# Patient Record
Sex: Male | Born: 1945 | Race: White | Hispanic: No | Marital: Married | Smoking: Former smoker
Health system: Southern US, Community
[De-identification: ages and names within clinical notes are randomized; demographics above are authoritative.]

## PROBLEM LIST (undated history)

## (undated) DIAGNOSIS — H409 Unspecified glaucoma: Secondary | ICD-10-CM

## (undated) DIAGNOSIS — Z8679 Personal history of other diseases of the circulatory system: Secondary | ICD-10-CM

## (undated) DIAGNOSIS — F419 Anxiety disorder, unspecified: Secondary | ICD-10-CM

## (undated) DIAGNOSIS — I10 Essential (primary) hypertension: Secondary | ICD-10-CM

## (undated) DIAGNOSIS — M1990 Unspecified osteoarthritis, unspecified site: Secondary | ICD-10-CM

## (undated) HISTORY — DX: Unspecified glaucoma: H40.9

## (undated) HISTORY — DX: Personal history of other diseases of the circulatory system: Z86.79

## (undated) HISTORY — DX: Unspecified osteoarthritis, unspecified site: M19.90

---

## 1998-07-23 ENCOUNTER — Emergency Department (HOSPITAL_COMMUNITY): Admission: EM | Admit: 1998-07-23 | Discharge: 1998-07-23 | Payer: Self-pay | Admitting: Ophthalmology

## 1998-07-23 ENCOUNTER — Encounter: Payer: Self-pay | Admitting: Emergency Medicine

## 1998-08-06 ENCOUNTER — Ambulatory Visit (HOSPITAL_COMMUNITY): Admission: RE | Admit: 1998-08-06 | Discharge: 1998-08-06 | Payer: Self-pay | Admitting: Family Medicine

## 2001-06-12 ENCOUNTER — Emergency Department (HOSPITAL_COMMUNITY): Admission: EM | Admit: 2001-06-12 | Discharge: 2001-06-12 | Payer: Self-pay

## 2001-12-18 ENCOUNTER — Inpatient Hospital Stay (HOSPITAL_COMMUNITY): Admission: EM | Admit: 2001-12-18 | Discharge: 2001-12-19 | Payer: Self-pay | Admitting: Emergency Medicine

## 2001-12-18 ENCOUNTER — Encounter: Payer: Self-pay | Admitting: Emergency Medicine

## 2002-01-02 ENCOUNTER — Encounter: Admission: RE | Admit: 2002-01-02 | Discharge: 2002-01-02 | Payer: Self-pay | Admitting: Family Medicine

## 2002-04-04 HISTORY — PX: KNEE ARTHROSCOPY: SUR90

## 2002-04-04 HISTORY — PX: RETINAL DETACHMENT SURGERY: SHX105

## 2002-08-25 ENCOUNTER — Emergency Department (HOSPITAL_COMMUNITY): Admission: EM | Admit: 2002-08-25 | Discharge: 2002-08-25 | Payer: Self-pay | Admitting: Emergency Medicine

## 2002-08-26 ENCOUNTER — Encounter: Payer: Self-pay | Admitting: Ophthalmology

## 2002-08-26 ENCOUNTER — Ambulatory Visit (HOSPITAL_COMMUNITY): Admission: RE | Admit: 2002-08-26 | Discharge: 2002-08-27 | Payer: Self-pay | Admitting: Otolaryngology

## 2002-09-03 ENCOUNTER — Ambulatory Visit (HOSPITAL_COMMUNITY): Admission: RE | Admit: 2002-09-03 | Discharge: 2002-09-04 | Payer: Self-pay | Admitting: Ophthalmology

## 2004-04-08 ENCOUNTER — Emergency Department (HOSPITAL_COMMUNITY): Admission: EM | Admit: 2004-04-08 | Discharge: 2004-04-08 | Payer: Self-pay | Admitting: *Deleted

## 2004-06-18 ENCOUNTER — Ambulatory Visit (HOSPITAL_COMMUNITY): Admission: RE | Admit: 2004-06-18 | Discharge: 2004-06-18 | Payer: Self-pay | Admitting: Gastroenterology

## 2007-10-11 ENCOUNTER — Ambulatory Visit: Payer: Self-pay | Admitting: Cardiology

## 2007-10-12 ENCOUNTER — Encounter (INDEPENDENT_AMBULATORY_CARE_PROVIDER_SITE_OTHER): Payer: Self-pay | Admitting: Internal Medicine

## 2007-10-12 ENCOUNTER — Observation Stay (HOSPITAL_COMMUNITY): Admission: EM | Admit: 2007-10-12 | Discharge: 2007-10-12 | Payer: Self-pay | Admitting: Emergency Medicine

## 2007-10-12 ENCOUNTER — Ambulatory Visit: Payer: Self-pay | Admitting: Surgery

## 2009-06-05 ENCOUNTER — Emergency Department (HOSPITAL_COMMUNITY): Admission: EM | Admit: 2009-06-05 | Discharge: 2009-06-06 | Payer: Self-pay | Admitting: Emergency Medicine

## 2010-08-17 NOTE — H&P (Signed)
NAME:  Wesley Fowler, Wesley Fowler NO.:  [id number]   MEDICAL RECORD NO.:  [id number]          PATIENT TYPE:  OBV   LOCATION:  6533                         FACILITY:  [facility]   PHYSICIAN:  Herbie Saxon, MDDATE OF BIRTH:  10/11/1945   DATE OF ADMISSION:  10/11/2007  DATE OF DISCHARGE:                              HISTORY & PHYSICAL   PRIMARY CARE PHYSICIAN:  Ernestina Penna, MD.   He has no designated health care power of attorney.  He is a full code.   PRESENTING COMPLAINT:  Dizziness and weakness 1 day.   HISTORY OF PRESENTING COMPLAINT:  This is a 65 year old male with no  significant past medical history except that he chews tobacco who was  quite well until 11 a.m. today when he started feeling weak and dizzy.  The symptoms started at rest and the patient had a meal and laid on his  couch to get some relief.  However, the symptoms recurred at 4 p.m.  associated with shortness of breath and palpitations.  He denies any  subjective chest pain.  He felt very lightheaded and almost passed out,  but he never had any syncopal episode.  The patient denies any abdominal  pain, chest trauma.  No wheezing or cough.  No hemoptysis.  The patient  denies any dysuria, hematuria, or frequency of urination.  There is no  abdominal pain, diarrhea, nausea, or vomiting.  He denies any  diaphoresis.  The patient has not had any episodes of chest pain or  shortness of breath previous to this.  He is extremely anxious.  He got  some relief after the EMS started IV fluid on him.  There was no  headache, no seizure activity.  No new skin rash or joint swelling.   PAST MEDICAL HISTORY:  None.   PAST SURGICAL HISTORY:  Left eye retina detachment and right knee  arthroscopic surgery.   FAMILY HISTORY:  Grandfather had diabetes.  His father died of a  cerebral aneurysm.   SOCIAL HISTORY:  He is married with 3 children.  He does not drink  alcohol.  He occasionally chews tobacco.  No  use of illicit drugs.   REVIEW OF SYSTEMS:  Fourteen systems are reviewed, pertinent positive as  in history of presenting complaint.   ALLERGIES:  SULFA.   MEDICATIONS:  Aspirin 81 mg daily.   PHYSICAL EXAMINATION:  GENERAL:  This is an elderly man not in acute  distress.  VITAL SIGNS:  Temperature is 98, pulse is 80, respiratory rate is 20,  and blood pressure is 115/72.  HEENT:  Pupils are equal, reactive to light and accommodation.  Head is  atraumatic and normocephalic.  Mucous membranes are moist.  Oropharynx  and nasopharynx are clear.  NECK:  Supple.  There is no elevated JVD.  No thyromegaly or carotid  bruit.  CHEST:  There is no tenderness.  No chest wall deformity.  Bilateral air  entry is good.  No rales or rhonchi.  HEART:  Apex beat is in fifth intercostal space, midclavicular line.  There is no parasternal heave.  No murmurs, rubs, or gallops.  Heart  sounds 1 and 2.  Regular rate and rhythm.  ABDOMEN:  Soft and nontender.  No organomegaly.  Inguinal orifices are  patent.  NEUROLOGIC:  He is alert and oriented to time, place, and person.  Cranial nerves II-XII are intact.  Power is 5 globally.  Deep tendon  reflexes are 2 globally.  Sensory system normal.  Peripheral pulses are  present.  No pedal edema.   LABORATORY DATA:  Lab tests show WBC of 5.5, hematocrit 37.9, and  platelet count 176.  Troponin less than 0.05.  INR is 1.1, PTT 31.  The  D-dimer is less than 0.22.  Urinalysis negative.  Chemistry shows a  sodium of 137, potassium 4.3, chloride 101, BUN 7, creatinine 1.1, and  glucose 89.  The chest x-ray is negative.   ASSESSMENT:  1. Presyncope.  2. Query acute coronary syndrome.  3. History of tobacco abuse.  4. Family history of cerebral aneurysm.   The patient is kept on observation telemetry over the next 23 hours.  We  will get serial cardiac enzymes and EKG every 8 hours x3.  If this is  negative, the patient might benefit from stress test in  the morning,  possibly a Cardiology evaluation.  He has to be n.p.o. after midnight.  IV fluid with half normal saline at 20 mL an hour to keep the vein open.  Diet regular as tolerated.  Put him on Lovenox 40 mg subcu daily for DVT  prophylaxis, Protonix 30 mg IV daily, Xanax 0.5 mg b.i.d. for anxiety,  morphine 2 mg IV every 6 hours p.r.n. if chest pain ensues,  nitroglycerin 0.4 mg sublingually p.r.n. every 5 minutes x3, and aspirin  325 mg daily.  We will check his Hemoccult x2.  Obtain his fasting  lipid, homocystine, and thyroid function test.  Atrovent and Proventil  every 6 hours p.r.n. and Phenergan 25 mg IV every 8 hours p.r.n.  Obtain  a CT brain with noncontrast, 2 to 4 liter nasal cannula every  shifts to  keep sats greater than 90% and blood pressure greater than 160/110.  We  will start him on Lopressor 2.5 mg IV every 6 hours p.r.n.  Also, we  will get a carotid Doppler.      Herbie Saxon, MD  Electronically Signed     MIO/MEDQ  D:  10/11/2007  T:  10/12/2007  Job:  [id number]   cc:   Ernestina Penna, M.D.

## 2010-08-20 NOTE — H&P (Signed)
NAME:  Wesley Fowler, Wesley Fowler                          ACCOUNT NO.:  [id number]   MEDICAL RECORD NO.:  [id number]                   PATIENT TYPE:  INP   LOCATION:  1856                                 FACILITY:  [facility]   PHYSICIAN:  Leighton Roach McDiarmid, M.D.             DATE OF BIRTH:  [birth year]   DATE OF ADMISSION:  12/18/2001  DATE OF DISCHARGE:                                HISTORY & PHYSICAL   CHIEF COMPLAINT:  Chest pressure earlier today.   HISTORY OF PRESENT ILLNESS:  Mr. Wesley Fowler is a 65 year old male with a  history of hypertension who experienced mild chest pain prior to  presentation today. Pain onset at 15:30 hours acutely. It was a constant  pressure 2/10. No radiation. The patient was at rest when pain began. He  took some Nexium thinking it was heartburn. The patient's discomfort  resolved about 20 minutes after taking the Nexium. About 90 minutes later,  the patient experienced vague discomfort at the right antecubital fossa. The  patient was not concerned about the chest pain, but in view of the nausea  and light-headedness that he experienced earlier in the day, the patient  called his primary care physician. He referred him to [facility]. The patient denies shortness of breath with no palpitation,  positive diaphoresis that was temporarily unrelated to his discomfort. He  took his temperature and it was 98.6 degrees p.o. The patient had a 50 pack-  year smoking history. Positive remote family history of cardiovascular  disease. He has a history of a negative carotid ultrasound in the recent  past. There is a question of possible TIA in April of 2000. The discomfort,  he states, is typical from the typical reflux pain that he experiences. He  denies orthopnea, PND, dyspnea on exertion or lower extremity edema.   PAST MEDICAL HISTORY:  Hypertension x 5 years, hiatal hernia, questionable  TIA in April of 2000.   PAST SURGICAL HISTORY:  None.   MEDICATIONS:  Doxazosin mesylate 4 mg q.h.s., aspirin 325 mg x 2, Nexium  p.r.n.   DRUG ALLERGIES:  SULFA.   FAMILY HISTORY:  His grandfather experienced an MI in his early 80s, which  was nonfatal. Otherwise, noncontributory.   SOCIAL HISTORY:  The patient lives with his wife of 32 years. He has 2  daughters. He was previously employed by the [employer] company. He currently  farms tobacco. He drinks 6 to 10 beers a night. He states that he  experienced no withdrawal symptoms when he stops drinking alcohol for a few  days. He has a 50 pack-year smoking history. He quit in 1982. He does  continue to chew his tobacco.   REVIEW OF SYMPTOMS:  GENERAL: No weight change, sleeps well. HEENT:  Questionable right cataract, significantly decreased vision on the right  side. Good dentition. GI: No dysphagia. No nausea, vomiting, diarrhea,  constipation. No bright red  blood per rectum. No melena. No abdominal pain.  GU: Nocturia 2 to 3 times a night after drinking 2 to 3 glasses of water  prior to going to bed.  No BPH. CARDIOVASCULAR: Occasional chest pain, lasts  less than a minute, occurs at activity or rest. No associated symptoms. No  palpitations. RESPIRATORY: No shortness of breath, no cough, no hemoptysis.  MUSCULOSKELETAL: No fracture, no joint pain, redness or edema. NEUROLOGIC:  History of left leg numbness in April of 2000 resolved. A diagnosis was  given as a TIA.   PHYSICAL EXAMINATION:  GENERAL: The patient reclining in bed surrounded by  family in no apparent distress.  VITAL SIGNS: Temperature 98.9 degrees, respiratory rate 16,  heart rate 101,  blood pressure 158/101.  HEENT: Normocephalic, atraumatic. Pupils equal, round and reactive to light.  Ophthalmic exam: No opacity noted. Good red reflex bilaterally. No AV  nicking. No sclerosed blood vessels. Mucosal membranes pink and moist.  NECK: No palpable lymph nodes, no masses, no thyromegaly, no JVD.  CARDIOVASCULAR: Normal  precordium. PMI not displaced. No heave. No thrill.  S1, S2, regular rate and rhythm. No ectopy appreciated, 2/2 peripheral  pulses, symmetric.  LUNGS: Good air exchange, equal excursion, clear to auscultation  bilaterally. No crackles, wheezes or rhonchi.  ABDOMEN: Positive bowel sounds, soft, nontender, nondistended. No mass, no  hepatosplenomegaly appreciated.  EXTREMITIES: No pedal edema. Moving all 4 extremities.  NEUROLOGIC: Alert and oriented x 3. No paresthesias. DTR's are 2+ x 4.  Strength 5/5 x 4.  SKIN: There is a 3 mm hyperpigmented lesion in mid back, brown and black  with smooth border.   LABS:  Sodium 134, potassium 4.4, chloride 103, bicarb 31, BUN 7, creatinine  1.2, glucose 101, ABG pH 7.39, PCO2 48, bicarb 29, white blood cell count  4.8, hemoglobin 16.5, hematocrit 47.8, platelets 168, CK 69, CK-MB 1.2,  troponin 0.02, ECG: Normal sinus rhythm.   ASSESSMENT AND PLAN:  This is a 65 year old, male with a history of  hypertension who presents after an episode of atypical chest pain earlier  today, apparently asymptomatic in stable condition.   1. Chest pain. The patient has the following risk factors for cardiovascular     disease. He has now reported increased LDL, hypertension, questionable     history of TIA, 50 pack-year of tobacco history and alcohol. He has     reported high HDL. ECG and cardiac markers are negative. A chest x-ray is     pending. We will evaluate serial cardiac markers. We will start Lopressor     25 mg and continue doxazocin 4 mg. Continue aspirin. We will further risk     stratify the patient by obtaining fasting lipid profile and TSH in the     a.m. Chest pain is not reproducible, therefore, unlikely musculoskeletal     in nature. There is no shortness of breath or hypoxia, therefore,     pulmonary embolism is less likely.  2. Hypertension. Not adequately controlled. May be elevated secondary to    stress. The patient is also tachycardic. We  will add metoprolol and     monitor.  3. We will admit patient for further evaluation.     Barney Drain, MD                         Leighton Roach McDiarmid, M.D.    SS/MEDQ  D:  12/18/2001  T:  12/19/2001  Job:  [id number]

## 2010-08-20 NOTE — Op Note (Signed)
Wesley Fowler, Wesley Fowler                ACCOUNT NO.:  [id number]   MEDICAL RECORD NO.:  [id number]          PATIENT TYPE:  AMB   LOCATION:  ENDO                         FACILITY:  [facility]   PHYSICIAN:  John C. Madilyn Fireman, M.D.    DATE OF BIRTH:  [birth year]   DATE OF PROCEDURE:  06/18/2004  DATE OF DISCHARGE:                                 OPERATIVE REPORT   PROCEDURE:  Colonoscopy.   INDICATIONS FOR PROCEDURE:  Average risk colon cancer screening.   PROCEDURE:  The patient was placed in the left lateral decubitus position  then placed on the pulse monitor with continuous low-flow oxygen delivered  by nasal cannula. He was sedated 50 mcg IV fentanyl and 6 mg IV Versed. The  Olympus video colonoscope was inserted into the rectum and advanced to the  cecum, confirmed by transillumination at McBurney's point visualization of  ileocecal valve and appendiceal orifice, the prep was excellent. The cecum,  ascending, transverse, descending and sigmoid colon all appeared normal. No  masses, polyps, diverticula or other mucosal abnormalities. The rectum  likewise appeared normal. Retroflexed view of the anus revealed no obvious  internal hemorrhoids. The scope was then withdrawn and the patient returned  to the recovery room in stable condition. He tolerated the procedure well  and there were no immediate complications.   IMPRESSION:  Normal colonoscopy.   PLAN:  Next colon screening by colonoscopy within 10 years and consider  sigmoidoscopy in five years.      JCH/MEDQ  D:  06/18/2004  T:  06/18/2004  Job:  [id number]   cc:   Ernestina Penna, M.D.  [address] [place]  [place] [address]  Fax: [phone]

## 2010-08-20 NOTE — Op Note (Signed)
   NAME:  Wesley Fowler, Wesley Fowler                          ACCOUNT NO.:  [id number]   MEDICAL RECORD NO.:  [id number]                   PATIENT TYPE:  OIB   LOCATION:  5714                                 FACILITY:  [facility]   PHYSICIAN:  Beulah Gandy. Ashley Royalty, M.D.              DATE OF BIRTH:  [birth year]   DATE OF PROCEDURE:  08/26/2002  DATE OF DISCHARGE:                                 OPERATIVE REPORT   PREOPERATIVE DIAGNOSIS:  Rhegmatogenous retinal detachment, right eye.   POSTOPERATIVE DIAGNOSIS:  Rhegmatogenous retinal detachment, right eye.   OPERATION PERFORMED:  1. Scleral buckle, right eye.  2. Gas-fluid exchange, right eye.  3. Retinal photocoagulation, right eye.   SURGEON:  Beulah Gandy. Ashley Royalty, M.D.   ASSISTANT:  Bryan Lemma. Lundquist, P.A.   ANESTHESIA:  General.   DESCRIPTION OF PROCEDURE:  Usual prep and drape, 360 degree limbal peritomy,  isolation of four rectus muscles on 2-0 silk.  Localization of break at 12  o'clock.  Scleral dissection from 9 o'clock to 4 o'clock to admit a #279  intrascleral implant.  Diathermy placed in the bed.  A 240 band placed in  the eye with belt loops at 6 and 8 o'clock and a 270 sleeve at 2 o'clock.  Perforation site chosen at 11:30 o'clock and 12 o'clock to released clear,  colorless subretinal fluid.  508G radial segment was placed beneath the  buckle at this 12 o'clock to support the break.  C3F8 100% 0.51mL was  injected into the 10 o'clock pars plana.  Indirect ophthalmoscopy showed the  retina to be lying nicely on the buckle with minimal subretinal fluid  remaining.  The indirect ophthalmoscope laser was moved into place and 673  burns placed around the retinal periphery with a power of 800 mw, 1000  microns each and 0.1 second each.  The buckle was adjusted and trimmed.  The  scleral flaps were closed with 4-0 Mersilene sutures.  These sutures were  trimmed.  The band was adjusted and trimmed.  The conjunctiva was reposited  with 7-0  chromic suture.  Polymyxin and gentamicin were irrigated into  Tenon's space.  Atropine solution was applied.  Decadron 10 mg was injected  into the lower subconjunctival space.  Marcaine was injected around the  globe for postoperative pain.  Polysporin, a patch and shield were placed.  The patient was awakened and taken to recovery in satisfactory condition.                                                Beulah Gandy. Ashley Royalty, M.D.    JDM/MEDQ  D:  08/26/2002  T:  08/27/2002  Job:  [id number]

## 2010-08-20 NOTE — Discharge Summary (Signed)
NAME:  Wesley Fowler, Wesley Fowler                          ACCOUNT NO.:  [id number]   MEDICAL RECORD NO.:  [id number]                   PATIENT TYPE:  INP   LOCATION:  3730                                 FACILITY:  [facility]   PHYSICIAN:  Leighton Roach McDiarmid, M.D.             DATE OF BIRTH:  08/30/1945   DATE OF ADMISSION:  12/18/2001  DATE OF DISCHARGE:  12/19/2001                                 DISCHARGE SUMMARY   DISCHARGE DIAGNOSIS:  1. Atypical chest pain.  2. Hypertension.   DISCHARGE MEDICATIONS:  1. Doxazosin4 mg p.o. q.d.  2. Metoprolol 25 mg p.o. q.d.  3. Aspirin 325 mg p.o. q.d.   PROCEDURES:  Two-view chest x-ray which revealed no acute abnormalities and  some COPD changes.   CONSULTATIONS:  None.   HISTORY AND PHYSICAL:  The patient is a 65 year old white male with a  history of hypertension who experienced some mild chest pain prior to  presentation to [facility].  This pain began at  approximately 3:30 and was acute in onset.  He describes it as constant  pressure at about 2/10 without any radiation.  He reports he took some  Nexium, and the pain resolved approximately 20 minutes later.  However,  about 90 minutes after experiencing the pain, there was some discomfort on  his right antecubital fossa. He did report some nausea and lightheadedness  which had been present earlier in the day, but at this time he was concerned  enough to call his primary care physician who referred him to [facility].  He does report a 50-pack year history of smoking which was  remote.   HOSPITAL COURSE:  1. CHEST PAIN:  The patient did present with an atypical chest pain type     picture.  Upon admission, he was found to be hypertensive at 158/101 and     tachycardic with a heart rate of 101.  However, the rest of his exam was     essentially normal.  He was admitted to hospital to rule out myocardial     infarction.  While in the hospital, he obtained three sets  of cardiac     enzymes which were negative for myocardial ischemia.  He had an EKG which     revealed normal sinus rhythm and no acute abnormalities. He was also     admitted to telemetry and experienced no further episodes of chest pain     or worrisome rhythms while in hospital.  A fasting lipid panel was     obtained which revealed a total cholesterol of 214 with an LDL of 86 and     an HDL of 107.  His triglyceride level was 103.  Consequently as the     patient has been ruled out for myocardial infarction, he will be     discharged to follow up with his primary care physician to  schedule an     exercise stress test within the next month.   1. HYPERTENSION:  The patient presented with an elevated blood pressure of     158/101 and tachycardic at 101.  He had been started on Cardura as an     outpatient for hypertension with additional hopes in improvement in his     nocturia. While in hospital, he was begun on Lopressor 25 mg p.o. q.d.     and experienced improvement in his blood pressure during his stay.  He     will be discharged on the medications as listed above.  Further     modifications in his medical regimen can be made as an outpatient by his     primary care physician.  An additional agent that could be used in the     future would be hydrochlorothiazide.   FOLLOWUP LABORATORY DATA:  There is a TSH level pending at the time of  discharge.   FOLLOW UP:  [facility].  The patient was advised to  call the office on the day after discharge to schedule a followup  appointment and an exercise stress test within one month.  An attempt was  made at scheduling the appointments while in hospital, but [facility]'s computer system was done.      Rodolph Bong, MD                          Leighton Roach McDiarmid, M.D.    AK/MEDQ  D:  12/19/2001  T:  12/21/2001  Job:  [id number]   cc:   Ernestina Penna, M.D.  [address] [place]  [place] [address]  Fax:  [phone]

## 2010-08-20 NOTE — Op Note (Signed)
NAME:  Wesley Fowler, Wesley Fowler                          ACCOUNT NO.:  [id number]   MEDICAL RECORD NO.:  [id number]                   PATIENT TYPE:  OIB   LOCATION:  2899                                 FACILITY:  [facility]   PHYSICIAN:  Beulah Gandy. Ashley Royalty, M.D.              DATE OF BIRTH:  1945-08-21   DATE OF PROCEDURE:  09/03/2002  DATE OF DISCHARGE:                                 OPERATIVE REPORT   PREOPERATIVE DIAGNOSIS:  Recurrent rhegmatogenous with retinal detachment,  right eye.   PROCEDURE:  Scleral buckle #2 with pars plana vitrectomy, Perfluoron  injection, perfluoropropane 15% injection and membrane peel.   SURGEON:  Beulah Gandy. Ashley Royalty, M.D.   ASSISTANT:  Merian Capron, MA.   ANESTHESIA:  General.   DESCRIPTION OF PROCEDURE:  After the usual prep and drape, a 360 degree  Lindvall peritomy, isolation of four rectus muscles on 3-0 silk,  localization of breaks at 6:00, scleral dissection from 3:00 to 10:00 to  admit a #279 intrascleral implant.  Diathermy placed in the bed.  A #279  implant was placed in this location for a total circumference of 360 degrees  of 279 implant.  The scleral sutures were placed in the scleral flaps, two  per quadrant.  Five scleral sutures were drawn securely and gas was released  as the sclera was indented with scleral buckle. Attention was then carried  to the pars plana where the 6 mm infusion port was anchored into place at  8:00.  The lighted pick and the cutter were placed at 10:00 and 2:00,  respectively.  Amvisc was placed on the corneal surface.  The pars plana  vitrectomy was begun just behind the pseudophakos.  The lighted pick was at  10:00 and the cutter was at 2:00.  Dense fibrotic vitreous was encountered  pulling on the retinal surface.  This was trimmed away carefully under low  suction and rapid cutting. The vitreous base was trimmed for 360 degrees and  all of this inflamed cellular vitreous was carefully removed.  Once this was  accomplished, Perfluoron was injected over the optic nerve head to reattach  the retina.  A full reattachment of the retina was obtained.  At this point  the Endolaser was placed in the eye and 520 burns were placed around  multiple retinal breaks at 6:00.  The power was 800 milliwatts, 1000 microns  each and 0.1 seconds each.  The laser was placed in different ports to  obtain optimum effect on the retina.  Once this was accomplished the gas  fluid exchange was carried out with removal of the Perfluoron and placement  of room air gas.  Perfluoropropane 15% was then exchanged for room air in  the vitreous cavity.  All Perfluoron was removed. The instruments were  removed from the eye and 9-0 nylon was used to close the sclerotomy sites.  The scleral buckle was trimmed.  The sutures were knotted and trimmed.  The  conjunctiva was reposited with 7-0 chromic suture.  Polymixin and gentamycin  were irrigated into Tenon's space.  Atropine solution was avoided.  There  was no Atropine used.  Marcaine was injected around the globe for  postoperative pain.  Decadron 10 mg was injected into the lower  subconjunctival space.  The closing tension was 10 with the [facility].  Polysporin, a patch and shield were placed.  The patient was  awakened and taken to recovery in satisfactory condition in the face down  position.  Complications were none.  Duration was two hours.                                               Beulah Gandy. Ashley Royalty, M.D.    JDM/MEDQ  D:  09/03/2002  T:  09/03/2002  Job:  [id number]

## 2010-08-20 NOTE — Discharge Summary (Signed)
[name], [name] NO.:  [id number]   MEDICAL RECORD NO.:  [id number]          PATIENT TYPE:  OBV   LOCATION:  6533                         FACILITY:  [facility]   PHYSICIAN:  Hillery Aldo, M.D.   DATE OF BIRTH:  March 17, 1946   DATE OF ADMISSION:  10/11/2007  DATE OF DISCHARGE:  10/12/2007                               DISCHARGE SUMMARY   PRIMARY CARE PHYSICIAN:  Ernestina Penna, MD   DISCHARGE DIAGNOSES:  1. Presyncope.  2. Tobacco abuse.  3. Anxiety   DISCHARGE MEDICATION:  Aspirin 81 mg daily.   CONSULTATIONS:  None.   BRIEF ADMISSION HISTORY OF PRESENT ILLNESS:  The patient is a 65-year-  old male who presented to the hospital with chief complaint of dizziness  and weakness for 1-day's time.  For full details, please see the  dictated report done by Dr. Christella Noa.   PROCEDURES AND DIAGNOSTIC STUDIES:  1. Chest x-ray on October 11, 2007, showed no abnormalities.  2. CT scan of the head on October 12, 2007, showed no evidence of acute      intracranial abnormality.  3. Carotid Dopplers on October 12, 2007, showed mild intimal wall changes      of the common carotid artery bilaterally with high bifurcations and      no obvious evidence of plaque.  Tubal artery flow was antegrade      bilaterally.  No significant ICA stenosis in the right or left      arteries noted.  4. A 2-dimensional echocardiogram done on October 12, 2007, showed normal      left ventricular systolic function with an ejection fraction      estimated 55%.  There were left ventricular regional wall motion      abnormalities.  Aortic valve thickness was mildly increased.  There      was mild aortic valvular regurgitation.  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   DISCHARGE LABORATORY VALUES:  TSH was normal.  Chemistries were notable  only for slightly low potassium of 3.3.  Total cholesterol was 156,  triglycerides 56, HDL 61, and LDL 84.  Cardiac markers were  negative x3  sets.  CBC was normal.   HOSPITAL COURSE:  1. Presyncope with dizziness:  The patient was admitted and diagnostic      workup was undertaken with findings as noted above.  His symptoms      completely resolved and were attributed to possible heat exposure.      He is advised to follow up with his primary care physician if      symptoms return.  2. Tobacco abuse:  The patient underwent tobacco cessation counseling      and was not certain of his ability to quit.      Nevertheless, he was provided with education and contact      information should he wants to pursue a formal tobacco cessation      class.   DISPOSITION:  The patient was deemed medically stable for discharge  after diagnostic workup was  negative.      Hillery Aldo, M.D.  Electronically Signed     CR/MEDQ  D:  11/13/2007  T:  11/14/2007  Job:  [id number]   cc:   Ernestina Penna, M.D.

## 2010-08-23 ENCOUNTER — Emergency Department (HOSPITAL_COMMUNITY)
Admission: EM | Admit: 2010-08-23 | Discharge: 2010-08-23 | Disposition: A | Discharge status: Home / Self Care | Payer: BC Managed Care – PPO | Attending: Emergency Medicine | Admitting: Emergency Medicine

## 2010-08-23 ENCOUNTER — Emergency Department (HOSPITAL_COMMUNITY): Payer: BC Managed Care – PPO

## 2010-08-23 DIAGNOSIS — R0602 Shortness of breath: Secondary | ICD-10-CM | POA: Insufficient documentation

## 2010-08-23 DIAGNOSIS — R110 Nausea: Secondary | ICD-10-CM | POA: Insufficient documentation

## 2010-08-23 LAB — CBC
Hemoglobin: 13.3 g/dL (ref 13.0–17.0)
MCH: 31.7 pg (ref 26.0–34.0)
MCHC: 36.5 g/dL — ABNORMAL HIGH (ref 30.0–36.0)
RDW: 11.9 % (ref 11.5–15.5)

## 2010-08-23 LAB — BASIC METABOLIC PANEL
CO2: 27 mEq/L (ref 19–32)
Calcium: 9.1 mg/dL (ref 8.4–10.5)
Chloride: 97 mEq/L (ref 96–112)
Creatinine, Ser: 0.73 mg/dL (ref 0.4–1.5)
GFR calc Af Amer: >60 mL/min (ref >60)
Glucose, Bld: 103 mg/dL — ABNORMAL HIGH (ref 70–99)

## 2010-08-23 LAB — POCT CARDIAC MARKERS: Myoglobin, poc: 60.5 ng/mL (ref 12–200)

## 2010-12-30 LAB — POCT CARDIAC MARKERS
CKMB, poc: 1
Myoglobin, poc: 60.3
Troponin i, poc: <0.05

## 2010-12-30 LAB — POCT I-STAT, CHEM 8
BUN: 7
Chloride: 101
Creatinine, Ser: 1.1
Glucose, Bld: 89
Hemoglobin: 12.6 — ABNORMAL LOW
Potassium: 4.3
Sodium: 137

## 2010-12-30 LAB — DIFFERENTIAL
Basophils Relative: 0
Eosinophils Absolute: 0.1
Eosinophils Relative: 2
Lymphs Abs: 1.2
Monocytes Relative: 7

## 2010-12-30 LAB — CBC
HCT: 37.9 — ABNORMAL LOW
Hemoglobin: 13.3
MCHC: 34.8
MCHC: 34.8
MCV: 92.9
Platelets: 189
RBC: 4.08 — ABNORMAL LOW
RDW: 12.6
WBC: 5.5

## 2010-12-30 LAB — URINALYSIS, ROUTINE W REFLEX MICROSCOPIC
Bilirubin Urine: NEGATIVE
Glucose, UA: NEGATIVE
Hgb urine dipstick: NEGATIVE
Protein, ur: NEGATIVE
Specific Gravity, Urine: 1.003 — ABNORMAL LOW

## 2010-12-30 LAB — CARDIAC PANEL(CRET KIN+CKTOT+MB+TROPI)
CK, MB: 3.1
Relative Index: INVALID
Troponin I: 0.01
Troponin I: 0.01

## 2010-12-30 LAB — LIPID PANEL
HDL: 61
Total CHOL/HDL Ratio: 2.6

## 2010-12-30 LAB — BASIC METABOLIC PANEL
Calcium: 9.3
GFR calc Af Amer: >60
GFR calc non Af Amer: >60
Glucose, Bld: 135 — ABNORMAL HIGH
Sodium: 143

## 2010-12-30 LAB — TSH: TSH: 2.587

## 2011-01-19 ENCOUNTER — Encounter (INDEPENDENT_AMBULATORY_CARE_PROVIDER_SITE_OTHER): Payer: BC Managed Care – PPO | Admitting: Ophthalmology

## 2011-01-19 DIAGNOSIS — H2510 Age-related nuclear cataract, unspecified eye: Secondary | ICD-10-CM

## 2011-01-19 DIAGNOSIS — H3530 Unspecified macular degeneration: Secondary | ICD-10-CM

## 2011-01-19 DIAGNOSIS — H33009 Unspecified retinal detachment with retinal break, unspecified eye: Secondary | ICD-10-CM

## 2011-01-19 DIAGNOSIS — H43819 Vitreous degeneration, unspecified eye: Secondary | ICD-10-CM

## 2011-02-02 ENCOUNTER — Encounter (INDEPENDENT_AMBULATORY_CARE_PROVIDER_SITE_OTHER): Payer: BC Managed Care – PPO | Admitting: Ophthalmology

## 2011-03-04 ENCOUNTER — Ambulatory Visit (INDEPENDENT_AMBULATORY_CARE_PROVIDER_SITE_OTHER): Payer: BC Managed Care – PPO | Admitting: General Surgery

## 2011-03-04 ENCOUNTER — Encounter (INDEPENDENT_AMBULATORY_CARE_PROVIDER_SITE_OTHER): Payer: Self-pay | Admitting: General Surgery

## 2011-03-04 VITALS — BP 124/88 | HR 68 | Temp 97.6°F | Resp 12 | Ht 69.0 in | Wt 129.8 lb

## 2011-03-04 DIAGNOSIS — K4020 Bilateral inguinal hernia, without obstruction or gangrene, not specified as recurrent: Secondary | ICD-10-CM

## 2011-03-04 MED ORDER — TAMSULOSIN HCL 0.4 MG PO CAPS: 0.4000 mg | ORAL_CAPSULE | Freq: Every day | ORAL | Status: DC

## 2011-03-04 NOTE — Progress Notes (Signed)
Patient ID: Wesley Fowler, male   DOB: 13-Jan-1946, 65 y.o.   MRN: [id number]  Chief Complaint  Patient presents with  . Other    new pt- eval [facility]    HPI Wesley Fowler is a 65 y.o. male.   HPI 65 year old Caucasian male referred by Dr.Wong for evaluation of bilateral inguinal hernias. The patient was lifting something heavy about a week and a half ago when he subsequently felt something pull. He then noticed a lump in both groins. He complains of bilateral lower groin discomfort. He denies any fevers, chills, nausea, vomiting, diarrhea, or constipation. He does get up in the middle of the night to urinate. He states that his urine stream is a little bit weaker than it used to be. 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 He denies any numbness or tingling in his groin.  Past Medical History  Diagnosis Date  . History of high blood pressure   . Arthritis     Past Surgical History  Procedure Date  . Retinal detachment surgery 2004    right  . Knee arthroscopy 2004    right    History reviewed. No pertinent family history.  Social History History  Substance Use Topics  . Smoking status: Former Games developer  . Smokeless tobacco: Not on file   Comment: quit 1982  . Alcohol Use: No    Allergies  Allergen Reactions  . Sulfa Antibiotics     Current Outpatient Prescriptions  Medication Sig Dispense Refill  . buPROPion (WELLBUTRIN SR) 150 MG 12 hr tablet Take 150 mg by mouth daily.        . Cyanocobalamin (VITAMIN B-12 CR PO) Take by mouth daily.        . diazepam (VALIUM) 2 MG tablet Take 2 mg by mouth every 6 (six) hours as needed.        . fish oil-omega-3 fatty acids 1000 MG capsule Take 2 g by mouth daily.        . Misc Natural Products (OSTEO BI-FLEX JOINT SHIELD PO) Take by mouth daily.          Review of Systems Review of Systems  Constitutional: Negative for fever, chills, activity change, fatigue and unexpected weight change.  HENT: Negative for nosebleeds, neck  pain and neck stiffness.        +dentures  Eyes: Negative for photophobia and visual disturbance.       +glasses  Respiratory: Negative for chest tightness, shortness of breath and wheezing.   Cardiovascular: Negative for chest pain, palpitations and leg swelling.  Gastrointestinal: Negative for nausea, vomiting, diarrhea, constipation and blood in stool.  Genitourinary: Negative for dysuria, hematuria and testicular pain.       +nocturia, +weaker stream  Musculoskeletal:       +joint pain  Neurological: Negative.   Hematological: Negative.   Psychiatric/Behavioral: Negative.     Blood pressure 124/88, pulse 68, temperature 97.6 F (36.4 C), temperature source Temporal, resp. rate 12, height 5\' 9"  (1.753 m), weight 129 lb 12.8 oz (58.877 kg).  Physical Exam Physical Exam  Vitals reviewed. Constitutional: He is oriented to person, place, and time. He appears well-developed and well-nourished. No distress.       thin  HENT:  Head: Normocephalic and atraumatic.  Eyes: Conjunctivae are normal. No scleral icterus.  Neck: Normal range of motion. Neck supple. No tracheal deviation present. No thyromegaly present.  Cardiovascular: Normal rate, regular rhythm, normal heart sounds and intact distal pulses.  Pulmonary/Chest: Effort normal and breath sounds normal. No respiratory distress. He has no wheezes.  Abdominal: Soft. Bowel sounds are normal. A hernia is present. Hernia confirmed positive in the right inguinal area and confirmed positive in the left inguinal area.  Genitourinary: Testes normal and penis normal.     Musculoskeletal: Normal range of motion. He exhibits no edema and no tenderness.  Lymphadenopathy:       Right: No inguinal adenopathy present.       Left: No inguinal adenopathy present.  Neurological: He is alert and oriented to person, place, and time. He exhibits normal muscle tone.  Skin: Skin is warm and dry. No erythema.  Psychiatric: He has a normal mood and  affect. His behavior is normal. Judgment and thought content normal.    Data Reviewed Referring provider's note  Assessment    Bilateral inguinal hernias    Plan    We discussed the etiology of inguinal hernias. We discussed the signs & symptoms of incarceration & strangulation.  We discussed non-operative and operative management.  The patient has elected to proceed with a Laparoscopic Bilateral Inguinal Hernia Repair with Mesh    I described the procedure in detail.  The patient was given educational material. We discussed the risks and benefits including but not limited to bleeding, infection, chronic inguinal pain, nerve entrapment, hernia recurrence, mesh complications, hematoma formation, urinary retention, injury to the testicles, numbness in the groin, blood clots, injury to the surrounding structures, and anesthesia risk. We also discussed the typical post operative recovery course, including no heavy lifting for 4-6 weeks. I explained that the likelihood of improvement of their symptoms is good.  Mary Sella. Andrey Campanile, MD, FACS General, Bariatric, & Minimally Invasive Surgery [facility], [place]         [facility] M 03/04/2011, 11:13 AM

## 2011-03-04 NOTE — Patient Instructions (Addendum)
I prescribed Flomax for you around the time of your hernia surgery to help decrease the risk of you retaining urine after surgery.  Start taking 1 pill once a day two days before your surgery. You will finish taking the remaining pills after surgery.   Hernia Repair with Laparoscope A hernia occurs when an internal organ pushes out through a weak spot in the belly (abdominal) wall muscles. Hernias most commonly occur in the groin and around the navel. Hernias can also occur through a cut by the surgeon (incision) after an abdominal operation. A hernia may be caused by:  Lifting heavy objects.   Prolonged coughing.   Straining to move your bowels.  Hernias can often be pushed back into place (reduced). Most hernias tend to get worse over time. Problems occur when abdominal contents get stuck in the opening and the blood supply is blocked or impaired (incarcerated hernia). Because of these risks, you require surgery to repair the hernia. Your hernia will be repaired using a laparoscope. Laparoscopic surgery is a type of minimally invasive surgery. It does not involve making a typical surgical cut (incision) in the skin. A laparoscope is a telescope-like rod and lens system. It is usually connected to a video camera and a light source so your caregiver can clearly see the operative area. The instruments are inserted through  to  inch (5 mm or 10 mm) openings in the skin at specific locations. A working and viewing space is created by blowing a small amount of carbon dioxide gas into the abdominal cavity. The abdomen is essentially blown up like a balloon (insufflated). This elevates the abdominal wall above the internal organs like a dome. The carbon dioxide gas is common to the human body and can be absorbed by tissue and removed by the respiratory system. Once the repair is completed, the small incisions will be closed with either stitches (sutures) or staples (just like a paper stapler only this  staple holds the skin together). LET YOUR CAREGIVERS KNOW ABOUT:  Allergies.   Medications taken including herbs, eye drops, over the counter medications, and creams.   Use of steroids (by mouth or creams).   Previous problems with anesthetics or Novocaine.   Possibility of pregnancy, if this applies.   History of blood clots (thrombophlebitis).   History of bleeding or blood problems.   Previous surgery.   Other health problems.  BEFORE THE PROCEDURE  Laparoscopy can be done either in a hospital or out-patient clinic. You may be given a mild sedative to help you relax before the procedure. Once in the operating room, you will be given a general anesthesia to make you sleep (unless you and your caregiver choose a different anesthetic).  AFTER THE PROCEDURE  After the procedure you will be watched in a recovery area. Depending on what type of hernia was repaired, you might be admitted to the hospital or you might go home the same day. With this procedure you may have less pain and scarring. This usually results in a quicker recovery and less risk of infection. HOME CARE INSTRUCTIONS   Bed rest is not required. You may continue your normal activities but avoid heavy lifting (more than 10 pounds) or straining.   Cough gently. If you are a smoker it is best to stop, as even the best hernia repair can break down with the continual strain of coughing.   Avoid driving until given the OK by your surgeon.   There are no dietary restrictions  unless given otherwise.   TAKE ALL MEDICATIONS AS DIRECTED.   Only take over-the-counter or prescription medicines for pain, discomfort, or fever as directed by your caregiver.  SEEK MEDICAL CARE IF:   There is increasing abdominal pain or pain in your incisions.   There is more bleeding from incisions, other than minimal spotting.   You feel light headed or faint.   You develop an unexplained fever, chills, and/or an oral temperature above  102 F (38.9 C).   You have redness, swelling, or increasing pain in the wound.   Pus coming from wound.   A foul smell coming from the wound or dressings.  SEEK IMMEDIATE MEDICAL CARE IF:   You develop a rash.   You have difficulty breathing.   You have any allergic problems.  MAKE SURE YOU:   Understand these instructions.   Will watch your condition.   Will get help right away if you are not doing well or get worse.  Document Released: 03/21/2005 Document Revised: 12/01/2010 Document Reviewed: 02/18/2009 [facility] Patient Information 2012 [place], [place].

## 2011-03-15 DIAGNOSIS — K4020 Bilateral inguinal hernia, without obstruction or gangrene, not specified as recurrent: Secondary | ICD-10-CM

## 2011-03-15 HISTORY — PX: LAPAROSCOPIC INGUINAL HERNIA REPAIR: SUR788

## 2011-03-17 ENCOUNTER — Telehealth (INDEPENDENT_AMBULATORY_CARE_PROVIDER_SITE_OTHER): Payer: Self-pay | Admitting: General Surgery

## 2011-03-17 NOTE — Telephone Encounter (Signed)
Called in Vicodin pro call into [facility] [phone]

## 2011-04-08 ENCOUNTER — Encounter (INDEPENDENT_AMBULATORY_CARE_PROVIDER_SITE_OTHER): Payer: Self-pay | Admitting: General Surgery

## 2011-04-08 ENCOUNTER — Ambulatory Visit (INDEPENDENT_AMBULATORY_CARE_PROVIDER_SITE_OTHER): Payer: BC Managed Care – PPO | Admitting: General Surgery

## 2011-04-08 VITALS — BP 130/88 | HR 68 | Temp 97.2°F | Resp 16 | Ht 69.5 in | Wt 128.0 lb

## 2011-04-08 DIAGNOSIS — Z09 Encounter for follow-up examination after completed treatment for conditions other than malignant neoplasm: Secondary | ICD-10-CM

## 2011-04-08 NOTE — Patient Instructions (Signed)
Continue to avoid strenous activity for another 2 weeks then you may gradually resume full activities

## 2011-04-08 NOTE — Progress Notes (Signed)
Chief complaint: Postop  Procedure: Status post laparoscopic bilateral indirect inguinal hernia repair with mesh, laparoscopic lysis of adhesions for 30 minutes on March 15, 2011  History of Present Ilness: 66 year old Caucasian male comes in for his first postoperative appointment. He states that overall he is doing fairly well. He states that he had a fair amount of soreness after surgery. However he only took for pain pills for one and a half days. He denies any fevers, chills, nausea, vomiting, diarrhea, or constipation. He states that his urinating the best he has in 5 years. He states that he has some occasional discomfort in his right lower abdominal wall. He describes it as a cramping sensation. He also complains of some hyper sensitivity to touch in his lower midline. However both these items are getting better.  Physical Exam: BP 130/88  Pulse 68  Temp(Src) 97.2 F (36.2 C) (Temporal)  Resp 16  Ht 5' 9.5" (1.765 m)  Wt 128 lb (58.06 kg)  BMI 18.63 kg/m2  Gen: alert, NAD, non-toxic appearing Pulm: Lungs clear to auscultation, symmetric chest rise CV: regular rate and rhythm Abd: soft, nontender, nondistended. Well-healed trocar sites. No cellulitis. No incisional hernia. A little abdominal protuberance in lower abdomen GU: both testicles descended, no scrotal masses, no sign of hernia recurrence Ext: no edema   Assessment and Plan: Status post laparoscopic bilateral indirect inguinal hernia repair with mesh, laparoscopic lysis of adhesions.  I think overall he is doing fairly well. I encouraged him to avoid strenuous activity for another 2 weeks to give a full 6 weeks. I explained to him that intermittent cramping and sensitivity to touch should improve over time. I'll see him in 2 months.  Mary Sella. Andrey Campanile, MD, FACS General, Bariatric, & Minimally Invasive Surgery [facility], [place]

## 2011-05-27 ENCOUNTER — Ambulatory Visit (INDEPENDENT_AMBULATORY_CARE_PROVIDER_SITE_OTHER): Payer: BC Managed Care – PPO | Admitting: General Surgery

## 2011-05-27 ENCOUNTER — Encounter (INDEPENDENT_AMBULATORY_CARE_PROVIDER_SITE_OTHER): Payer: Self-pay | Admitting: General Surgery

## 2011-05-27 VITALS — BP 130/84 | HR 80 | Resp 16 | Ht 69.75 in | Wt 133.0 lb

## 2011-05-27 DIAGNOSIS — Z09 Encounter for follow-up examination after completed treatment for conditions other than malignant neoplasm: Secondary | ICD-10-CM

## 2011-05-27 NOTE — Patient Instructions (Signed)
Can resume full activities 

## 2011-05-27 NOTE — Progress Notes (Signed)
Chief complaint: Followup Procedure: Status post laparoscopic lysis of adhesions, laparoscopic bilateral inguinal hernia repair March 15, 2011  History of Present Ilness: 66 year old Caucasian male comes in for his second postoperative appointment. I last saw him on April 08, 2011. He states that he has been doing well. He denies any inguinal discomfort. He denies any inguinal numbness or tingling. He denies any abdominal wall bulging. He denies any nausea, vomiting, diarrhea, or constipation. He denies any trouble urinating. He states that the swelling he had in his abdominal wall has resolved.  Physical Exam: BP 130/84  Pulse 80  Resp 16  Ht 5' 9.75" (1.772 m)  Wt 133 lb (60.328 kg)  BMI 19.22 kg/m2  Gen: alert, NAD, non-toxic appearing Abd: soft, nontender, nondistended. Well-healed trocar sites. No cellulitis. No incisional hernia GU: both testicles descended, no scrotal masses, no sign of hernia recurrence Ext: no edema  Assessment and Plan: Status post laparoscopic lysis of adhesions, laparoscopic bilateral indirect inguinal hernia repair with mesh March 15, 2011.  He appears to be doing quite well. I have released him to full activities.  Followup p.r.n.  Mary Sella. Andrey Campanile, MD, FACS General, Bariatric, & Minimally Invasive Surgery [facility], [place]

## 2012-01-25 ENCOUNTER — Encounter (INDEPENDENT_AMBULATORY_CARE_PROVIDER_SITE_OTHER): Payer: BC Managed Care – PPO | Admitting: Ophthalmology

## 2012-01-25 DIAGNOSIS — H43819 Vitreous degeneration, unspecified eye: Secondary | ICD-10-CM

## 2012-01-25 DIAGNOSIS — H2510 Age-related nuclear cataract, unspecified eye: Secondary | ICD-10-CM

## 2012-01-25 DIAGNOSIS — H35379 Puckering of macula, unspecified eye: Secondary | ICD-10-CM

## 2012-01-25 DIAGNOSIS — H33009 Unspecified retinal detachment with retinal break, unspecified eye: Secondary | ICD-10-CM

## 2012-01-25 DIAGNOSIS — H3530 Unspecified macular degeneration: Secondary | ICD-10-CM

## 2012-06-06 ENCOUNTER — Encounter (INDEPENDENT_AMBULATORY_CARE_PROVIDER_SITE_OTHER): Payer: Self-pay | Admitting: General Surgery

## 2012-06-06 ENCOUNTER — Ambulatory Visit (INDEPENDENT_AMBULATORY_CARE_PROVIDER_SITE_OTHER): Payer: BC Managed Care – PPO | Admitting: General Surgery

## 2012-06-06 VITALS — BP 130/80 | HR 76 | Temp 97.3°F | Resp 14 | Ht 69.0 in | Wt 132.0 lb

## 2012-06-06 DIAGNOSIS — S39011A Strain of muscle, fascia and tendon of abdomen, initial encounter: Secondary | ICD-10-CM

## 2012-06-06 DIAGNOSIS — IMO0002 Reserved for concepts with insufficient information to code with codable children: Secondary | ICD-10-CM

## 2012-06-06 NOTE — Progress Notes (Signed)
Subjective:     Patient ID: Wesley Fowler, male   DOB: 1945/10/04, 67 y.o.   MRN: [id number]  HPI 67 year old Caucasian male status post laparoscopic bilateral repair of indirect inguinal hernias as well as laparoscopic lysis of adhesions in December 2012 comes in because of new discomfort around his umbilicus. He states that about a week and a half ago he started to get some mild discomfort around his umbilicus. He rated it as a 3/10. It would mainly be sore when he would lift objects or do exertional activity. It did not bother him at rest. He denies any fever, chills, nausea, vomiting, diarrhea or constipation. He denies noticing a bulge in the area. He states that the discomfort has gotten better since he initially noticed it.  PMHx, PSHx, SOCHx, FAMHx, ALL reviewed   Review of Systems 10 point review systems is performed all systems are negative except as mentioned in history of present illness    Objective:   Physical Exam BP 130/80  Pulse 76  Temp(Src) 97.3 F (36.3 C) (Temporal)  Resp 14  Ht 5\' 9"  (1.753 m)  Wt 132 lb (59.875 kg)  BMI 19.48 kg/m2  Gen: alert, NAD, non-toxic appearing, thin Pulm: Lungs clear to auscultation, symmetric chest rise CV: regular rate and rhythm Abd: soft, nontender, nondistended. Well-healed trocar sites. No cellulitis. No incisional hernia. No fascial defect at umbilicus GU: both testicles descended, no scrotal masses, no sign of hernia recurrence Ext: no edema     Assessment:     Probable abdominal wall strain     Plan:     There is no evidence of umbilical hernia on exam either standing or supine. My guess is this is most consistent with abdominal wall strain. He was given Agricultural engineer. I advised him to try to minimize exertional or heavy lifting for another 1-2 weeks. I encouraged him to take NSAIDs or Tylenol as needed for the discomfort. He was also advised he can place ice packs to the area. He was instructed to call the office  should it worsen or not improve. Followup as needed  Mary Sella. Andrey Campanile, MD, FACS General, Bariatric, & Minimally Invasive Surgery [facility], [place]

## 2012-06-06 NOTE — Patient Instructions (Signed)
Muscle Strain °Muscle strain occurs when a muscle is stretched beyond its normal length. A small number of muscle fibers generally are torn. This is especially common in athletes. This happens when a sudden, violent force placed on a muscle stretches it too far. Usually, recovery from muscle strain takes 1 to 2 weeks. Complete healing will take 5 to 6 weeks.  °HOME CARE INSTRUCTIONS  °· While awake, apply ice to the sore muscle for the first 2 days after the injury. °· Put ice in a plastic bag. °· Place a towel between your skin and the bag. °· Leave the ice on for 15 to 20 minutes each hour. °· Do not use the strained muscle for several days, until you no longer have pain. °· You may wrap the injured area with an elastic bandage for comfort. Be careful not to wrap it too tightly. This may interfere with blood circulation or increase swelling. °· Only take over-the-counter or prescription medicines for pain, discomfort, or fever as directed by your caregiver. °SEEK MEDICAL CARE IF:  °You have increasing pain or swelling in the injured area. °MAKE SURE YOU:  °· Understand these instructions. °· Will watch your condition. °· Will get help right away if you are not doing well or get worse. °Document Released: 03/21/2005 Document Revised: 06/13/2011 Document Reviewed: 04/02/2011 °[employer]® Patient Information ©2013 [employer]. ° °

## 2012-07-19 ENCOUNTER — Telehealth: Payer: Self-pay | Admitting: Family Medicine

## 2012-07-19 MED ORDER — DOXYCYCLINE HYCLATE 100 MG PO TABS: 100.0000 mg | ORAL_TABLET | Freq: Two times a day (BID) | ORAL | Status: DC

## 2012-07-19 NOTE — Telephone Encounter (Signed)
Doxycycline 10 bid with food for 2 weeks

## 2012-07-19 NOTE — Telephone Encounter (Signed)
Removed the tick on Monday and now has a rash. We have no appts available

## 2012-07-19 NOTE — Telephone Encounter (Signed)
Has a blister where the tick was removed.  Has been applying Neosporin.  No bullseye rash or other symptoms.  Described the tick as having a white spot on its back which sounds like a lonestar tick.  Patient took a 2 week course of Doxycycline a few years ago for a tick bite and experienced nausea and abd discomfort with it.  [facility]    Suggested he switch to polysporin because it's generally better tolerated. Explained that Doxycyline is the preferred treatment for tick bites.  Informed him that I would pass on this information to Dr. Christell Constant for review and we will let him know what he should do.

## 2012-07-19 NOTE — Telephone Encounter (Signed)
Pres[facility] 100 mg twice daily for 2 weeks

## 2012-11-07 ENCOUNTER — Encounter (HOSPITAL_COMMUNITY): Payer: Self-pay | Admitting: Emergency Medicine

## 2012-11-07 DIAGNOSIS — I10 Essential (primary) hypertension: Secondary | ICD-10-CM | POA: Insufficient documentation

## 2012-11-07 DIAGNOSIS — E871 Hypo-osmolality and hyponatremia: Secondary | ICD-10-CM | POA: Insufficient documentation

## 2012-11-07 DIAGNOSIS — R279 Unspecified lack of coordination: Secondary | ICD-10-CM | POA: Insufficient documentation

## 2012-11-07 DIAGNOSIS — H81319 Aural vertigo, unspecified ear: Principal | ICD-10-CM | POA: Insufficient documentation

## 2012-11-07 DIAGNOSIS — F411 Generalized anxiety disorder: Secondary | ICD-10-CM | POA: Insufficient documentation

## 2012-11-07 LAB — COMPREHENSIVE METABOLIC PANEL
Albumin: 4 g/dL (ref 3.5–5.2)
Alkaline Phosphatase: 94 U/L (ref 39–117)
BUN: 6 mg/dL (ref 6–23)
Calcium: 9.2 mg/dL (ref 8.4–10.5)
Creatinine, Ser: 0.76 mg/dL (ref 0.50–1.35)
GFR calc Af Amer: >90 mL/min (ref >90)
Glucose, Bld: 104 mg/dL — ABNORMAL HIGH (ref 70–99)
Potassium: 3.5 mEq/L (ref 3.5–5.1)
Total Protein: 6.6 g/dL (ref 6.0–8.3)

## 2012-11-07 LAB — CBC WITH DIFFERENTIAL/PLATELET
Basophils Absolute: 0 10*3/uL (ref 0.0–0.1)
Basophils Relative: 1 % (ref 0–1)
Eosinophils Absolute: 0 10*3/uL (ref 0.0–0.7)
Eosinophils Relative: 1 % (ref 0–5)
Hemoglobin: 12.3 g/dL — ABNORMAL LOW (ref 13.0–17.0)
Lymphocytes Relative: 24 % (ref 12–46)
Lymphs Abs: 1 10*3/uL (ref 0.7–4.0)
Monocytes Absolute: 0.4 10*3/uL (ref 0.1–1.0)
Neutro Abs: 2.8 10*3/uL (ref 1.7–7.7)
Neutrophils Relative %: 65 % (ref 43–77)
Platelets: 219 10*3/uL (ref 150–400)
RBC: 3.86 MIL/uL — ABNORMAL LOW (ref 4.22–5.81)
WBC: 4.3 10*3/uL (ref 4.0–10.5)

## 2012-11-07 NOTE — ED Notes (Signed)
PT. REPORTS BRIEF EPISODE OF DIZZINESS , NAUSEA AND UNSTEADY GAIT ONSET THIS AFTERNOON , ALERT AND ORIENTED AT ARRIVAL , SPEECH CLEAR / NO FACIAL ASYMMETRY , AMBULATORY , EQUAL GRIPS , NO ARM DRIFT.

## 2012-11-08 ENCOUNTER — Observation Stay (HOSPITAL_COMMUNITY): Payer: BC Managed Care – PPO

## 2012-11-08 ENCOUNTER — Emergency Department (HOSPITAL_COMMUNITY): Payer: BC Managed Care – PPO

## 2012-11-08 ENCOUNTER — Observation Stay (HOSPITAL_COMMUNITY)
Admission: EM | Admit: 2012-11-08 | Discharge: 2012-11-08 | Disposition: A | Discharge status: Home / Self Care | Payer: BC Managed Care – PPO | Attending: Internal Medicine | Admitting: Internal Medicine

## 2012-11-08 ENCOUNTER — Encounter (HOSPITAL_COMMUNITY): Payer: Self-pay | Admitting: Radiology

## 2012-11-08 DIAGNOSIS — G450 Vertebro-basilar artery syndrome: Secondary | ICD-10-CM

## 2012-11-08 DIAGNOSIS — G459 Transient cerebral ischemic attack, unspecified: Secondary | ICD-10-CM

## 2012-11-08 DIAGNOSIS — R42 Dizziness and giddiness: Secondary | ICD-10-CM

## 2012-11-08 DIAGNOSIS — R270 Ataxia, unspecified: Secondary | ICD-10-CM

## 2012-11-08 DIAGNOSIS — R279 Unspecified lack of coordination: Secondary | ICD-10-CM

## 2012-11-08 DIAGNOSIS — E871 Hypo-osmolality and hyponatremia: Secondary | ICD-10-CM

## 2012-11-08 DIAGNOSIS — F419 Anxiety disorder, unspecified: Secondary | ICD-10-CM

## 2012-11-08 DIAGNOSIS — I10 Essential (primary) hypertension: Secondary | ICD-10-CM

## 2012-11-08 HISTORY — DX: Anxiety disorder, unspecified: F41.9

## 2012-11-08 LAB — PROTIME-INR
INR: 1.02 (ref 0.00–1.49)
Prothrombin Time: 13.2 seconds (ref 11.6–15.2)

## 2012-11-08 LAB — URINALYSIS, ROUTINE W REFLEX MICROSCOPIC
Glucose, UA: NEGATIVE mg/dL
Ketones, ur: 15 mg/dL — AB
Leukocytes, UA: NEGATIVE
Nitrite: NEGATIVE
pH: 6.5 (ref 5.0–8.0)

## 2012-11-08 LAB — RAPID URINE DRUG SCREEN, HOSP PERFORMED
Barbiturates: NOT DETECTED
Benzodiazepines: NOT DETECTED

## 2012-11-08 LAB — GLUCOSE, CAPILLARY: Glucose-Capillary: 81 mg/dL (ref 70–99)

## 2012-11-08 LAB — APTT: aPTT: 33 seconds (ref 24–37)

## 2012-11-08 LAB — TROPONIN I: Troponin I: <0.3 ng/mL (ref <0.30)

## 2012-11-08 LAB — ETHANOL: Alcohol, Ethyl (B): <11 mg/dL (ref 0–11)

## 2012-11-08 LAB — POCT I-STAT TROPONIN I: Troponin i, poc: 0 ng/mL (ref 0.00–0.08)

## 2012-11-08 MED ORDER — OMEGA-3-ACID ETHYL ESTERS 1 G PO CAPS
1.0000 g | ORAL_CAPSULE | Freq: Every day | ORAL | Status: DC
  Administered 2012-11-08: 1 g via ORAL
  Filled 2012-11-08: qty 1

## 2012-11-08 MED ORDER — ASPIRIN EC 325 MG PO TBEC
325.0000 mg | DELAYED_RELEASE_TABLET | Freq: Every day | ORAL | Status: DC
  Administered 2012-11-08: 325 mg via ORAL
  Filled 2012-11-08: qty 1

## 2012-11-08 MED ORDER — ASPIRIN 325 MG PO TABS
325.0000 mg | ORAL_TABLET | Freq: Once | ORAL | Status: AC
  Administered 2012-11-08: 325 mg via ORAL
  Filled 2012-11-08: qty 1

## 2012-11-08 MED ORDER — MECLIZINE HCL 25 MG PO TABS
25.0000 mg | ORAL_TABLET | Freq: Two times a day (BID) | ORAL | Status: DC | PRN
  Filled 2012-11-08: qty 1

## 2012-11-08 MED ORDER — BUPROPION HCL ER (XL) 150 MG PO TB24
150.0000 mg | ORAL_TABLET | Freq: Every day | ORAL | Status: DC
  Administered 2012-11-08: 150 mg via ORAL
  Filled 2012-11-08: qty 1

## 2012-11-08 MED ORDER — ASPIRIN 325 MG PO TABS: 325.0000 mg | ORAL_TABLET | Freq: Every day | ORAL | Status: DC

## 2012-11-08 MED ORDER — OMEGA-3 FATTY ACIDS 1000 MG PO CAPS: 1.0000 g | ORAL_CAPSULE | Freq: Every day | ORAL | Status: DC

## 2012-11-08 MED ORDER — VITAMIN B-12 1000 MCG PO TABS
1000.0000 ug | ORAL_TABLET | Freq: Every day | ORAL | Status: DC
  Administered 2012-11-08: 1000 ug via ORAL
  Filled 2012-11-08: qty 1

## 2012-11-08 MED ORDER — MECLIZINE HCL 12.5 MG PO TABS
12.5000 mg | ORAL_TABLET | Freq: Two times a day (BID) | ORAL | Status: DC
  Filled 2012-11-08 (×2): qty 1

## 2012-11-08 MED ORDER — ACETAMINOPHEN 500 MG PO TABS
500.0000 mg | ORAL_TABLET | Freq: Every day | ORAL | Status: DC | PRN
  Filled 2012-11-08: qty 1

## 2012-11-08 MED ORDER — VITAMIN D 1000 UNITS PO TABS
2000.0000 [IU] | ORAL_TABLET | Freq: Every day | ORAL | Status: DC
  Administered 2012-11-08: 2000 [IU] via ORAL
  Filled 2012-11-08: qty 2

## 2012-11-08 MED ORDER — MECLIZINE HCL 25 MG PO TABS: 25.0000 mg | ORAL_TABLET | Freq: Three times a day (TID) | ORAL | Status: DC | PRN

## 2012-11-08 MED ORDER — SODIUM CHLORIDE 0.9 % IV SOLN
INTRAVENOUS | Status: DC
  Administered 2012-11-08: 100 mL/h via INTRAVENOUS

## 2012-11-08 MED ORDER — DIAZEPAM 2 MG PO TABS: 2.0000 mg | ORAL_TABLET | Freq: Four times a day (QID) | ORAL | Status: DC | PRN

## 2012-11-08 NOTE — Progress Notes (Signed)
VASCULAR LAB PRELIMINARY  PRELIMINARY  PRELIMINARY  PRELIMINARY  Carotid duplex completed.    Preliminary report:  Bilateral:  1% to 39% ICA stenosis.  Vertebral artery flow is antegrade.     Wesley Fowler, RVS 11/08/2012, 11:04 AM

## 2012-11-08 NOTE — Evaluation (Signed)
Physical Therapy Evaluation Patient Details Name: Wesley Fowler MRN: [id number] DOB: Nov 03, 1945 Today's Date: 11/08/2012 Time: 4098-1191 PT Time Calculation (min): 29 min  PT Assessment / Plan / Recommendation History of Present Illness  Patient is a 67 y/o male admitted with dizziness/vertigo.  MRI negative for acute issues.  Reports previous h/o muscle pull in neck years ago with residual motion limitations.  Clinical Impression  Patient presents independent with mobility.  Presents with inconsistent signs of possible vestibular hypofunction but difficult to tell due to pt with scheduled antivert given during hospitalization.  Feel may need outpatient vestibular PT if has symptoms after d/c.  Pt aware to contact primary MD if needed after d/c.  No current recommendations.  Feel pt safe to d/c home.    PT Assessment  Patent does not need any further PT services    Follow Up Recommendations  No PT follow up          Equipment Recommendations  None recommended by PT          Precautions / Restrictions Precautions Precautions: None   Pertinent Vitals/Pain Denies pain      Mobility  Bed Mobility Bed Mobility: Supine to Sit Supine to Sit: 7: Independent Transfers Transfers: Stand to Sit;Sit to Stand Sit to Stand: 7: Independent;From bed Stand to Sit: 7: Independent;To bed Ambulation/Gait Ambulation/Gait Assistance: 7: Independent Ambulation Distance (Feet): 200 Feet Assistive device: None Ambulation/Gait Assistance Details: slight pain initially right hip with pt reports bursitis s/p recent injection Gait Pattern: Trunk flexed;Step-through pattern;Lateral trunk lean to right Stairs: Yes Stairs Assistance: 7: Independent Stair Management Technique: Alternating pattern;No rails Number of Stairs: 5    Vestibular Assessment  Oculomotor: saccades and smooth pursuits WNL; eye alignment occasionally off with 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  Pt reports  right lens in glasses different and has occasional dizziness initially donning glasses in the am.  Vestibular: performes horizontal and vertical VOR (slight difficulty with maintaining target at times with moving head up with vertical head movements, no c/o dizziness with vertical, 2-3/10 with horizontal, VOR cancellation WNL, head thrust test with refixation saccade noted with leftward head movement.  Positional: supine head roll negative for nystagmus or c/o symptoms.  deferred hall pike due to no compalints with supine<>sit.    Subjective:     Patient describes symptoms with onset yesterday initially feeling little off balance with turns, then feeling in general off balance and had some nausea when       lying down. No changes in hearing or vision, but feels mild tenderness outside of right ear to touch and stated got some water in it.   PT Goals(Current goals can be found in the care plan section) Acute Rehab PT Goals PT Goal Formulation: No goals set, d/c therapy  Visit Information  Last PT Received On: 11/08/12 Assistance Needed: +1 History of Present Illness: Patient is a 67 y/o male admitted with dizziness/vertigo.  MRI negative for acute issues.  Reports previous h/o muscle pull in neck years ago with residual motion limitations.       Prior Functioning  Home Living Family/patient expects to be discharged to:: Private residence Living Arrangements: Spouse/significant other Available Help at Discharge: Family;Available 24 hours/day Type of Home: House Home Access: Stairs to enter [employer] of Steps: 2-3 Entrance Stairs-Rails: None Home Layout: One level Home Equipment: Crutches Prior Function Level of Independence: Independent Communication Communication: No difficulties Dominant Hand: Right    Cognition  Cognition Arousal/Alertness: Awake/alert  Behavior During Therapy: WFL for tasks assessed/performed Overall Cognitive Status: Within Functional Limits for  tasks assessed    Extremity/Trunk Assessment Lower Extremity Assessment Lower Extremity Assessment: Overall WFL for tasks assessed   Balance Standardized Balance Assessment Standardized Balance Assessment: Dynamic Gait Index Dynamic Gait Index Level Surface: Normal Change in Gait Speed: Normal Gait with Horizontal Head Turns: Normal Gait with Vertical Head Turns: Normal Gait and Pivot Turn: Normal Step Over Obstacle: Normal Step Around Obstacles: Normal Steps: Normal Total Score: 24  End of Session PT - End of Session Activity Tolerance: Patient tolerated treatment well Patient left: in bed;with family/visitor present;with call bell/phone within reach Nurse Communication: Other (comment);Mobility status (okay for d/c no PT needs)  GP Functional Assessment Tool Used: Clinical Judgement Functional Limitation: Self care Self Care Current Status ([id number]): At least 1 percent but less than 20 percent impaired, limited or restricted Self Care Goal Status (W2956): 0 percent impaired, limited or restricted Self Care Discharge Status [phone]): 0 percent impaired, limited or restricted   [facility] 11/08/2012, 3:23 PM Sheran Lawless, PT [phone] 11/08/2012

## 2012-11-08 NOTE — Discharge Summary (Addendum)
Physician Discharge Summary  Wesley Fowler ZOX:[id number] DOB: Jan 31, 1946 DOA: 11/08/2012  PCP: Rudi Heap, MD  Admit date: 11/08/2012 Discharge date: 11/08/2012  Time spent: <30 minutes  Recommendations for Outpatient Follow-up:      Follow-up Information   Follow up with Rudi Heap, MD. (in 1week, have bmet/sodium rechecked on follow up.)    Contact information:   [address] [place] [address] [phone]     PCP to followup on: -Bmet at office for Na   Discharge Diagnoses:  Principal Problem:   Vertigo Active Problems:   ?HTN (hypertension)   Anxiety   Discharge Condition: Improved/stable  Diet recommendation: Heart healthy  Filed Weights   11/08/12 0429  Weight: 53.933 kg (118 lb 14.4 oz)    History of present illness:  Wesley Fowler is an 67 y.o. male with benign PMH including HTN, retinal detachment OD, in his usual state of health until tonight when he was out at [facility], suddenly developed ataxia and severe vertigo with mild nausea. He didn't have any vertigo with head movement, slurred speech, facial droop or focal weakness, but he noticed ataxia. He actually had trouble walking a week prior to his vertigo. After about 45 minutes, his vertigo resolved spontaneously and completely. He denied any tinnitus or hearing loss. Evaluation in the ER included a negative head CT. His serology was rather unremarkable except for Na of 128, and Hb of 12.3 grams per DL. Hospitalist was asked to admit him for central vertigo.   Hospital Course:  Present on Admission:  . Vertigo, peripheral -As discussed above, upon admission there was concern for  Central vertigo and so MRI was obtained which came back negative for acute infarct an MRA with no evidence of significant proximal stenosis aneurysm or branch. -PT was consulted and evaluated patient with no followup recommendations  -Patient has not had any further vertigo,  He wast started on Antivert when  necessary -he is to follow up outpatient with his PCP  . ?HTN (hypertension) -On no medications, he is remaining controlled his to followup with his PCP  . Anxiety -He is to continue his outpatient medications upon  Hyponatremia -he was hydrated with IV fluids -To followup with his PCP for recheck Procedures:  2-D echo  Study Conclusions  - Left ventricle: The cavity size was normal. Systolic function was normal. The estimated ejection fraction was in the range of 50% to 55%. Wall motion was normal; there were no regional wall motion abnormalities. - Aortic valve: Mild regurgitation. - Aortic root: The aortic root was mildly dilated. Impressions:  - No cardiac source of embolism was identified, but cannot be ruled out on the basis of this examination. Carotid duplex completed.  Preliminary report: Bilateral: 1% to 39% ICA stenosis. Vertebral artery flow is antegrade.    Consultations:  none  Discharge Exam: Filed Vitals:   11/08/12 1359  BP: 129/76  Pulse: 70  Temp: 98.4 F (36.9 C)  Resp: 20    Exam:  General: alert & oriented x 3 In NAD Cardiovascular: RRR, nl S1 s2 Respiratory: CTAB Abdomen: soft +BS NT/ND, no masses palpable Extremities: No cyanosis and no edema     Discharge Instructions  Discharge Orders   Future Appointments Provider Department Dept Phone   11/29/2012 2:00 PM Ernestina Penna, MD [facility] [phone]   04/08/2013 7:45 AM Sherrie George, MD [facility] [phone]   Future Orders Complete By Expires  Diet Heart  As directed     Increase activity slowly  As directed         Medication List    STOP taking these medications       ibuprofen 200 MG tablet  Commonly known as:  ADVIL,MOTRIN     Vitamin D3 2000 UNITS Tabs      TAKE these medications       acetaminophen 500 MG tablet  Commonly known as:  TYLENOL  Take 500 mg by mouth daily as needed for pain.     aspirin  EC 325 MG tablet  Take 325 mg by mouth daily.     buPROPion 150 MG 24 hr tablet  Commonly known as:  WELLBUTRIN XL  Take 150 mg by mouth daily.     diazepam 2 MG tablet  Commonly known as:  VALIUM  Take 2 mg by mouth every 6 (six) hours as needed for anxiety.     fish oil-omega-3 fatty acids 1000 MG capsule  Take 1 g by mouth daily.     meclizine 25 MG tablet  Commonly known as:  ANTIVERT  Take 1 tablet (25 mg total) by mouth 3 (three) times daily as needed for dizziness (vertigo).     MULTIVITAMIN PO  Take 1 tablet by mouth daily.     OSTEO BI-FLEX JOINT SHIELD PO  Take 1 tablet by mouth daily.     vitamin B-12 1000 MCG tablet  Commonly known as:  CYANOCOBALAMIN  Take 1,000 mcg by mouth daily.       Allergies  Allergen Reactions  . Percocet (Oxycodone-Acetaminophen) Nausea And Vomiting  . Sulfa Antibiotics Nausea Only       Follow-up Information   Follow up with Rudi Heap, MD. (in 1week, have bmet/sodium rechecked on follow up.)    Contact information:   [address] [place] [address] [phone]        The results of significant diagnostics from this hospitalization (including imaging, microbiology, ancillary and laboratory) are listed below for reference.    Significant Diagnostic Studies: Ct Head Wo Contrast  11/08/2012   *RADIOLOGY REPORT*  Clinical Data: Dizziness, nausea and unsteady gait.  CT HEAD WITHOUT CONTRAST  Technique:  Contiguous axial images were obtained from the base of the skull through the vertex without contrast.  Comparison: Head CT scan 10/12/2007.  Findings: There is no evidence of acute intracranial abnormality including infarction, hemorrhage, mass lesion, mass effect, midline shift or abnormal extra-axial fluid collection.  There is no hydrocephalus or pneumocephalus.  The calvarium is intact.  Imaged paranasal sinuses and mastoid air cells are clear.  IMPRESSION: Negative exam.   Original Report Authenticated By: Holley Dexter, M.D.   Mr [facility] Wo Contrast  11/08/2012   *RADIOLOGY REPORT*  Clinical Data:  Episode involving onset of ataxia, severe vertigo, and mild nausea.  MRI HEAD WITHOUT CONTRAST MRA HEAD WITHOUT CONTRAST  Technique:  Multiplanar, multiecho pulse sequences of the brain and surrounding structures were obtained without intravenous contrast. Angiographic images of the head were obtained using MRA technique without contrast.  Comparison:   None  MRI HEAD  Findings:  Mild generalized atrophy is advanced for age. Relatively minimal white matter disease is present.  No acute infarct, hemorrhage, mass lesion is present.  The ventricles are proportionate to the degree of atrophy.  No significant extra-axial fluid collection is present.  Flow is present in the major intracranial arteries.  The patient is status post bilateral lens extractions.  Scleral banding is noted on the right.  The paranasal sinuses and mastoid air cells are clear.  IMPRESSION:  1.  Mild generalized atrophy is advanced for age. 2.  No acute intracranial abnormality. 3.  Bilateral lens extractions and right scleral banding.  MRA HEAD  Findings: The internal carotid arteries are within normal limits from high cervical segments through the ICA termini bilaterally. The A1 and M1 segments are normal.  The anterior communicating artery is patent.  The MCA bifurcations are within normal limits. The ACA and MCA branch vessels are unremarkable.  The right vertebral artery is the dominant vessel.  The left vertebral artery essentially terminates at the PICA, extending only a very small branch to the vertebral basilar junction.  The left vertebral artery originates from the basilar tip with a posterior communicating artery.  The right vertebral artery is predominately fed from the right posterior communicating artery with a smaller right P1 segment.  There is some attenuation of distal PCA branch vessels, worse on the left.  IMPRESSION: Normal variant  MRA circle of Willis without evidence for significant proximal stenosis, aneurysm, or branch vessel occlusion.   Original Report Authenticated By: Marin Roberts, M.D.   Mr Brain Wo Contrast  11/08/2012   *RADIOLOGY REPORT*  Clinical Data:  Episode involving onset of ataxia, severe vertigo, and mild nausea.  MRI HEAD WITHOUT CONTRAST MRA HEAD WITHOUT CONTRAST  Technique:  Multiplanar, multiecho pulse sequences of the brain and surrounding structures were obtained without intravenous contrast. Angiographic images of the head were obtained using MRA technique without contrast.  Comparison:   None  MRI HEAD  Findings:  Mild generalized atrophy is advanced for age. Relatively minimal white matter disease is present.  No acute infarct, hemorrhage, mass lesion is present.  The ventricles are proportionate to the degree of atrophy.  No significant extra-axial fluid collection is present.  Flow is present in the major intracranial arteries.  The patient is status post bilateral lens extractions.  Scleral banding is noted on the right.  The paranasal sinuses and mastoid air cells are clear.  IMPRESSION:  1.  Mild generalized atrophy is advanced for age. 2.  No acute intracranial abnormality. 3.  Bilateral lens extractions and right scleral banding.  MRA HEAD  Findings: The internal carotid arteries are within normal limits from high cervical segments through the ICA termini bilaterally. The A1 and M1 segments are normal.  The anterior communicating artery is patent.  The MCA bifurcations are within normal limits. The ACA and MCA branch vessels are unremarkable.  The right vertebral artery is the dominant vessel.  The left vertebral artery essentially terminates at the PICA, extending only a very small branch to the vertebral basilar junction.  The left vertebral artery originates from the basilar tip with a posterior communicating artery.  The right vertebral artery is predominately fed from the right posterior  communicating artery with a smaller right P1 segment.  There is some attenuation of distal PCA branch vessels, worse on the left.  IMPRESSION: Normal variant MRA circle of Willis without evidence for significant proximal stenosis, aneurysm, or branch vessel occlusion.   Original Report Authenticated By: Marin Roberts, M.D.    Microbiology: No results found for this or any previous visit (from the past 240 hour(s)).   Labs: Basic Metabolic Panel:  Recent Labs Lab 11/07/12 1954  NA 128*  K 3.5  CL 94*  CO2 25  GLUCOSE 104*  BUN 6  CREATININE 0.76  CALCIUM 9.2  Liver Function Tests:  Recent Labs Lab 11/07/12 1954  AST 22  ALT 17  ALKPHOS 94  BILITOT 0.5  PROT 6.6  ALBUMIN 4.0   No results found for this basename: LIPASE, AMYLASE,  in the last 168 hours No results found for this basename: AMMONIA,  in the last 168 hours CBC:  Recent Labs Lab 11/07/12 1954  WBC 4.3  NEUTROABS 2.8  HGB 12.3*  HCT 33.9*  MCV 87.8  PLT 219   Cardiac Enzymes:  Recent Labs Lab 11/08/12 0151  TROPONINI <0.30   BNP: BNP (last 3 results) No results found for this basename: PROBNP,  in the last 8760 hours CBG:  Recent Labs Lab 11/08/12 0209  GLUCAP 81       Signed:  Atari Novick C  [facility] 11/08/2012, 3:42 PM

## 2012-11-08 NOTE — Progress Notes (Signed)
[facility] PROGRESS NOTE  Wesley Fowler FAO:[id number] DOB: March 11, 1946 DOA: 11/08/2012 PCP: Rudi Heap, MD I have seen and examined pt who is a 67yo with history of hypertension and retinal detachment admitted this am by Dr Conley Rolls with sudden onset vertigo ataxia and mild nausea. CT scan in the ED was negative, sodium low at 128. He denies any further vertigo/imbalance at this time. Will follow up on MRI and workup for possible vertebrobasilar infarct, continue current management plan as per Dr. Conley Rolls and further recommendations pending studies.     Kela Millin  [facility] Pager [phone]. If 7PM-7AM, please contact night-coverage at [email or web address], password [facility] 11/08/2012, 8:17 AM  LOS: 0 days

## 2012-11-08 NOTE — Progress Notes (Signed)
Nutrition Brief Note  RD spoke with patient' wife regarding patient's weight trends. Pt with usual weight of 130 lb per wife's report. She notes that he weighs himself daily and his weight and oral intake has been very stable. She believes that this weight is incorrect. Pt is off unit, RD unable to speak with patient at this time. Wife denies any questions or concerns regarding nutrition.  Wt Readings from Last 15 Encounters:  11/08/12 118 lb 14.4 oz (53.933 kg)  06/06/12 132 lb (59.875 kg)  05/27/11 133 lb (60.328 kg)  04/08/11 128 lb (58.06 kg)  03/04/11 129 lb 12.8 oz (58.877 kg)   Current diet order is Heart Healthy. Labs and medications reviewed.   No nutrition interventions warranted at this time. If nutrition issues arise, please consult RD.   Jarold Motto MS, RD, LDN Pager: [phone] After-hours pager: [phone]

## 2012-11-08 NOTE — H&P (Signed)
[facility] History and Physical  JOHNE BUCKLE NWG:[id number] DOB: March 01, 1946    PCP:   Rudi Heap, MD   Chief Complaint: vertigo for 45 minutes.  HPI: Wesley Fowler is an 67 y.o. male with benign PMH including HTN, retinal detachment OD, in his usual state of health until tonight when he was out at [facility], suddenly developed ataxia and severe vertigo with mild nausea.  He didn't have any vertigo with head movement, slurred speech, facial droop or focal weakness, but he noticed ataxia.  He actually had trouble walking a week prior to his vertigo.  After about 45 minutes, his vertigo resolved spontaneously and completely.  He denied any tinnitus or hearing loss.  Evaluation in the ER included a negative head CT. His serology was rather unremarkable except for Na of 128, and Hb of 12.3 grams per DL.  Hospitalist was asked to admit him for central vertigo.  Rewiew of Systems:  Constitutional: Negative for malaise, fever and chills. No significant weight loss or weight gain Eyes: Negative for eye pain, redness and discharge, diplopia, visual changes, or flashes of light. ENMT: Negative for ear pain, hoarseness, nasal congestion, sinus pressure and sore throat. No headaches; tinnitus, drooling, or problem swallowing. Cardiovascular: Negative for chest pain, palpitations, diaphoresis, dyspnea and peripheral edema. ; No orthopnea, PND Respiratory: Negative for cough, hemoptysis, wheezing and stridor. No pleuritic chestpain. Gastrointestinal: Negative for vomiting, diarrhea, constipation, abdominal pain, melena, blood in stool, hematemesis, jaundice and rectal bleeding.    Genitourinary: Negative for frequency, dysuria, incontinence,flank pain and hematuria; Musculoskeletal: Negative for back pain and neck pain. Negative for swelling and trauma.;  Skin: . Negative for pruritus, rash, abrasions, bruising and skin lesion.; ulcerations Neuro: Negative for headache, and neck stiffness.  Negative for weakness, altered level of consciousness , altered mental status, extremity weakness, burning feet, involuntary movement, seizure and syncope.  Psych: negative for anxiety, depression, insomnia, tearfulness, panic attacks, hallucinations, paranoia, suicidal or homicidal ideation    Past Medical History  Diagnosis Date  . History of high blood pressure   . Arthritis   . Anxiety     Past Surgical History  Procedure Laterality Date  . Retinal detachment surgery  2004    right  . Knee arthroscopy  2004    right  . Laparoscopic inguinal hernia repair  03/15/11    bilateral    Medications:  HOME MEDS: Prior to Admission medications   Medication Sig Start Date End Date Taking? Authorizing Provider  acetaminophen (TYLENOL) 500 MG tablet Take 500 mg by mouth daily as needed for pain.   Yes Historical Provider, MD  aspirin EC 325 MG tablet Take 325 mg by mouth daily.   Yes Historical Provider, MD  buPROPion (WELLBUTRIN XL) 150 MG 24 hr tablet Take 150 mg by mouth daily.   Yes Historical Provider, MD  Cholecalciferol (VITAMIN D3) 2000 UNITS TABS Take 2,000 Units by mouth daily.   Yes Historical Provider, MD  diazepam (VALIUM) 2 MG tablet Take 2 mg by mouth every 6 (six) hours as needed for anxiety.    Yes Historical Provider, MD  fish oil-omega-3 fatty acids 1000 MG capsule Take 1 g by mouth daily.    Yes Historical Provider, MD  ibuprofen (ADVIL,MOTRIN) 200 MG tablet Take 200 mg by mouth daily as needed for pain.   Yes Historical Provider, MD  Misc Natural Products (OSTEO BI-FLEX JOINT SHIELD PO) Take 1 tablet by mouth daily.    Yes Historical Provider, MD  Multiple  Vitamins-Minerals (MULTIVITAMIN PO) Take 1 tablet by mouth daily.   Yes Historical Provider, MD  vitamin B-12 (CYANOCOBALAMIN) 1000 MCG tablet Take 1,000 mcg by mouth daily.   Yes Historical Provider, MD     Allergies:  Allergies  Allergen Reactions  . Percocet (Oxycodone-Acetaminophen) Nausea And Vomiting  .  Sulfa Antibiotics Nausea Only    Social History:   reports that he has quit smoking. He does not have any smokeless tobacco history on file. He reports that he does not drink alcohol or use illicit drugs.  Family History: Family History  Problem Relation Age of Onset  . Lung cancer Maternal Grandfather      Physical Exam: Filed Vitals:   11/08/12 0220 11/08/12 0300 11/08/12 0330 11/08/12 0429  BP:  111/74 118/76 142/81  Pulse:  64 78 65  Temp: 98.5 F (36.9 C)   98.3 F (36.8 C)  TempSrc:    Oral  Resp:  11 12 18   Height:    5\' 9"  (1.753 m)  Weight:    53.933 kg (118 lb 14.4 oz)  SpO2:  100% 100% 99%   Blood pressure 142/81, pulse 65, temperature 98.3 F (36.8 C), temperature source Oral, resp. rate 18, height 5\' 9"  (1.753 m), weight 53.933 kg (118 lb 14.4 oz), SpO2 99.00%.  GEN:  Pleasant  patient lying in the stretcher in no acute distress; cooperative with exam. PSYCH:  alert and oriented x4; does not appear anxious or depressed; affect is appropriate. HEENT: Mucous membranes pink and anicteric; PERRLA; EOM intact; no cervical lymphadenopathy nor thyromegaly or carotid bruit; no JVD; There were no stridor. Neck is very supple. Breasts:: Not examined CHEST WALL: No tenderness CHEST: Normal respiration, clear to auscultation bilaterally.  HEART: Regular rate and rhythm.  There are no murmur, rub, or gallops.   BACK: No kyphosis or scoliosis; no CVA tenderness ABDOMEN: soft and non-tender; no masses, no organomegaly, normal abdominal bowel sounds; no pannus; no intertriginous candida. There is no rebound and no distention. Rectal Exam: Not done EXTREMITIES: No bone or joint deformity; age-appropriate arthropathy of the hands and knees; no edema; no ulcerations.  There is no calf tenderness. Genitalia: not examined PULSES: 2+ and symmetric SKIN: Normal hydration no rash or ulceration CNS: Cranial nerves 2-12 grossly intact no focal lateralizing neurologic deficit.  Speech  is fluent; uvula elevated with phonation, facial symmetry and tongue midline. DTR are normal bilaterally, cerebella exam is intact, barbinski is negative and strengths are equaled bilaterally.  No sensory loss.   Labs on Admission:  Basic Metabolic Panel:  Recent Labs Lab 11/07/12 1954  NA 128*  K 3.5  CL 94*  CO2 25  GLUCOSE 104*  BUN 6  CREATININE 0.76  CALCIUM 9.2   Liver Function Tests:  Recent Labs Lab 11/07/12 1954  AST 22  ALT 17  ALKPHOS 94  BILITOT 0.5  PROT 6.6  ALBUMIN 4.0   No results found for this basename: LIPASE, AMYLASE,  in the last 168 hours No results found for this basename: AMMONIA,  in the last 168 hours CBC:  Recent Labs Lab 11/07/12 1954  WBC 4.3  NEUTROABS 2.8  HGB 12.3*  HCT 33.9*  MCV 87.8  PLT 219   Cardiac Enzymes:  Recent Labs Lab 11/08/12 0151  TROPONINI <0.30    CBG:  Recent Labs Lab 11/08/12 0209  GLUCAP 81     Radiological Exams on Admission: Ct Head Wo Contrast  11/08/2012   *RADIOLOGY REPORT*  Clinical Data: Dizziness,  nausea and unsteady gait.  CT HEAD WITHOUT CONTRAST  Technique:  Contiguous axial images were obtained from the base of the skull through the vertex without contrast.  Comparison: Head CT scan 10/12/2007.  Findings: There is no evidence of acute intracranial abnormality including infarction, hemorrhage, mass lesion, mass effect, midline shift or abnormal extra-axial fluid collection.  There is no hydrocephalus or pneumocephalus.  The calvarium is intact.  Imaged paranasal sinuses and mastoid air cells are clear.  IMPRESSION: Negative exam.   Original Report Authenticated By: Holley Dexter, M.D.   Assessment/Plan Present on Admission:  . Vertigo . VBI (vertebrobasilar insufficiency) . HTN (hypertension) . Anxiety Nausea Hyponatremia  PLAN:  I am concerned as well that this may be central vertigo.   He may have VBI.  Will admit him and do full work up for TIA of the posterior circulation.  His  neuro exam is very benign at this time.  He also has hyponatremia, unclear exact etiology, but may be because of his nausea ( a potent release of ADH).  He denied alcohol consumption.  Will give IV NS and check urine Na.  I have continued him on ASA.  He is stable, full code, and will be admitted to telemetry under [facility] service.  Thank you for letting me partake in the care of this nice patient.  Other plans as per orders.  Code Status: FULL Unk Lightning, MD. [facility] Pager [phone] 7pm to 7am.  11/08/2012, 6:05 AM

## 2012-11-08 NOTE — ED Provider Notes (Addendum)
CSN: [id number]     Arrival date & time 11/07/12  1940 History     First MD Initiated Contact with Patient 11/08/12 0139     Chief Complaint  Patient presents with  . Dizziness  . Nausea   (Consider location/radiation/quality/duration/timing/severity/associated sxs/prior Treatment) HPI This 67 year old male had about a one-hour episode is now completely resolved started at about noon of gait ataxia with some slight nausea but no lightheadedness no other symptoms, he had no headache no change in speech vision swallowing or understanding no focal or lateralizing weakness numbness or incoordination but had difficulty walking and felt unsteady with no chest pain palpitations lightheadedness abdominal pain bloody stools other concerns or treatment prior to arrival he now feels normal. Past Medical History  Diagnosis Date  . History of high blood pressure   . Arthritis   . Anxiety    Past Surgical History  Procedure Laterality Date  . Retinal detachment surgery  2004    right  . Knee arthroscopy  2004    right  . Laparoscopic inguinal hernia repair  03/15/11    bilateral   Family History  Problem Relation Age of Onset  . Lung cancer Maternal Grandfather    History  Substance Use Topics  . Smoking status: Former Games developer  . Smokeless tobacco: Not on file     Comment: quit 1982  . Alcohol Use: No    Review of Systems 10 Systems reviewed and are negative for acute change except as noted in the HPI. Allergies  Percocet and Sulfa antibiotics  Home Medications   Current Outpatient Rx  Name  Route  Sig  Dispense  Refill  . acetaminophen (TYLENOL) 500 MG tablet   Oral   Take 500 mg by mouth daily as needed for pain.         Marland Kitchen aspirin EC 325 MG tablet   Oral   Take 325 mg by mouth daily.         Marland Kitchen buPROPion (WELLBUTRIN XL) 150 MG 24 hr tablet   Oral   Take 150 mg by mouth daily.         . diazepam (VALIUM) 2 MG tablet   Oral   Take 2 mg by mouth every 6 (six) hours  as needed for anxiety.          . fish oil-omega-3 fatty acids 1000 MG capsule   Oral   Take 1 g by mouth daily.          . Misc Natural Products (OSTEO BI-FLEX JOINT SHIELD PO)   Oral   Take 1 tablet by mouth daily.          . Multiple Vitamins-Minerals (MULTIVITAMIN PO)   Oral   Take 1 tablet by mouth daily.         . vitamin B-12 (CYANOCOBALAMIN) 1000 MCG tablet   Oral   Take 1,000 mcg by mouth daily.         . meclizine (ANTIVERT) 25 MG tablet   Oral   Take 1 tablet (25 mg total) by mouth 3 (three) times daily as needed for dizziness (vertigo).   30 tablet   0    BP 129/76  Pulse 70  Temp(Src) 98.4 F (36.9 C) (Oral)  Resp 20  Ht 5\' 9"  (1.753 m)  Wt 118 lb 14.4 oz (53.933 kg)  BMI 17.55 kg/m2  SpO2 100% Physical Exam  Nursing note and vitals reviewed. Constitutional:  Awake, alert, nontoxic appearance with baseline speech for  patient.  HENT:  Head: Atraumatic.  Mouth/Throat: No oropharyngeal exudate.  Eyes: EOM are normal. Pupils are equal, round, and reactive to light. Right eye exhibits no discharge. Left eye exhibits no discharge.  Neck: Neck supple.  Cardiovascular: Normal rate and regular rhythm.   No murmur heard. Pulmonary/Chest: Effort normal and breath sounds normal. No stridor. No respiratory distress. He has no wheezes. He has no rales. He exhibits no tenderness.  Abdominal: Soft. Bowel sounds are normal. He exhibits no mass. There is no tenderness. There is no rebound.  Musculoskeletal: He exhibits no tenderness.  Baseline ROM, moves extremities with no obvious new focal weakness.  Lymphadenopathy:    He has no cervical adenopathy.  Neurological: He is alert.  Awake, alert, cooperative and aware of situation; motor strength bilaterally; sensation normal to light touch bilaterally; peripheral visual fields full to confrontation; no facial asymmetry; tongue midline; major cranial nerves appear intact; no pronator drift, normal finger to nose  bilaterally, baseline gait without new ataxia.  Skin: No rash noted.  Psychiatric: He has a normal mood and affect.    ED Course  Patient / Family / Caregiver informed of clinical course, understand medical decision-making process, and agree with plan.d/w [facility] for admit. Procedures (including critical care time) ECG: Sinus rhythm, ventricular rate 70, normal axis, PAC, no acute ischemic changes noted, no comparison ECG immediately available Labs Reviewed  CBC WITH DIFFERENTIAL - Abnormal; Notable for the following:    RBC 3.86 (*)    Hemoglobin 12.3 (*)    HCT 33.9 (*)    MCHC 36.3 (*)    All other components within normal limits  COMPREHENSIVE METABOLIC PANEL - Abnormal; Notable for the following:    Sodium 128 (*)    Chloride 94 (*)    Glucose, Bld 104 (*)    All other components within normal limits  URINALYSIS, ROUTINE W REFLEX MICROSCOPIC - Abnormal; Notable for the following:    Specific Gravity, Urine 1.003 (*)    Ketones, ur 15 (*)    All other components within normal limits  ETHANOL  PROTIME-INR  APTT  TROPONIN I  URINE RAPID DRUG SCREEN (HOSP PERFORMED)  GLUCOSE, CAPILLARY  POCT I-STAT TROPONIN I   Ct Head Wo Contrast  11/08/2012   *RADIOLOGY REPORT*  Clinical Data: Dizziness, nausea and unsteady gait.  CT HEAD WITHOUT CONTRAST  Technique:  Contiguous axial images were obtained from the base of the skull through the vertex without contrast.  Comparison: Head CT scan 10/12/2007.  Findings: There is no evidence of acute intracranial abnormality including infarction, hemorrhage, mass lesion, mass effect, midline shift or abnormal extra-axial fluid collection.  There is no hydrocephalus or pneumocephalus.  The calvarium is intact.  Imaged paranasal sinuses and mastoid air cells are clear.  IMPRESSION: Negative exam.   Original Report Authenticated By: Holley Dexter, M.D.   Mr [facility] Wo Contrast  11/08/2012   *RADIOLOGY REPORT*  Clinical Data:  Episode involving onset  of ataxia, severe vertigo, and mild nausea.  MRI HEAD WITHOUT CONTRAST MRA HEAD WITHOUT CONTRAST  Technique:  Multiplanar, multiecho pulse sequences of the brain and surrounding structures were obtained without intravenous contrast. Angiographic images of the head were obtained using MRA technique without contrast.  Comparison:   None  MRI HEAD  Findings:  Mild generalized atrophy is advanced for age. Relatively minimal white matter disease is present.  No acute infarct, hemorrhage, mass lesion is present.  The ventricles are proportionate to the degree of atrophy.  No significant extra-axial fluid collection is present.  Flow is present in the major intracranial arteries.  The patient is status post bilateral lens extractions.  Scleral banding is noted on the right.  The paranasal sinuses and mastoid air cells are clear.  IMPRESSION:  1.  Mild generalized atrophy is advanced for age. 2.  No acute intracranial abnormality. 3.  Bilateral lens extractions and right scleral banding.  MRA HEAD  Findings: The internal carotid arteries are within normal limits from high cervical segments through the ICA termini bilaterally. The A1 and M1 segments are normal.  The anterior communicating artery is patent.  The MCA bifurcations are within normal limits. The ACA and MCA branch vessels are unremarkable.  The right vertebral artery is the dominant vessel.  The left vertebral artery essentially terminates at the PICA, extending only a very small branch to the vertebral basilar junction.  The left vertebral artery originates from the basilar tip with a posterior communicating artery.  The right vertebral artery is predominately fed from the right posterior communicating artery with a smaller right P1 segment.  There is some attenuation of distal PCA branch vessels, worse on the left.  IMPRESSION: Normal variant MRA circle of Willis without evidence for significant proximal stenosis, aneurysm, or branch vessel occlusion.    Original Report Authenticated By: Marin Roberts, M.D.   Mr Brain Wo Contrast  11/08/2012   *RADIOLOGY REPORT*  Clinical Data:  Episode involving onset of ataxia, severe vertigo, and mild nausea.  MRI HEAD WITHOUT CONTRAST MRA HEAD WITHOUT CONTRAST  Technique:  Multiplanar, multiecho pulse sequences of the brain and surrounding structures were obtained without intravenous contrast. Angiographic images of the head were obtained using MRA technique without contrast.  Comparison:   None  MRI HEAD  Findings:  Mild generalized atrophy is advanced for age. Relatively minimal white matter disease is present.  No acute infarct, hemorrhage, mass lesion is present.  The ventricles are proportionate to the degree of atrophy.  No significant extra-axial fluid collection is present.  Flow is present in the major intracranial arteries.  The patient is status post bilateral lens extractions.  Scleral banding is noted on the right.  The paranasal sinuses and mastoid air cells are clear.  IMPRESSION:  1.  Mild generalized atrophy is advanced for age. 2.  No acute intracranial abnormality. 3.  Bilateral lens extractions and right scleral banding.  MRA HEAD  Findings: The internal carotid arteries are within normal limits from high cervical segments through the ICA termini bilaterally. The A1 and M1 segments are normal.  The anterior communicating artery is patent.  The MCA bifurcations are within normal limits. The ACA and MCA branch vessels are unremarkable.  The right vertebral artery is the dominant vessel.  The left vertebral artery essentially terminates at the PICA, extending only a very small branch to the vertebral basilar junction.  The left vertebral artery originates from the basilar tip with a posterior communicating artery.  The right vertebral artery is predominately fed from the right posterior communicating artery with a smaller right P1 segment.  There is some attenuation of distal PCA branch vessels, worse on  the left.  IMPRESSION: Normal variant MRA circle of Willis without evidence for significant proximal stenosis, aneurysm, or branch vessel occlusion.   Original Report Authenticated By: Marin Roberts, M.D.   1. TIA (transient ischemic attack)   2. Ataxia   3. HTN (hypertension)   4. VBI (vertebrobasilar insufficiency)   5. Vertigo   6. Hyponatremia  7. Anxiety     MDM  The patient appears reasonably stabilized for admission considering the current resources, flow, and capabilities available in the ED at this time, and I doubt any other [facility] requiring further screening and/or treatment in the ED prior to admission.  Hurman Horn, MD 11/08/12 2116  Hurman Horn, MD 11/08/12 2128

## 2012-11-08 NOTE — Progress Notes (Signed)
Echocardiogram 2D Echocardiogram has been performed.  11/08/2012 11:41 AM Gertie Fey, RVT, RDCS, RDMS

## 2012-11-13 ENCOUNTER — Other Ambulatory Visit (INDEPENDENT_AMBULATORY_CARE_PROVIDER_SITE_OTHER): Payer: BC Managed Care – PPO

## 2012-11-13 ENCOUNTER — Other Ambulatory Visit: Payer: Self-pay | Admitting: *Deleted

## 2012-11-13 DIAGNOSIS — D649 Anemia, unspecified: Secondary | ICD-10-CM

## 2012-11-13 DIAGNOSIS — N400 Enlarged prostate without lower urinary tract symptoms: Secondary | ICD-10-CM

## 2012-11-13 DIAGNOSIS — R739 Hyperglycemia, unspecified: Secondary | ICD-10-CM

## 2012-11-13 DIAGNOSIS — R42 Dizziness and giddiness: Secondary | ICD-10-CM

## 2012-11-13 DIAGNOSIS — R110 Nausea: Secondary | ICD-10-CM

## 2012-11-13 DIAGNOSIS — R7309 Other abnormal glucose: Secondary | ICD-10-CM

## 2012-11-13 LAB — POCT CBC
Lymph, poc: 1.1 (ref 0.6–3.4)
MCH, POC: 31.4 pg — AB (ref 27–31.2)
MCHC: 33.5 g/dL (ref 31.8–35.4)
MCV: 93.8 fL (ref 80–97)
MPV: 7.8 fL (ref 0–99.8)
Platelet Count, POC: 214 10*3/uL (ref 142–424)
WBC: 3.8 10*3/uL — AB (ref 4.6–10.2)

## 2012-11-13 NOTE — Progress Notes (Signed)
Pt came in for labs only 

## 2012-11-14 LAB — BMP8+EGFR
BUN/Creatinine Ratio: 6 — ABNORMAL LOW (ref 10–22)
CO2: 25 mmol/L (ref 18–29)
Chloride: 94 mmol/L — ABNORMAL LOW (ref 97–108)
Creatinine, Ser: 0.87 mg/dL (ref 0.76–1.27)
GFR calc Af Amer: 105 mL/min/{1.73_m2} (ref >59)
Sodium: 135 mmol/L (ref 134–144)

## 2012-11-14 LAB — IRON AND TIBC
Iron Saturation: 32 % (ref 15–55)
TIBC: 294 ug/dL (ref 250–450)

## 2012-11-14 LAB — PSA, TOTAL AND FREE
PSA, Free Pct: 32.5 %
PSA, Free: 0.26 ng/mL
PSA: 0.8 ng/mL (ref 0.0–4.0)

## 2012-11-14 LAB — VITAMIN B12: Vitamin B-12: >1999 pg/mL — ABNORMAL HIGH (ref 211–946)

## 2012-11-14 LAB — THYROID PANEL WITH TSH: T3 Uptake Ratio: 28 % (ref 24–39)

## 2012-11-15 LAB — NMR, LIPOPROFILE
HDL Cholesterol by NMR: 75 mg/dL (ref >=40)
LP-IR Score: <25 (ref <=45)
Small LDL Particle Number: 104 nmol/L (ref <=527)

## 2012-11-29 ENCOUNTER — Encounter: Payer: Self-pay | Admitting: Family Medicine

## 2012-12-12 ENCOUNTER — Ambulatory Visit (INDEPENDENT_AMBULATORY_CARE_PROVIDER_SITE_OTHER): Payer: BC Managed Care – PPO | Admitting: Family Medicine

## 2012-12-12 ENCOUNTER — Encounter: Payer: Self-pay | Admitting: Family Medicine

## 2012-12-12 VITALS — BP 135/86 | HR 74 | Temp 97.2°F | Ht 69.0 in | Wt 131.0 lb

## 2012-12-12 DIAGNOSIS — D649 Anemia, unspecified: Secondary | ICD-10-CM

## 2012-12-12 DIAGNOSIS — F419 Anxiety disorder, unspecified: Secondary | ICD-10-CM

## 2012-12-12 DIAGNOSIS — F411 Generalized anxiety disorder: Secondary | ICD-10-CM

## 2012-12-12 DIAGNOSIS — I10 Essential (primary) hypertension: Secondary | ICD-10-CM

## 2012-12-12 DIAGNOSIS — N400 Enlarged prostate without lower urinary tract symptoms: Secondary | ICD-10-CM

## 2012-12-12 MED ORDER — DIAZEPAM 2 MG PO TABS: 2.0000 mg | ORAL_TABLET | Freq: Two times a day (BID) | ORAL | Status: DC | PRN

## 2012-12-12 NOTE — Progress Notes (Signed)
  Subjective:    Patient ID: Wesley Fowler, male    DOB: 27-Aug-1945, 67 y.o.   MRN: [id number]  HPI Patient returns to clinic today for followup of chronic medical problems and their management. These include vertigo, hypertension controlled without medication, GERD, and anxiety. As of note he was admitted to the hospital for an episode of dizziness in early August and he had subsequent scans of his brain which basically showed some atrophy but no other acute findings. Electrolytes at at that time revealed a low sodium but this has subsequently been rechecked and found to be within normal limits. Recent lab work done since his admission to the hospital was reviewed and cholesterol numbers liver function test kidney function tests were all good. His hemoglobin was slightly decreased but this is consistent with past readings. His home blood pressure readings are good and usually run 110 over the 60s. He complains of being a little more agitated and wishes to increase the Wellbutrin appear  Review of Systems  Constitutional: Negative.   HENT: Negative.   Eyes: Negative.   Respiratory: Negative.   Cardiovascular: Negative.   Gastrointestinal: Negative.   Endocrine: Negative.   Genitourinary: Negative.   Musculoskeletal: Positive for arthralgias (hips hurt at times).  Skin: Negative.   Allergic/Immunologic: Negative.   Neurological: Positive for dizziness (went to hosp for vertigo 1 mo ago).  Hematological: Negative.   Psychiatric/Behavioral: Positive for agitation (feels like med may not be strong enough).       Objective:   Physical Exam BP 135/86  Pulse 74  Temp(Src) 97.2 F (36.2 C) (Oral)  Ht 5\' 9"  (1.753 m)  Wt 131 lb (59.421 kg)  BMI 19.34 kg/m2  The patient appeared somewhat frail for his age but normally developed, alert and oriented to time and place. Speech, behavior and judgement appear normal. Vital signs as documented.  Head exam is unremarkable. No scleral icterus or  pallor noted. Ears nose mouth and throat were all within normal limit  Neck is without jugular venous distension, thyromegally, or carotid bruits. Carotid upstrokes are brisk bilaterally. No cervical adenopathy. Lungs are clear anteriorly and posteriorly to auscultation. Normal respiratory effort. Cardiac exam reveals regular rate and rhythm at 72 per minute. First and second heart sounds normal.  No murmurs, rubs or gallops.  Abdominal exam reveals normal bowl sounds, no masses, no organomegaly and no aortic enlargement. No inguinal adenopathy. Rectal exam revealed no masses a mildly enlarged prostate without lumps. Genitalia were normal without inguinal hernia Extremities are nonedematous and both femoral and pedal pulses are normal. Skin without pallor or jaundice.  Warm and dry, without rash. Neurologic exam reveals normal deep tendon reflexes and normal sensation.          Assessment & Plan:  HTN (hypertension), controlled without medication  Anemia  BPH (benign prostatic hypertrophy)  Anxiety and depression  Patient Instructions  Continue current treatment except increase Wellbutrin to 1 twice daily on Monday Wednesday and Friday, and 1 daily on all the other days. Continue therapeutic lifestyle changes Do not forget to get your flu shot in October Return FOBT     call in 3-4 weeks and let us know if the extra Wellbutrin is helping with the anxiety issues  Nyra Capes MD

## 2012-12-12 NOTE — Patient Instructions (Signed)
Continue current treatment except increase Wellbutrin to 1 twice daily on Monday Wednesday and Friday, and 1 daily on all the other days. Continue therapeutic lifestyle changes Do not forget to get your flu shot in October Return FOBT

## 2013-01-24 ENCOUNTER — Ambulatory Visit (INDEPENDENT_AMBULATORY_CARE_PROVIDER_SITE_OTHER): Payer: BC Managed Care – PPO | Admitting: Ophthalmology

## 2013-04-08 ENCOUNTER — Ambulatory Visit (INDEPENDENT_AMBULATORY_CARE_PROVIDER_SITE_OTHER): Payer: Medicare Other | Admitting: Ophthalmology

## 2013-04-08 DIAGNOSIS — H33009 Unspecified retinal detachment with retinal break, unspecified eye: Secondary | ICD-10-CM

## 2013-04-08 DIAGNOSIS — H3530 Unspecified macular degeneration: Secondary | ICD-10-CM

## 2013-04-08 DIAGNOSIS — H43819 Vitreous degeneration, unspecified eye: Secondary | ICD-10-CM

## 2013-04-08 DIAGNOSIS — H35379 Puckering of macula, unspecified eye: Secondary | ICD-10-CM

## 2013-06-10 ENCOUNTER — Ambulatory Visit: Payer: BC Managed Care – PPO | Admitting: Family Medicine

## 2013-06-12 ENCOUNTER — Other Ambulatory Visit: Payer: Self-pay | Admitting: Family Medicine

## 2013-06-13 NOTE — Telephone Encounter (Signed)
Last office visit was 12-12-13. Rx last filled on 03-22-13 for #[id number] with 2 rfs. Please advise. If approved please route to Pool A so nurse can phone in to pharmacy

## 2013-06-20 ENCOUNTER — Other Ambulatory Visit: Payer: Self-pay | Admitting: Family Medicine

## 2013-06-20 NOTE — Telephone Encounter (Signed)
Patient last seen in office on 12-12-12. Rx last filled on 04-01-13. Please advise. If approved please route to Pool A so nurse can phone in to pharmacy

## 2013-06-20 NOTE — Telephone Encounter (Signed)
Please call in valium with 0 refills 

## 2013-06-21 ENCOUNTER — Other Ambulatory Visit: Payer: Self-pay | Admitting: Family Medicine

## 2013-06-21 NOTE — Telephone Encounter (Signed)
Called in.

## 2013-10-23 ENCOUNTER — Telehealth: Payer: Self-pay | Admitting: Family Medicine

## 2013-10-23 DIAGNOSIS — Z0000 Encounter for general adult medical examination without abnormal findings: Secondary | ICD-10-CM

## 2013-10-23 DIAGNOSIS — I10 Essential (primary) hypertension: Secondary | ICD-10-CM

## 2013-10-24 NOTE — Telephone Encounter (Signed)
Orders placed.

## 2013-11-12 ENCOUNTER — Telehealth: Payer: Self-pay | Admitting: Family Medicine

## 2013-11-12 MED ORDER — DOXYCYCLINE HYCLATE 100 MG PO TABS: 100.0000 mg | ORAL_TABLET | Freq: Two times a day (BID) | ORAL | Status: DC

## 2013-11-12 MED ORDER — PROMETHAZINE HCL 25 MG PO TABS: ORAL_TABLET | ORAL | Status: DC

## 2013-11-12 NOTE — Telephone Encounter (Signed)
Please have patient come by and get blood work for Lyme disease and [facility] spotted fever and if the test comes back positive we can call in an antibiotic and something for nausea if necessary

## 2013-11-12 NOTE — Telephone Encounter (Signed)
Having HA, dizzy, and nausea, and fever- since all the bites - pt does not want to come by for labs - he would rather just be treated.  He has pulled from 2-5 ticks off every week for the last month.  [facility] approved phenergan and doxy  Sent to pharm To be re-signed by [facility]

## 2013-12-03 ENCOUNTER — Other Ambulatory Visit: Payer: Medicare Other

## 2013-12-05 ENCOUNTER — Other Ambulatory Visit (INDEPENDENT_AMBULATORY_CARE_PROVIDER_SITE_OTHER): Payer: Medicare Other

## 2013-12-05 DIAGNOSIS — I10 Essential (primary) hypertension: Secondary | ICD-10-CM

## 2013-12-05 DIAGNOSIS — Z0000 Encounter for general adult medical examination without abnormal findings: Secondary | ICD-10-CM

## 2013-12-05 LAB — POCT URINALYSIS DIPSTICK
Bilirubin, UA: NEGATIVE
Glucose, UA: NEGATIVE
Ketones, UA: NEGATIVE
Leukocytes, UA: NEGATIVE
Nitrite, UA: NEGATIVE
Protein, UA: NEGATIVE
RBC UA: NEGATIVE
SPEC GRAV UA: 1.01
Urobilinogen, UA: NEGATIVE
pH, UA: 5

## 2013-12-05 LAB — POCT CBC
Granulocyte percent: 71.6 %G (ref 37–80)
HEMATOCRIT: 40.3 % — AB (ref 43.5–53.7)
Hemoglobin: 13.5 g/dL — AB (ref 14.1–18.1)
Lymph, poc: 1 (ref 0.6–3.4)
MCH: 32.2 pg — AB (ref 27–31.2)
MCHC: 33.5 g/dL (ref 31.8–35.4)
MCV: 96.2 fL (ref 80–97)
MPV: 7.3 fL (ref 0–99.8)
POC GRANULOCYTE: 3.1 (ref 2–6.9)
POC LYMPH PERCENT: 22.6 %L (ref 10–50)
Platelet Count, POC: 243 10*3/uL (ref 142–424)
RBC: 4.2 M/uL — AB (ref 4.69–6.13)
RDW, POC: 11.8 %
WBC: 4.3 10*3/uL — AB (ref 4.6–10.2)

## 2013-12-05 LAB — POCT UA - MICROSCOPIC ONLY
BACTERIA, U MICROSCOPIC: NEGATIVE
CASTS, UR, LPF, POC: NEGATIVE
Crystals, Ur, HPF, POC: NEGATIVE
MUCUS UA: NEGATIVE
RBC, URINE, MICROSCOPIC: NEGATIVE
WBC, Ur, HPF, POC: NEGATIVE
Yeast, UA: NEGATIVE

## 2013-12-06 LAB — HEPATIC FUNCTION PANEL
ALK PHOS: 92 IU/L (ref 39–117)
ALT: 17 IU/L (ref 0–44)
AST: 23 IU/L (ref 0–40)
Albumin: 4.6 g/dL (ref 3.6–4.8)
BILIRUBIN DIRECT: 0.16 mg/dL (ref 0.00–0.40)
BILIRUBIN TOTAL: 0.6 mg/dL (ref 0.0–1.2)
TOTAL PROTEIN: 6.3 g/dL (ref 6.0–8.5)

## 2013-12-06 LAB — BMP8+EGFR
BUN / CREAT RATIO: 5 — AB (ref 10–22)
BUN: 4 mg/dL — ABNORMAL LOW (ref 8–27)
CO2: 23 mmol/L (ref 18–29)
CREATININE: 0.87 mg/dL (ref 0.76–1.27)
Calcium: 9.3 mg/dL (ref 8.6–10.2)
Chloride: 95 mmol/L — ABNORMAL LOW (ref 97–108)
GFR calc non Af Amer: 91 mL/min/{1.73_m2} (ref >59)
GFR, EST AFRICAN AMERICAN: 105 mL/min/{1.73_m2} (ref >59)
GLUCOSE: 91 mg/dL (ref 65–99)
Potassium: 4.7 mmol/L (ref 3.5–5.2)
Sodium: 132 mmol/L — ABNORMAL LOW (ref 134–144)

## 2013-12-06 LAB — THYROID PANEL WITH TSH
FREE THYROXINE INDEX: 2 (ref 1.2–4.9)
T3 UPTAKE RATIO: 28 % (ref 24–39)
T4, Total: 7.1 ug/dL (ref 4.5–12.0)
TSH: 1.27 u[IU]/mL (ref 0.450–4.500)

## 2013-12-06 LAB — LIPID PANEL
Chol/HDL Ratio: 1.9 ratio units (ref 0.0–5.0)
Cholesterol, Total: 184 mg/dL (ref 100–199)
HDL: 97 mg/dL (ref >39)
LDL Calculated: 76 mg/dL (ref 0–99)
TRIGLYCERIDES: 53 mg/dL (ref 0–149)
VLDL CHOLESTEROL CAL: 11 mg/dL (ref 5–40)

## 2013-12-06 LAB — PSA, TOTAL AND FREE
PSA, Free Pct: 34.3 %
PSA, Free: 0.24 ng/mL
PSA: 0.7 ng/mL (ref 0.0–4.0)

## 2013-12-06 LAB — VITAMIN D 25 HYDROXY (VIT D DEFICIENCY, FRACTURES): VIT D 25 HYDROXY: 49.2 ng/mL (ref 30.0–100.0)

## 2013-12-16 ENCOUNTER — Ambulatory Visit: Payer: Medicare Other | Admitting: Family Medicine

## 2013-12-23 ENCOUNTER — Telehealth: Payer: Self-pay | Admitting: Family Medicine

## 2013-12-25 ENCOUNTER — Ambulatory Visit (INDEPENDENT_AMBULATORY_CARE_PROVIDER_SITE_OTHER): Payer: Medicare Other | Admitting: Family Medicine

## 2013-12-25 ENCOUNTER — Ambulatory Visit (INDEPENDENT_AMBULATORY_CARE_PROVIDER_SITE_OTHER): Payer: Medicare Other

## 2013-12-25 ENCOUNTER — Encounter: Payer: Self-pay | Admitting: Family Medicine

## 2013-12-25 VITALS — BP 123/78 | HR 67 | Temp 97.7°F | Ht 69.0 in | Wt 125.0 lb

## 2013-12-25 DIAGNOSIS — R710 Precipitous drop in hematocrit: Secondary | ICD-10-CM

## 2013-12-25 DIAGNOSIS — R5381 Other malaise: Secondary | ICD-10-CM

## 2013-12-25 DIAGNOSIS — R5383 Other fatigue: Secondary | ICD-10-CM

## 2013-12-25 DIAGNOSIS — D649 Anemia, unspecified: Secondary | ICD-10-CM

## 2013-12-25 DIAGNOSIS — N400 Enlarged prostate without lower urinary tract symptoms: Secondary | ICD-10-CM

## 2013-12-25 DIAGNOSIS — I10 Essential (primary) hypertension: Secondary | ICD-10-CM

## 2013-12-25 DIAGNOSIS — D72819 Decreased white blood cell count, unspecified: Secondary | ICD-10-CM

## 2013-12-25 MED ORDER — MECLIZINE HCL 25 MG PO TABS: 25.0000 mg | ORAL_TABLET | Freq: Three times a day (TID) | ORAL | Status: DC | PRN

## 2013-12-25 MED ORDER — DIAZEPAM 2 MG PO TABS: ORAL_TABLET | ORAL | Status: DC

## 2013-12-25 NOTE — Patient Instructions (Addendum)
Medicare Annual Wellness Visit  [facility] and the medical providers at [facility] strive to bring you the best medical care.  In doing so we not only want to address your current medical conditions and concerns but also to detect new conditions early and prevent illness, disease and health-related problems.    Medicare offers a yearly Wellness Visit which allows our clinical staff to assess your need for preventative services including immunizations, lifestyle education, counseling to decrease risk of preventable diseases and screening for fall risk and other medical concerns.    This visit is provided free of charge (no copay) for all Medicare recipients. The clinical pharmacists at [facility] have begun to conduct these Wellness Visits which will also include a thorough review of all your medications.    As you primary medical provider recommend that you make an appointment for your Annual Wellness Visit if you have not done so already this year.  You may set up this appointment before you leave today or you may call back ([phone]) and schedule an appointment.  Please make sure when you call that you mention that you are scheduling your Annual Wellness Visit with the clinical pharmacist so that the appointment may be made for the proper length of time.     Continue current medications. Continue good therapeutic lifestyle changes which include good diet and exercise. Fall precautions discussed with patient. If an FOBT was given today- please return it to our front desk. If you are over 59 years old - you may need Prevnar 78 or the adult Pneumonia vaccine.  Flu Shots will be available at our office starting mid- September. Please call and schedule a FLU CLINIC APPOINTMENT.   Continue to take your multivitamin You will need a colonoscopy next year Please return the FOBT Please get the Prevnar vaccine if you have not had  it

## 2013-12-25 NOTE — Progress Notes (Signed)
Subjective:    Patient ID: Wesley Fowler., male    DOB: 09-18-1945, 68 y.o.   MRN: [id number]  HPI Pt here for follow up and management of chronic medical problems. The patient looks good and appears to be in good spirits. He is due for his annual exam today. Recent lab work was reviewed with the patient and everything appeared to be stable on this. He will be given an FOBT to return. He is requesting refills on his diazepam and meclizine. The patient has stopped his Wellbutrin and he had been on this for over 10 years. He feels okay without it and indicates that it may be causing some muscle cramping in his legs. Muscle cramping is now better.        Patient Active Problem List   Diagnosis Date Noted  . Vertigo 11/08/2012  . VBI (vertebrobasilar insufficiency) 11/08/2012  . HTN (hypertension), currently controlled without medication 11/08/2012  . Anxiety 11/08/2012   Outpatient Encounter Prescriptions as of 12/25/2013  Medication Sig  . acetaminophen (TYLENOL) 500 MG tablet Take 500 mg by mouth daily as needed for pain.  Marland Kitchen aspirin EC 325 MG tablet Take 325 mg by mouth daily.  . cholecalciferol (VITAMIN D) 1000 UNITS tablet Take 1,000 Units by mouth daily.  . diazepam (VALIUM) 2 MG tablet TAKE 1 TABLET EVERY 12 HOURS AS NEEDED FOR ANXIETY  . fish oil-omega-3 fatty acids 1000 MG capsule Take 1 g by mouth daily.   . Multiple Vitamins-Minerals (MULTIVITAMIN PO) Take 1 tablet by mouth daily.  . vitamin B-12 (CYANOCOBALAMIN) 1000 MCG tablet Take 1,000 mcg by mouth daily.  . [DISCONTINUED] buPROPion (WELLBUTRIN XL) 150 MG 24 hr tablet Take 150 mg by mouth daily.  . [DISCONTINUED] Misc Natural Products (OSTEO BI-FLEX JOINT SHIELD PO) Take 1 tablet by mouth daily.   . promethazine (PHENERGAN) 25 MG tablet Take 1/2 tab every 8 hours as needed for nausea.  . [DISCONTINUED] doxycycline (VIBRA-TABS) 100 MG tablet Take 1 tablet (100 mg total) by mouth 2 (two) times daily.    Review of  Systems  Constitutional: Negative.   HENT: Negative.   Eyes: Negative.   Respiratory: Negative.   Cardiovascular: Negative.   Gastrointestinal: Negative.   Endocrine: Negative.   Genitourinary: Negative.   Musculoskeletal: Negative.   Skin: Negative.   Allergic/Immunologic: Negative.   Neurological: Negative.   Hematological: Negative.   Psychiatric/Behavioral: Negative.        Objective:   Physical Exam  Nursing note and vitals reviewed. Constitutional: He is oriented to person, place, and time. No distress.  Patient is pleasant but BN and frail and somewhat older appearing than his stated age  HENT:  Head: Normocephalic and atraumatic.  Right Ear: External ear normal.  Left Ear: External ear normal.  Nose: Nose normal.  Mouth/Throat: Oropharynx is clear and moist. No oropharyngeal exudate.  Eyes: Conjunctivae and EOM are normal. Pupils are equal, round, and reactive to light. Right eye exhibits no discharge. Left eye exhibits no discharge. No scleral icterus.  Neck: Normal range of motion. Neck supple. No thyromegaly present.  Cardiovascular: Normal rate, regular rhythm, normal heart sounds and intact distal pulses.  Exam reveals no gallop and no friction rub.   No murmur heard. At 72 per minute  Pulmonary/Chest: Effort normal and breath sounds normal. No respiratory distress. He has no wheezes. He has no rales. He exhibits no tenderness.  Abdominal: Soft. Bowel sounds are normal. He exhibits no mass. There is no tenderness. There  is no rebound and no guarding.  Genitourinary: Rectum normal and penis normal.  The prostate was enlarged and soft without lumps or masses. There are no rectal masses. There is no inguinal hernia. There were no inguinal nodes. The external genitalia were normal.  Musculoskeletal: Normal range of motion. He exhibits no edema and no tenderness.  Lymphadenopathy:    He has no cervical adenopathy.  Neurological: He is alert and oriented to person,  place, and time. He has normal reflexes. No cranial nerve deficit.  Skin: Skin is warm and dry. No rash noted. No erythema. No pallor.  Psychiatric: He has a normal mood and affect. His behavior is normal. Judgment and thought content normal.  The patient appears somewhat more up beat than usual   BP 123/78  Pulse 67  Temp(Src) 97.7 F (36.5 C) (Oral)  Ht 5\' 9"  (1.753 m)  Wt 125 lb (56.7 kg)  BMI 18.45 kg/m2  [facility] reading (PRIMARY) by  Dr. Brunilda Payor x-ray--no active disease                                       Assessment & Plan:  1. Essential hypertension - DG Chest 2 View; Future  2. BPH (benign prostatic hyperplasia)  3. Decreased hemoglobin -Serum iron folate and B12 will be drawn -Return the FOBT  4. WBC decreased  5. Anemia, unspecified anemia type  Meds ordered this encounter  Medications  . cholecalciferol (VITAMIN D) 1000 UNITS tablet    Sig: Take 1,000 Units by mouth daily.  . diazepam (VALIUM) 2 MG tablet    Sig: TAKE 1 TABLET EVERY 12 HOURS AS NEEDED FOR ANXIETY    Dispense:  60 tablet    Refill:  5  . meclizine (ANTIVERT) 25 MG tablet    Sig: Take 1 tablet (25 mg total) by mouth 3 (three) times daily as needed for dizziness.    Dispense:  90 tablet    Refill:  1   Patient Instructions                       Medicare Annual Wellness Visit  [facility] and the medical providers at [facility] strive to bring you the best medical care.  In doing so we not only want to address your current medical conditions and concerns but also to detect new conditions early and prevent illness, disease and health-related problems.    Medicare offers a yearly Wellness Visit which allows our clinical staff to assess your need for preventative services including immunizations, lifestyle education, counseling to decrease risk of preventable diseases and screening for fall risk and other medical concerns.    This visit is provided free of charge (no  copay) for all Medicare recipients. The clinical pharmacists at [facility] have begun to conduct these Wellness Visits which will also include a thorough review of all your medications.    As you primary medical provider recommend that you make an appointment for your Annual Wellness Visit if you have not done so already this year.  You may set up this appointment before you leave today or you may call back ([phone]) and schedule an appointment.  Please make sure when you call that you mention that you are scheduling your Annual Wellness Visit with the clinical pharmacist so that the appointment may be made for the proper length of time.  Continue current medications. Continue good therapeutic lifestyle changes which include good diet and exercise. Fall precautions discussed with patient. If an FOBT was given today- please return it to our front desk. If you are over 71 years old - you may need Prevnar 66 or the adult Pneumonia vaccine.  Flu Shots will be available at our office starting mid- September. Please call and schedule a FLU CLINIC APPOINTMENT.   Continue to take your multivitamin You will need a colonoscopy next year Please return the FOBT Please get the Prevnar vaccine if you have not had it    Arrie Senate MD

## 2013-12-26 ENCOUNTER — Other Ambulatory Visit: Payer: Medicare Other

## 2013-12-26 ENCOUNTER — Telehealth: Payer: Self-pay

## 2013-12-26 DIAGNOSIS — Z1212 Encounter for screening for malignant neoplasm of rectum: Secondary | ICD-10-CM

## 2013-12-26 LAB — ANEMIA PROFILE B
Basophils Absolute: 0 10*3/uL (ref 0.0–0.2)
Basos: 1 %
EOS ABS: 0 10*3/uL (ref 0.0–0.4)
EOS: 1 %
FERRITIN: 85 ng/mL (ref 30–400)
FOLATE: 16.9 ng/mL (ref >3.0)
HCT: 37.1 % — ABNORMAL LOW (ref 37.5–51.0)
HEMOGLOBIN: 13.2 g/dL (ref 12.6–17.7)
IMMATURE GRANS (ABS): 0 10*3/uL (ref 0.0–0.1)
Immature Granulocytes: 0 %
Iron Saturation: 32 % (ref 15–55)
Iron: 106 ug/dL (ref 40–155)
LYMPHS: 24 %
Lymphocytes Absolute: 0.9 10*3/uL (ref 0.7–3.1)
MCH: 32.8 pg (ref 26.6–33.0)
MCHC: 35.6 g/dL (ref 31.5–35.7)
MCV: 92 fL (ref 79–97)
MONOS ABS: 0.3 10*3/uL (ref 0.1–0.9)
Monocytes: 9 %
NEUTROS PCT: 65 %
Neutrophils Absolute: 2.5 10*3/uL (ref 1.4–7.0)
PLATELETS: 232 10*3/uL (ref 150–379)
RBC: 4.03 x10E6/uL — AB (ref 4.14–5.80)
RDW: 12 % — ABNORMAL LOW (ref 12.3–15.4)
RETIC CT PCT: 0.9 % (ref 0.6–2.6)
TIBC: 327 ug/dL (ref 250–450)
UIBC: 221 ug/dL (ref 150–375)
WBC: 3.8 10*3/uL (ref 3.4–10.8)

## 2013-12-26 NOTE — Telephone Encounter (Signed)
Pt aware of CXR results.

## 2013-12-26 NOTE — Telephone Encounter (Signed)
Message copied by Waverly Ferrari on Thu Dec 26, 2013  9:41 AM ------      Message from: [name]      Created: Thu Dec 26, 2013  7:34 AM       All iron studies were within normal limits. These studies include serum iron iron saturation, ferritin, B12, and folate      The hemoglobin remains stable at 13.2. The platelets are adequate ------

## 2013-12-26 NOTE — Telephone Encounter (Signed)
Message copied by Koren Bound on Thu Dec 26, 2013  1:39 PM ------      Message from: [name]      Created: Wed Dec 25, 2013  5:13 PM       As per radiology report ------

## 2013-12-27 NOTE — Progress Notes (Signed)
Lab only 

## 2013-12-27 NOTE — Addendum Note (Signed)
Addended by: Earlene Plater on: 12/27/2013 09:35 AM   Modules accepted: Orders

## 2013-12-29 LAB — FECAL OCCULT BLOOD, IMMUNOCHEMICAL: Fecal Occult Bld: NEGATIVE

## 2013-12-31 ENCOUNTER — Encounter: Payer: Self-pay | Admitting: *Deleted

## 2014-01-01 ENCOUNTER — Encounter: Payer: Medicare Other | Admitting: Family Medicine

## 2014-04-08 ENCOUNTER — Ambulatory Visit (INDEPENDENT_AMBULATORY_CARE_PROVIDER_SITE_OTHER): Payer: Medicare HMO | Admitting: Ophthalmology

## 2014-04-08 DIAGNOSIS — H3531 Nonexudative age-related macular degeneration: Secondary | ICD-10-CM

## 2014-04-08 DIAGNOSIS — H43813 Vitreous degeneration, bilateral: Secondary | ICD-10-CM

## 2014-04-08 DIAGNOSIS — H338 Other retinal detachments: Secondary | ICD-10-CM

## 2014-04-08 DIAGNOSIS — H35371 Puckering of macula, right eye: Secondary | ICD-10-CM

## 2014-06-26 ENCOUNTER — Ambulatory Visit: Payer: Medicare Other | Admitting: Family Medicine

## 2014-09-02 ENCOUNTER — Encounter: Payer: Self-pay | Admitting: *Deleted

## 2014-09-05 ENCOUNTER — Other Ambulatory Visit: Payer: Self-pay | Admitting: Family Medicine

## 2014-09-05 NOTE — Telephone Encounter (Signed)
[facility] pt, last filled 06/07/14, last seen 01/2014, has appt in 01/2015. Call into [facility]

## 2014-09-05 NOTE — Telephone Encounter (Signed)
rx called into pharmacy

## 2014-09-05 NOTE — Telephone Encounter (Signed)
Please call in valium with 1 refills

## 2014-09-18 ENCOUNTER — Encounter: Payer: Self-pay | Admitting: Family Medicine

## 2014-10-10 ENCOUNTER — Encounter: Payer: Self-pay | Admitting: Family Medicine

## 2014-10-10 ENCOUNTER — Ambulatory Visit (INDEPENDENT_AMBULATORY_CARE_PROVIDER_SITE_OTHER): Payer: Medicare HMO | Admitting: Family Medicine

## 2014-10-10 ENCOUNTER — Encounter (INDEPENDENT_AMBULATORY_CARE_PROVIDER_SITE_OTHER): Payer: Self-pay

## 2014-10-10 VITALS — BP 125/83 | HR 72 | Temp 98.0°F | Ht 69.0 in | Wt 124.0 lb

## 2014-10-10 DIAGNOSIS — R5383 Other fatigue: Secondary | ICD-10-CM

## 2014-10-10 DIAGNOSIS — F329 Major depressive disorder, single episode, unspecified: Secondary | ICD-10-CM

## 2014-10-10 DIAGNOSIS — R012 Other cardiac sounds: Secondary | ICD-10-CM | POA: Diagnosis not present

## 2014-10-10 DIAGNOSIS — F32A Depression, unspecified: Secondary | ICD-10-CM

## 2014-10-10 DIAGNOSIS — R110 Nausea: Secondary | ICD-10-CM | POA: Diagnosis not present

## 2014-10-10 DIAGNOSIS — R42 Dizziness and giddiness: Secondary | ICD-10-CM

## 2014-10-10 LAB — POCT UA - MICROSCOPIC ONLY
BACTERIA, U MICROSCOPIC: NEGATIVE
CASTS, UR, LPF, POC: NEGATIVE
Crystals, Ur, HPF, POC: NEGATIVE
EPITHELIAL CELLS, URINE PER MICROSCOPY: NEGATIVE
MUCUS UA: NEGATIVE
RBC, urine, microscopic: NEGATIVE
WBC, Ur, HPF, POC: NEGATIVE
YEAST UA: NEGATIVE

## 2014-10-10 LAB — POCT URINALYSIS DIPSTICK
Bilirubin, UA: NEGATIVE
GLUCOSE UA: NEGATIVE
KETONES UA: NEGATIVE
LEUKOCYTES UA: NEGATIVE
Nitrite, UA: NEGATIVE
PH UA: 7
Protein, UA: NEGATIVE
RBC UA: NEGATIVE
Spec Grav, UA: <=1.005
Urobilinogen, UA: NEGATIVE

## 2014-10-10 LAB — POCT CBC
Granulocyte percent: 72.2 %G (ref 37–80)
HCT, POC: 37.8 % — AB (ref 43.5–53.7)
HEMOGLOBIN: 12.3 g/dL — AB (ref 14.1–18.1)
LYMPH, POC: 1.2 (ref 0.6–3.4)
MCH, POC: 30.5 pg (ref 27–31.2)
MCHC: 32.6 g/dL (ref 31.8–35.4)
MCV: 93.4 fL (ref 80–97)
MPV: 6.6 fL (ref 0–99.8)
POC Granulocyte: 4.2 (ref 2–6.9)
POC LYMPH PERCENT: 21.5 %L (ref 10–50)
Platelet Count, POC: 255 10*3/uL (ref 142–424)
RBC: 4.05 M/uL — AB (ref 4.69–6.13)
RDW, POC: 12.1 %
WBC: 5.8 10*3/uL (ref 4.6–10.2)

## 2014-10-10 MED ORDER — MECLIZINE HCL 25 MG PO TABS: 25.0000 mg | ORAL_TABLET | Freq: Three times a day (TID) | ORAL | Status: DC | PRN

## 2014-10-10 MED ORDER — DIAZEPAM 2 MG PO TABS: 2.0000 mg | ORAL_TABLET | Freq: Two times a day (BID) | ORAL | Status: DC | PRN

## 2014-10-10 NOTE — Progress Notes (Signed)
Subjective:    Patient ID: Wesley Fowler., male    DOB: September 23, 1945, 69 y.o.   MRN: [id number]  HPI Patient here today for dizziness and nausea that is occuring off and on. This started about 2-3 weeks ago. This is been going on for 3 days each week. He also has some weakness. He also has some headaches today. The nausea issues associated with the dizziness. He is requesting refills on his meclizine and diazepam. The patient says usually the meclizine helps but it has not helped this time. He only takes it if needed and it is 25 mg. He is also drinking 10-12 cups of coffee a day. He denies chest pain shortness of breath and notes that his bowels have not been quite as regular since he had his colonoscopy a couple months ago, which was by the way normal and does not need to be repeated for 10 years. Is passing his water without problems and says he is drinking plenty of fluids. Patient also brings in blood pressures for review and they're running in the low 100s over the 60-70 range. Into the record. The patient is also asking about restarting Wellbutrin. And this has to do with his moodiness at home and has been suggested by his wife that he restart something. He stopped the Wellbutrin previously because it caused leg cramps. I told him that we would not start anything until the dizziness is better.       Patient Active Problem List   Diagnosis Date Noted  . Vertigo 11/08/2012  . VBI (vertebrobasilar insufficiency) 11/08/2012  . HTN (hypertension), currently controlled without medication 11/08/2012  . Anxiety 11/08/2012   Outpatient Encounter Prescriptions as of 10/10/2014  Medication Sig  . aspirin EC 325 MG tablet Take 325 mg by mouth daily.  . cholecalciferol (VITAMIN D) 1000 UNITS tablet Take 1,000 Units by mouth daily.  . diazepam (VALIUM) 2 MG tablet TAKE ONE TABLET BY MOUTH EVERY 12 HOURS AS NEEDED  . fish oil-omega-3 fatty acids 1000 MG capsule Take 1 g by mouth daily.   .  meclizine (ANTIVERT) 25 MG tablet Take 1 tablet (25 mg total) by mouth 3 (three) times daily as needed for dizziness.  . vitamin B-12 (CYANOCOBALAMIN) 1000 MCG tablet Take 1,000 mcg by mouth daily.  . promethazine (PHENERGAN) 25 MG tablet Take 1/2 tab every 8 hours as needed for nausea. (Patient not taking: Reported on 10/10/2014)  . [DISCONTINUED] acetaminophen (TYLENOL) 500 MG tablet Take 500 mg by mouth daily as needed for pain.  . [DISCONTINUED] Multiple Vitamins-Minerals (MULTIVITAMIN PO) Take 1 tablet by mouth daily.   No facility-administered encounter medications on file as of 10/10/2014.     Review of Systems  Constitutional: Negative.   Eyes: Negative.   Respiratory: Negative.   Cardiovascular: Negative.   Gastrointestinal: Positive for nausea (caused by the dizziness).  Endocrine: Negative.   Genitourinary: Negative.   Musculoskeletal: Negative.   Skin: Negative.   Allergic/Immunologic: Negative.   Neurological: Positive for dizziness and headaches (today - not typically).  Hematological: Negative.   Psychiatric/Behavioral: Negative.        Objective:   Physical Exam  Constitutional: He is oriented to person, place, and time. No distress.  Very thin and somewhat older appearing than his stated age but alert  HENT:  Head: Normocephalic and atraumatic.  Right Ear: External ear normal.  Left Ear: External ear normal.  Nose: Nose normal.  Mouth/Throat: Oropharynx is clear and moist. No oropharyngeal exudate.  Eyes: Conjunctivae and EOM are normal. Pupils are equal, round, and reactive to light. Right eye exhibits no discharge. Left eye exhibits no discharge. No scleral icterus.  Neck: Normal range of motion. Neck supple. No thyromegaly present.  No carotid bruits anterior cervical adenopathy or thyromegaly  Cardiovascular: Normal rate, regular rhythm, normal heart sounds and intact distal pulses.   No murmur heard. Heart has a regular rate and rhythm at 72/m with a split  S2 sitting which goes away with being supine  Pulmonary/Chest: Effort normal and breath sounds normal. No respiratory distress. He has no wheezes. He has no rales. He exhibits no tenderness.  Abdominal: Soft. Bowel sounds are normal. He exhibits no mass. There is no tenderness. There is no rebound and no guarding.  Genitourinary: Rectum normal.  Musculoskeletal: Normal range of motion. He exhibits no edema or tenderness.  Lymphadenopathy:    He has no cervical adenopathy.  Neurological: He is alert and oriented to person, place, and time. He has normal reflexes. No cranial nerve deficit.  Skin: Skin is warm and dry. No rash noted. No erythema. No pallor.  Psychiatric: He has a normal mood and affect. His behavior is normal. Judgment and thought content normal.  Nursing note and vitals reviewed.  BP 125/83 mmHg  Pulse 72  Temp(Src) 98 F (36.7 C) (Oral)  Ht _0  (1.753 m)  Wt 124 lb (56.246 kg)  BMI 18.30 kg/m2  Results for orders placed or performed in visit on 10/10/14  POCT CBC  Result Value Ref Range   WBC 5.8 4.6 - 10.2 K/uL   Lymph, poc 1.2 0.6 - 3.4   POC LYMPH PERCENT 21.5 10 - 50 %L   POC Granulocyte 4.2 2 - 6.9   Granulocyte percent 72.2 37 - 80 %G   RBC 4.05 (A) 4.69 - 6.13 M/uL   Hemoglobin 12.3 (A) 14.1 - 18.1 g/dL   HCT, POC 37.8 (A) 43.5 - 53.7 %   MCV 93.4 80 - 97 fL   MCH, POC 30.5 27 - 31.2 pg   MCHC 32.6 31.8 - 35.4 g/dL   RDW, POC 12.1 %   Platelet Count, POC 255 142 - 424 K/uL   MPV 6.6 0 - 99.8 fL  POCT urinalysis dipstick  Result Value Ref Range   Color, UA straw    Clarity, UA clear    Glucose, UA neg    Bilirubin, UA neg    Ketones, UA neg    Spec Grav, UA <=1.005    Blood, UA neg    pH, UA 7.0    Protein, UA neg    Urobilinogen, UA negative    Nitrite, UA neg    Leukocytes, UA Negative Negative  POCT UA - Microscopic Only  Result Value Ref Range   WBC, Ur, HPF, POC neg    RBC, urine, microscopic neg    Bacteria, U Microscopic neg     Mucus, UA neg    Epithelial cells, urine per micros neg    Crystals, Ur, HPF, POC neg    Casts, Ur, LPF, POC neg    Yeast, UA neg          Assessment & Plan:  1. Dizziness -We will check lab work and a urinalysis and we will asked the patient to take a half of a 25 mg meclizine 3 or 4 times daily with food for 1 week -We will also ask him to decrease his caffeine intake and drink more water - POCT  CBC - BMP8+EGFR - Thyroid Panel With TSH - POCT urinalysis dipstick - POCT UA - Microscopic Only  2. Other fatigue -We will check his hemoglobin and thyroid and electrolytes - POCT CBC - BMP8+EGFR - Thyroid Panel With TSH - POCT urinalysis dipstick - POCT UA - Microscopic Only  3. Nausea without vomiting -We will work on the nausea by helping the dizziness with the meclizine - POCT CBC - BMP8+EGFR - Thyroid Panel With TSH - POCT urinalysis dipstick - POCT UA - Microscopic Only  4. Split S2 (second heart sound) -The patient is having no chest pain or chest discomfort. -In the past 2 years the patient has had an echocardiogram which was unremarkable -We will discuss the split S2 with a cardiologist to decide if there is anything else we need to do to follow-up on this  5. Depression -When the dizziness is better we will consider trying Lexapro or Cymbalta at a low dose  Meds ordered this encounter  Medications  . meclizine (ANTIVERT) 25 MG tablet    Sig: Take 1 tablet (25 mg total) by mouth 3 (three) times daily as needed for dizziness.    Dispense:  90 tablet    Refill:  1  . diazepam (VALIUM) 2 MG tablet    Sig: Take 1 tablet (2 mg total) by mouth every 12 (twelve) hours as needed.    Dispense:  60 tablet    Refill:  1   Patient Instructions  Continue to drink plenty of fluids especially water Decrease caffeine intake as this is a diuretic and could be making you more dehydrated. Take the meclizine more regularly for 1 week one half peel 3-4 times daily with meals  and at bedtime. Call in 7 to 10 days and let us know how you are doing. At that time if you are feeling better with the dizziness we may consider trying some low-dose Lexapro or Cymbalta for your depression. We will discuss the split S2 heart sound with a cardiologist to see if this has any significance.   Arrie Senate MD

## 2014-10-10 NOTE — Patient Instructions (Signed)
Continue to drink plenty of fluids especially water Decrease caffeine intake as this is a diuretic and could be making you more dehydrated. Take the meclizine more regularly for 1 week one half peel 3-4 times daily with meals and at bedtime. Call in 7 to 10 days and let us know how you are doing. At that time if you are feeling better with the dizziness we may consider trying some low-dose Lexapro or Cymbalta for your depression. We will discuss the split S2 heart sound with a cardiologist to see if this has any significance.

## 2014-10-11 LAB — THYROID PANEL WITH TSH
Free Thyroxine Index: 1.6 (ref 1.2–4.9)
T3 Uptake Ratio: 23 % — ABNORMAL LOW (ref 24–39)
T4 TOTAL: 7 ug/dL (ref 4.5–12.0)
TSH: 1.61 u[IU]/mL (ref 0.450–4.500)

## 2014-10-11 LAB — BMP8+EGFR
BUN/Creatinine Ratio: 5 — ABNORMAL LOW (ref 10–22)
BUN: 4 mg/dL — ABNORMAL LOW (ref 8–27)
CHLORIDE: 92 mmol/L — AB (ref 97–108)
CO2: 24 mmol/L (ref 18–29)
Calcium: 9 mg/dL (ref 8.6–10.2)
Creatinine, Ser: 0.76 mg/dL (ref 0.76–1.27)
GFR calc non Af Amer: 95 mL/min/{1.73_m2} (ref >59)
GFR, EST AFRICAN AMERICAN: 110 mL/min/{1.73_m2} (ref >59)
GLUCOSE: 89 mg/dL (ref 65–99)
Potassium: 3.9 mmol/L (ref 3.5–5.2)
Sodium: 130 mmol/L — ABNORMAL LOW (ref 134–144)

## 2014-10-14 ENCOUNTER — Telehealth: Payer: Self-pay | Admitting: *Deleted

## 2014-10-14 ENCOUNTER — Telehealth: Payer: Self-pay | Admitting: Family Medicine

## 2014-10-14 NOTE — Telephone Encounter (Signed)
-----   Message from [name], MD sent at 10/11/2014  9:42 AM EDT ----- The blood sugar is good at 89. The creatinine, the most important kidney function test is within normal limits. The sodium and chloride are low as they have been in the past------ please repeat this BMP in a couple of weeks Already function tests are within normal limits Please reduce caffeine intake as much as possible

## 2014-10-14 NOTE — Telephone Encounter (Signed)
Pt aware of lab results. Will talk w/ Dr. Laurance Flatten about his TC to Dr. Wynonia Lawman about pt

## 2014-10-14 NOTE — Progress Notes (Signed)
Pt aware of lab results, copy being mailed to pt.

## 2014-10-16 NOTE — Telephone Encounter (Signed)
Did speak with the cardiologist and he felt that the split S2 sound was of no significance with no other history to go along with this. I did call the patient and he was made aware of my conversation with Dr. Wynonia Lawman.

## 2014-10-16 NOTE — Telephone Encounter (Signed)
Patient is calling to see if we are going to refer to cardiologist?

## 2014-11-20 ENCOUNTER — Telehealth: Payer: Self-pay | Admitting: *Deleted

## 2014-11-20 MED ORDER — BUPROPION HCL ER (XL) 150 MG PO TB24: 150.0000 mg | ORAL_TABLET | Freq: Every day | ORAL | Status: DC

## 2014-11-20 NOTE — Telephone Encounter (Signed)
This is okay to refill with 5 refills on that

## 2014-11-20 NOTE — Telephone Encounter (Signed)
Pt calls and states that at the last visit you had talked about a antidepressant. He would like to go back on the one from 2014 which was Wellbutrin XL 150mg  daily. If this is appropriate we can send it to [facility] in [place]

## 2014-12-31 ENCOUNTER — Encounter: Payer: Medicare HMO | Admitting: Family Medicine

## 2015-01-06 ENCOUNTER — Encounter: Payer: Medicare HMO | Admitting: Family Medicine

## 2015-03-19 ENCOUNTER — Ambulatory Visit (INDEPENDENT_AMBULATORY_CARE_PROVIDER_SITE_OTHER): Payer: Medicare HMO | Admitting: Family Medicine

## 2015-03-19 ENCOUNTER — Encounter: Payer: Self-pay | Admitting: Family Medicine

## 2015-03-19 VITALS — BP 138/87 | HR 75 | Temp 96.9°F | Ht 69.0 in | Wt 129.0 lb

## 2015-03-19 DIAGNOSIS — R42 Dizziness and giddiness: Secondary | ICD-10-CM

## 2015-03-19 MED ORDER — DIAZEPAM 2 MG PO TABS: 2.0000 mg | ORAL_TABLET | Freq: Two times a day (BID) | ORAL | Status: DC | PRN

## 2015-03-19 MED ORDER — MECLIZINE HCL 25 MG PO TABS: 25.0000 mg | ORAL_TABLET | Freq: Three times a day (TID) | ORAL | Status: DC | PRN

## 2015-03-19 NOTE — Progress Notes (Signed)
Subjective:    Patient ID: Wesley Fowler., male    DOB: [birth year], 69 y.o.   MRN: [id number]  HPI Patient here today for dizziness that started a few days ago. Today, he states it is better. Patient has had some sinus pressure and headaches in addition to the dizziness. The patient has had some a recent cold and has had some ear cerumen which she just removed recently from the right ear canal. His blood pressures at home have been running in the 108/60 range and this is consistent. The patient denies any chest pain or shortness of breath has not noticed his heart being irregular. He is not having any GI symptoms. He's not having trouble passing his water.     Patient Active Problem List   Diagnosis Date Noted  . Vertigo 11/08/2012  . VBI (vertebrobasilar insufficiency) 11/08/2012  . HTN (hypertension), currently controlled without medication 11/08/2012  . Anxiety 11/08/2012   Outpatient Encounter Prescriptions as of 03/19/2015  Medication Sig  . aspirin EC 325 MG tablet Take 325 mg by mouth daily.  . cholecalciferol (VITAMIN D) 1000 UNITS tablet Take 1,000 Units by mouth daily.  . diazepam (VALIUM) 2 MG tablet Take 1 tablet (2 mg total) by mouth every 12 (twelve) hours as needed.  . fish oil-omega-3 fatty acids 1000 MG capsule Take 1 g by mouth daily.   . meclizine (ANTIVERT) 25 MG tablet Take 1 tablet (25 mg total) by mouth 3 (three) times daily as needed for dizziness.  . vitamin B-12 (CYANOCOBALAMIN) 1000 MCG tablet Take 1,000 mcg by mouth daily.  . [DISCONTINUED] buPROPion (WELLBUTRIN XL) 150 MG 24 hr tablet Take 1 tablet (150 mg total) by mouth daily.  . [DISCONTINUED] diazepam (VALIUM) 2 MG tablet Take 1 tablet (2 mg total) by mouth every 12 (twelve) hours as needed.  . [DISCONTINUED] meclizine (ANTIVERT) 25 MG tablet Take 1 tablet (25 mg total) by mouth 3 (three) times daily as needed for dizziness.  . promethazine (PHENERGAN) 25 MG tablet Take 1/2 tab every 8 hours as needed  for nausea. (Patient not taking: Reported on 03/19/2015)   No facility-administered encounter medications on file as of 03/19/2015.      Review of Systems  Constitutional: Negative.   HENT: Positive for sinus pressure.   Eyes: Negative.   Respiratory: Negative.   Cardiovascular: Negative.   Gastrointestinal: Negative.   Endocrine: Negative.   Genitourinary: Negative.   Musculoskeletal: Negative.   Skin: Negative.   Allergic/Immunologic: Negative.   Neurological: Positive for dizziness and headaches.  Hematological: Negative.   Psychiatric/Behavioral: Negative.        Objective:   Physical Exam  Constitutional: He is oriented to person, place, and time. No distress.  Then small framed and somewhat kyphotic but alert  HENT:  Head: Normocephalic and atraumatic.  Right Ear: External ear normal.  Left Ear: External ear normal.  Nose: Nose normal.  Mouth/Throat: Oropharynx is clear and moist. No oropharyngeal exudate.  Nasal passages are clear with no turbinate swelling or drainage Both ear canals are clear of cerumen and there is no sign of any eardrum infection  Eyes: Conjunctivae and EOM are normal. Pupils are equal, round, and reactive to light. Right eye exhibits no discharge. Left eye exhibits no discharge. No scleral icterus.  Neck: Normal range of motion. Neck supple. No thyromegaly present.  No anterior cervical adenopathy  Cardiovascular: Normal rate and regular rhythm.   No murmur heard. The heart has a regular rate and rhythm  at 72/m  Pulmonary/Chest: Effort normal and breath sounds normal. No respiratory distress. He has no wheezes. He has no rales.  Clear anteriorly and posteriorly and no axillary adenopathy  Abdominal: Soft. Bowel sounds are normal. He exhibits no mass. There is no tenderness. There is no rebound and no guarding.  No liver or spleen enlargement no bruits and no abdominal masses  Musculoskeletal: Normal range of motion. He exhibits no edema.    Lymphadenopathy:    He has no cervical adenopathy.  Neurological: He is alert and oriented to person, place, and time.  Skin: Skin is warm and dry. No rash noted.  Psychiatric: He has a normal mood and affect. His behavior is normal. Judgment and thought content normal.  Nursing note and vitals reviewed.   BP 138/87 mmHg  Pulse 75  Temp(Src) 96.9 F (36.1 C) (Oral)  Ht 5\' 9"  (1.753 m)  Wt 129 lb (58.514 kg)  BMI 19.04 kg/m2       Assessment & Plan:  1. Dizziness -Continue with nasal saline irrigation of sinuses -Continue to remain well hydrated and keep the house as cool as possible and use a cool mist humidifier in the bedroom at nighttime -Take meclizine as needed -Return to clinic if dizziness persists  Meds ordered this encounter  Medications  . diazepam (VALIUM) 2 MG tablet    Sig: Take 1 tablet (2 mg total) by mouth every 12 (twelve) hours as needed.    Dispense:  60 tablet    Refill:  1  . meclizine (ANTIVERT) 25 MG tablet    Sig: Take 1 tablet (25 mg total) by mouth 3 (three) times daily as needed for dizziness.    Dispense:  90 tablet    Refill:  1   Patient Instructions  Continue with nasal saline irrigations Use a cool mist humidifier at home and keep the house as cool as possible Continue to monitor blood pressures at home Take meclizine as needed for dizziness If the need arises call and we will refill your Wellbutrin   Arrie Senate MD

## 2015-03-19 NOTE — Patient Instructions (Signed)
Continue with nasal saline irrigations Use a cool mist humidifier at home and keep the house as cool as possible Continue to monitor blood pressures at home Take meclizine as needed for dizziness If the need arises call and we will refill your Wellbutrin

## 2015-04-13 ENCOUNTER — Ambulatory Visit (INDEPENDENT_AMBULATORY_CARE_PROVIDER_SITE_OTHER): Payer: Medicare HMO | Admitting: Ophthalmology

## 2015-04-20 ENCOUNTER — Ambulatory Visit (INDEPENDENT_AMBULATORY_CARE_PROVIDER_SITE_OTHER): Payer: Medicare HMO | Admitting: Ophthalmology

## 2015-04-20 DIAGNOSIS — H353112 Nonexudative age-related macular degeneration, right eye, intermediate dry stage: Secondary | ICD-10-CM | POA: Diagnosis not present

## 2015-04-20 DIAGNOSIS — H353121 Nonexudative age-related macular degeneration, left eye, early dry stage: Secondary | ICD-10-CM | POA: Diagnosis not present

## 2015-04-20 DIAGNOSIS — H43813 Vitreous degeneration, bilateral: Secondary | ICD-10-CM | POA: Diagnosis not present

## 2015-05-28 ENCOUNTER — Telehealth: Payer: Self-pay | Admitting: Family Medicine

## 2015-05-28 DIAGNOSIS — I10 Essential (primary) hypertension: Secondary | ICD-10-CM

## 2015-05-28 DIAGNOSIS — Z125 Encounter for screening for malignant neoplasm of prostate: Secondary | ICD-10-CM

## 2015-05-28 NOTE — Telephone Encounter (Signed)
Patient aware he may come on 3/2 or 3/3.

## 2015-06-02 ENCOUNTER — Other Ambulatory Visit (INDEPENDENT_AMBULATORY_CARE_PROVIDER_SITE_OTHER): Payer: Medicare HMO

## 2015-06-02 DIAGNOSIS — I10 Essential (primary) hypertension: Secondary | ICD-10-CM | POA: Diagnosis not present

## 2015-06-02 DIAGNOSIS — Z125 Encounter for screening for malignant neoplasm of prostate: Secondary | ICD-10-CM

## 2015-06-02 LAB — HEPATIC FUNCTION PANEL
ALBUMIN: 4.5 g/dL (ref 3.6–5.1)
ALK PHOS: 95 U/L (ref 40–115)
ALT: 16 U/L (ref 9–46)
AST: 18 U/L (ref 10–35)
BILIRUBIN TOTAL: 0.9 mg/dL (ref 0.2–1.2)
Bilirubin, Direct: 0.2 mg/dL (ref <=0.2)
Indirect Bilirubin: 0.7 mg/dL (ref 0.2–1.2)
Total Protein: 6.7 g/dL (ref 6.1–8.1)

## 2015-06-02 LAB — LIPID PANEL
Cholesterol: 195 mg/dL (ref 125–200)
HDL: 90 mg/dL (ref >=40)
LDL CALC: 94 mg/dL (ref <130)
TRIGLYCERIDES: 53 mg/dL (ref <150)
Total CHOL/HDL Ratio: 2.2 Ratio (ref <=5.0)
VLDL: 11 mg/dL (ref <30)

## 2015-06-02 LAB — BASIC METABOLIC PANEL WITH GFR
BUN: 6 mg/dL — ABNORMAL LOW (ref 7–25)
CALCIUM: 9.1 mg/dL (ref 8.6–10.3)
CO2: 24 mmol/L (ref 20–31)
Chloride: 94 mmol/L — ABNORMAL LOW (ref 98–110)
Creat: 0.8 mg/dL (ref 0.70–1.25)
Glucose, Bld: 99 mg/dL (ref 70–99)
POTASSIUM: 4.1 mmol/L (ref 3.5–5.3)
SODIUM: 130 mmol/L — AB (ref 135–146)

## 2015-06-02 NOTE — Progress Notes (Signed)
Lab only 

## 2015-06-03 LAB — VITAMIN D 25 HYDROXY (VIT D DEFICIENCY, FRACTURES): VIT D 25 HYDROXY: 36 ng/mL (ref 30–100)

## 2015-06-03 LAB — PSA, TOTAL AND FREE
PSA FREE PCT: 33 % (ref >25)
PSA FREE: 0.32 ng/mL
PSA: 0.96 ng/mL (ref <=4.00)

## 2015-06-19 ENCOUNTER — Ambulatory Visit (INDEPENDENT_AMBULATORY_CARE_PROVIDER_SITE_OTHER): Payer: Medicare HMO | Admitting: Family Medicine

## 2015-06-19 ENCOUNTER — Encounter: Payer: Self-pay | Admitting: Family Medicine

## 2015-06-19 VITALS — BP 136/84 | HR 76 | Temp 97.5°F | Ht 69.0 in | Wt 131.0 lb

## 2015-06-19 DIAGNOSIS — J0100 Acute maxillary sinusitis, unspecified: Secondary | ICD-10-CM

## 2015-06-19 DIAGNOSIS — M25572 Pain in left ankle and joints of left foot: Secondary | ICD-10-CM | POA: Diagnosis not present

## 2015-06-19 MED ORDER — AMOXICILLIN-POT CLAVULANATE 875-125 MG PO TABS: 1.0000 | ORAL_TABLET | Freq: Two times a day (BID) | ORAL | Status: DC

## 2015-06-19 NOTE — Progress Notes (Signed)
Subjective:  Patient ID: Wesley Dolores., male    DOB: [birth year]  Age: 70 y.o. MRN: [id number]  CC: Foot Pain   HPI Wesley Dispenza. presents for No known injury. Just woke up yesterday morning with swelling and pain at the lateral midfoot of the left foot. He cannot recall any impetus for the pain except that he is on his feet a lot daily but no more than usual recently. The pain is moderate he points to the prominence of the fifth metatarsal and traces the line of the fifth metatarsal distally. There is full range of motion. There is pain with ambulation. Symptoms have been stable since onset with some worsening during ambulation.  Patient presents with upper respiratory congestion. Rhinorrhea that is frequently purulent. There is moderate sore throat. Patient reports coughing frequently as well.-colored/purulent sputum noted. There is no fever no chills no sweats. The patient denies being short of breath. Onset was 3-5 days ago. Gradually worsening in spite of home remedies.    History Wesley Fowler has a past medical history of History of high blood pressure; Arthritis; and Anxiety.   He has past surgical history that includes Retinal detachment surgery (2004); Knee arthroscopy (2004); and Laparoscopic inguinal hernia repair (03/15/11).   His family history includes Lung cancer in his maternal grandfather.He reports that he has quit smoking. He does not have any smokeless tobacco history on file. He reports that he does not drink alcohol or use illicit drugs.    ROS Review of Systems  Constitutional: Negative for fever, chills, activity change and appetite change.  HENT: Positive for congestion, postnasal drip, rhinorrhea and sinus pressure (Over the maxillary sinuses bilaterally). Negative for ear discharge, ear pain, hearing loss, nosebleeds, sneezing and trouble swallowing.   Respiratory: Negative for chest tightness and shortness of breath.   Cardiovascular: Negative for chest pain and  palpitations.  Musculoskeletal: Positive for joint swelling and arthralgias. Negative for back pain.  Skin: Negative for rash.    Objective:  BP 136/84 mmHg  Pulse 76  Temp(Src) 97.5 F (36.4 C) (Oral)  Ht 5\' 9"  (1.753 m)  Wt 131 lb (59.421 kg)  BMI 19.34 kg/m2  BP Readings from Last 3 Encounters:  06/19/15 136/84  03/19/15 138/87  10/10/14 125/83    Wt Readings from Last 3 Encounters:  06/19/15 131 lb (59.421 kg)  03/19/15 129 lb (58.514 kg)  10/10/14 124 lb (56.246 kg)     Physical Exam  Constitutional: He is oriented to person, place, and time. He appears well-developed and well-nourished.  HENT:  Head: Normocephalic and atraumatic.  Right Ear: Tympanic membrane and external ear normal. No decreased hearing is noted.  Left Ear: Tympanic membrane and external ear normal. No decreased hearing is noted.  Mouth/Throat: No oropharyngeal exudate or posterior oropharyngeal erythema.  Eyes: Pupils are equal, round, and reactive to light.  Neck: Normal range of motion. Neck supple.  Cardiovascular: Normal rate and regular rhythm.   No murmur heard. Pulmonary/Chest: Breath sounds normal. No respiratory distress.  Abdominal: Soft. Bowel sounds are normal. He exhibits no mass. There is no tenderness.  Musculoskeletal: Normal range of motion. He exhibits tenderness (at the left fifth MTP laterally. Visually inspection is normal with exception of some joint hypertrophy consistent with arthritis of the joint or older injury). He exhibits no edema.  Neurological: He is alert and oriented to person, place, and time.  Vitals reviewed.    Lab Results  Component Value Date   WBC 5.8 10/10/2014  HGB 12.3* 10/10/2014   HCT 37.8* 10/10/2014   PLT 232 12/25/2013   GLUCOSE 99 06/02/2015   CHOL 195 06/02/2015   TRIG 53 06/02/2015   HDL 90 06/02/2015   LDLCALC 94 06/02/2015   ALT 16 06/02/2015   AST 18 06/02/2015   NA 130* 06/02/2015   K 4.1 06/02/2015   CL 94* 06/02/2015    CREATININE 0.80 06/02/2015   BUN 6* 06/02/2015   CO2 24 06/02/2015   TSH 1.610 10/10/2014   PSA 0.96 06/02/2015   INR 1.02 11/08/2012   HGBA1C 4.5 11/13/2012      Assessment & Plan:   Wesley Fowler was seen today for foot pain.  Diagnoses and all orders for this visit:  Pain in joint, ankle and foot, left  Acute maxillary sinusitis, recurrence not specified  Other orders -     amoxicillin-clavulanate (AUGMENTIN) 875-125 MG tablet; Take 1 tablet by mouth 2 (two) times daily. Take all of this medication    Patient declines x-ray and declines a brace for the foot  I have discontinued Wesley Fowler vitamin B-12 and promethazine. I am also having him start on amoxicillin-clavulanate. Additionally, I am having him maintain his fish oil-omega-3 fatty acids, aspirin EC, cholecalciferol, diazepam, and meclizine.  Meds ordered this encounter  Medications  . amoxicillin-clavulanate (AUGMENTIN) 875-125 MG tablet    Sig: Take 1 tablet by mouth 2 (two) times daily. Take all of this medication    Dispense:  20 tablet    Refill:  0     Follow-up: Return if symptoms worsen or fail to improve.  [name], M.D.

## 2015-06-29 ENCOUNTER — Encounter: Payer: Self-pay | Admitting: Family Medicine

## 2015-06-29 ENCOUNTER — Ambulatory Visit (INDEPENDENT_AMBULATORY_CARE_PROVIDER_SITE_OTHER): Payer: Medicare HMO | Admitting: Family Medicine

## 2015-06-29 VITALS — BP 123/81 | HR 74 | Temp 97.1°F | Ht 69.0 in | Wt 129.0 lb

## 2015-06-29 DIAGNOSIS — N400 Enlarged prostate without lower urinary tract symptoms: Secondary | ICD-10-CM | POA: Diagnosis not present

## 2015-06-29 DIAGNOSIS — Z1211 Encounter for screening for malignant neoplasm of colon: Secondary | ICD-10-CM | POA: Diagnosis not present

## 2015-06-29 DIAGNOSIS — I10 Essential (primary) hypertension: Secondary | ICD-10-CM

## 2015-06-29 LAB — URINALYSIS, COMPLETE
Bilirubin, UA: NEGATIVE
GLUCOSE, UA: NEGATIVE
KETONES UA: NEGATIVE
Leukocytes, UA: NEGATIVE
Nitrite, UA: NEGATIVE
Protein, UA: NEGATIVE
RBC, UA: NEGATIVE
SPEC GRAV UA: 1.01 (ref 1.005–1.030)
Urobilinogen, Ur: 0.2 mg/dL (ref 0.2–1.0)
pH, UA: 7 (ref 5.0–7.5)

## 2015-06-29 LAB — MICROSCOPIC EXAMINATION
Bacteria, UA: NONE SEEN
EPITHELIAL CELLS (NON RENAL): NONE SEEN /HPF (ref 0–10)
RBC MICROSCOPIC, UA: NONE SEEN /HPF (ref 0–?)
WBC, UA: NONE SEEN /hpf (ref 0–?)

## 2015-06-29 MED ORDER — MECLIZINE HCL 25 MG PO TABS: 25.0000 mg | ORAL_TABLET | Freq: Three times a day (TID) | ORAL | Status: DC | PRN

## 2015-06-29 MED ORDER — DIAZEPAM 2 MG PO TABS: 2.0000 mg | ORAL_TABLET | Freq: Two times a day (BID) | ORAL | Status: DC | PRN

## 2015-06-29 NOTE — Patient Instructions (Addendum)
Medicare Annual Wellness Visit  [facility] and the medical providers at [facility] strive to bring you the best medical care.  In doing so we not only want to address your current medical conditions and concerns but also to detect new conditions early and prevent illness, disease and health-related problems.    Medicare offers a yearly Wellness Visit which allows our clinical staff to assess your need for preventative services including immunizations, lifestyle education, counseling to decrease risk of preventable diseases and screening for fall risk and other medical concerns.    This visit is provided free of charge (no copay) for all Medicare recipients. The clinical pharmacists at [facility] have begun to conduct these Wellness Visits which will also include a thorough review of all your medications.    As you primary medical provider recommend that you make an appointment for your Annual Wellness Visit if you have not done so already this year.  You may set up this appointment before you leave today or you may call back [phone] and schedule an appointment.  Please make sure when you call that you mention that you are scheduling your Annual Wellness Visit with the clinical pharmacist so that the appointment may be made for the proper length of time.     Continue current medications. Continue good therapeutic lifestyle changes which include good diet and exercise. Fall precautions discussed with patient. If an FOBT was given today- please return it to our front desk. If you are over 68 years old - you may need Prevnar 19 or the adult Pneumonia vaccine.  **Flu shots are available--- please call and schedule a FLU-CLINIC appointment**  After your visit with [place] today you will receive a survey in the mail or online from [employer] regarding your care with [place]. Please take a moment to fill this out. Your feedback is very  important to [place] as you can help [place] better understand your patient needs as well as improve your experience and satisfaction. WE CARE ABOUT YOU!!!   Use Debrox as directed Be a little more liberal with the salt intake on your food and monitor blood pressures closely Continue to drink plenty of fluids and stay active and careful not to fall Add back vitamin D3 1000 daily Consider a probiotic to help normalize your bowel movements

## 2015-06-29 NOTE — Progress Notes (Signed)
Subjective:    Patient ID: Wesley Dolores., male    DOB: 04/19/1945, 70 y.o.   MRN: [id number]  HPI Pt here for follow up and management of chronic medical problems which includes hypertension. He is taking medications regularly.Patient is doing well overall. He is concerned about a skin lesion in his scalp and also a sore in his mouth. He is requesting refills on his diazepam and meclizine. The patient has lost a lot of weight on purpose and no longer has to take blood pressure medication. His weight today is down 2 pounds from the last visit. He has had his lab work done and we will review this during office visit. He refuses the flu vaccine and the Prevnar vaccine. The patient denies chest pain shortness of breath trouble swallowing heartburn indigestion nausea vomiting diarrhea or blood in the stool. He had his last colonoscopy in 2016. He says his bowel habits have changed somewhat since the colonoscopy but they have been stable since the colonoscopy. He's passing his water without problems there is no blood in the urine that he has seen and there is no burning or frequency. He does have a irritation in his mouth that he thinks was caused by brushing his teeth and he would like for me to look at that today. He has a lesion at his hairline on the right for head that he would also like for [place] to look at. He checks his blood pressures at home and they usually run in the [phone] range over 66-69.     Patient Active Problem List   Diagnosis Date Noted  . Vertigo 11/08/2012  . VBI (vertebrobasilar insufficiency) 11/08/2012  . HTN (hypertension), currently controlled without medication 11/08/2012  . Anxiety 11/08/2012   Outpatient Encounter Prescriptions as of 06/29/2015  Medication Sig  . aspirin EC 325 MG tablet Take 325 mg by mouth daily.  . cholecalciferol (VITAMIN D) 1000 UNITS tablet Take 1,000 Units by mouth daily.  . diazepam (VALIUM) 2 MG tablet Take 1 tablet (2 mg total) by mouth  every 12 (twelve) hours as needed.  . fish oil-omega-3 fatty acids 1000 MG capsule Take 1 g by mouth daily.   . meclizine (ANTIVERT) 25 MG tablet Take 1 tablet (25 mg total) by mouth 3 (three) times daily as needed for dizziness.  . [DISCONTINUED] amoxicillin-clavulanate (AUGMENTIN) 875-125 MG tablet Take 1 tablet by mouth 2 (two) times daily. Take all of this medication   No facility-administered encounter medications on file as of 06/29/2015.      Review of Systems  Constitutional: Negative.   HENT: Positive for mouth sores.   Eyes: Negative.   Respiratory: Negative.   Cardiovascular: Negative.   Gastrointestinal: Negative.   Endocrine: Negative.   Genitourinary: Negative.   Musculoskeletal: Negative.   Skin: Negative.        Lesion in scalp  Allergic/Immunologic: Negative.   Neurological: Negative.   Hematological: Negative.   Psychiatric/Behavioral: Negative.        Objective:   Physical Exam  Constitutional: He is oriented to person, place, and time. He appears well-developed and well-nourished. No distress.  HENT:  Head: Normocephalic and atraumatic.  Right Ear: External ear normal.  Left Ear: External ear normal.  Nose: Nose normal.  Mouth/Throat: Oropharynx is clear and moist. No oropharyngeal exudate.  There is a slight irritation on the gum for his upper denture  Eyes: Conjunctivae and EOM are normal. Pupils are equal, round, and reactive to light. Right eye exhibits  no discharge. Left eye exhibits no discharge. No scleral icterus.  Neck: Normal range of motion. Neck supple. No thyromegaly present.  No bruits or thyromegaly or adenopathy  Cardiovascular: Normal rate, regular rhythm, normal heart sounds and intact distal pulses.   No murmur heard. The heart was 60/m with a regular rate and rhythm  Pulmonary/Chest: Effort normal and breath sounds normal. No respiratory distress. He has no wheezes. He has no rales. He exhibits no tenderness.  Lungs were clear  anteriorly and posteriorly and no axillary adenopathy  Abdominal: Soft. Bowel sounds are normal. He exhibits no mass. There is no tenderness. There is no rebound and no guarding.  No liver or spleen enlargement no bruits and no masses and normal bowel sounds  Genitourinary: Rectum normal and penis normal.  The prostate was minimally enlarged but soft and there were no rectal masses. The external genitalia were within normal limits and no hernias were palpable.  Musculoskeletal: Normal range of motion. He exhibits no edema or tenderness.  Lymphadenopathy:    He has no cervical adenopathy.  Neurological: He is alert and oriented to person, place, and time. He has normal reflexes. No cranial nerve deficit.  Skin: Skin is warm and dry. Rash noted. No erythema. No pallor.  There was a small area of atopic dermatitis on his left buttock. There is also a seborrheic keratosis in the scalp line.  Psychiatric: He has a normal mood and affect. His behavior is normal. Judgment and thought content normal.  Nursing note and vitals reviewed.   BP 123/81 mmHg  Pulse 74  Temp(Src) 97.1 F (36.2 C) (Oral)  Ht 5\' 9"  (1.753 m)  Wt 129 lb (58.514 kg)  BMI 19.04 kg/m2       Assessment & Plan:  1. Essential hypertension -The patient's blood pressure is controlled with diet and sodium restriction without medication. His home readings have been good and actually lower than they are in the office.  2. BPH (benign prostatic hyperplasia) -He's had a recent PSA which is within normal limits and his prostate is slightly enlarged without masses. - Urinalysis, Complete  3. Special screening for malignant neoplasms, colon -He will return the FOBT but has not seen any blood in the stool. - Fecal occult blood, imunochemical; Future  Meds ordered this encounter  Medications  . meclizine (ANTIVERT) 25 MG tablet    Sig: Take 1 tablet (25 mg total) by mouth 3 (three) times daily as needed for dizziness.     Dispense:  90 tablet    Refill:  1  . diazepam (VALIUM) 2 MG tablet    Sig: Take 1 tablet (2 mg total) by mouth every 12 (twelve) hours as needed.    Dispense:  60 tablet    Refill:  1   Patient Instructions                       Medicare Annual Wellness Visit  [facility] and the medical providers at [facility] strive to bring you the best medical care.  In doing so we not only want to address your current medical conditions and concerns but also to detect new conditions early and prevent illness, disease and health-related problems.    Medicare offers a yearly Wellness Visit which allows our clinical staff to assess your need for preventative services including immunizations, lifestyle education, counseling to decrease risk of preventable diseases and screening for fall risk and other medical concerns.  This visit is provided free of charge (no copay) for all Medicare recipients. The clinical pharmacists at [facility] have begun to conduct these Wellness Visits which will also include a thorough review of all your medications.    As you primary medical provider recommend that you make an appointment for your Annual Wellness Visit if you have not done so already this year.  You may set up this appointment before you leave today or you may call back [phone]) and schedule an appointment.  Please make sure when you call that you mention that you are scheduling your Annual Wellness Visit with the clinical pharmacist so that the appointment may be made for the proper length of time.     Continue current medications. Continue good therapeutic lifestyle changes which include good diet and exercise. Fall precautions discussed with patient. If an FOBT was given today- please return it to our front desk. If you are over 75 years old - you may need Prevnar 81 or the adult Pneumonia vaccine.  **Flu shots are available--- please call and schedule a  FLU-CLINIC appointment**  After your visit with [place] today you will receive a survey in the mail or online from [employer] regarding your care with [place]. Please take a moment to fill this out. Your feedback is very important to [place] as you can help [place] better understand your patient needs as well as improve your experience and satisfaction. WE CARE ABOUT YOU!!!   Use Debrox as directed Be a little more liberal with the salt intake on your food and monitor blood pressures closely Continue to drink plenty of fluids and stay active and careful not to fall Add back vitamin D3 1000 daily Consider a probiotic to help normalize your bowel movements   Arrie Senate MD

## 2015-08-28 DIAGNOSIS — M170 Bilateral primary osteoarthritis of knee: Secondary | ICD-10-CM | POA: Diagnosis not present

## 2015-10-26 DIAGNOSIS — M170 Bilateral primary osteoarthritis of knee: Secondary | ICD-10-CM | POA: Diagnosis not present

## 2015-11-02 DIAGNOSIS — M170 Bilateral primary osteoarthritis of knee: Secondary | ICD-10-CM | POA: Diagnosis not present

## 2015-11-09 DIAGNOSIS — M170 Bilateral primary osteoarthritis of knee: Secondary | ICD-10-CM | POA: Diagnosis not present

## 2015-12-01 ENCOUNTER — Telehealth: Payer: Self-pay | Admitting: Family Medicine

## 2015-12-01 NOTE — Telephone Encounter (Signed)
Denied.

## 2015-12-23 ENCOUNTER — Ambulatory Visit (INDEPENDENT_AMBULATORY_CARE_PROVIDER_SITE_OTHER): Payer: Medicare HMO | Admitting: Family Medicine

## 2015-12-23 ENCOUNTER — Encounter: Payer: Self-pay | Admitting: Family Medicine

## 2015-12-23 VITALS — BP 134/87 | HR 71 | Temp 97.6°F | Ht 69.0 in | Wt 128.1 lb

## 2015-12-23 DIAGNOSIS — R5383 Other fatigue: Secondary | ICD-10-CM | POA: Diagnosis not present

## 2015-12-23 DIAGNOSIS — S60922A Unspecified superficial injury of left hand, initial encounter: Secondary | ICD-10-CM | POA: Diagnosis not present

## 2015-12-23 DIAGNOSIS — IMO0002 Reserved for concepts with insufficient information to code with codable children: Secondary | ICD-10-CM

## 2015-12-23 MED ORDER — DIAZEPAM 2 MG PO TABS: 2.0000 mg | ORAL_TABLET | Freq: Two times a day (BID) | ORAL | 1 refills | Status: DC | PRN

## 2015-12-23 NOTE — Progress Notes (Signed)
BP 134/87   Pulse 71   Temp 97.6 F (36.4 C) (Oral)   Ht 5\' 9"  (1.753 m)   Wt 128 lb 2 oz (58.1 kg)   BMI 18.92 kg/m    Subjective:    Patient ID: Wesley Fowler., male    DOB: 02/28/1946, 70 y.o.   MRN: [id number]  HPI: Majed Colgrove. is a 70 y.o. male presenting on 12/23/2015 for Laceration to left hand (on left hand, happened Saturday); Fatigue (patient has noticed recently he is bruising easily, has been more tired than usual the last 2 weeks); and Dizziness (patient has noticed when he reads he gets dizzy, he feels dizzy when he is inside a building; no positional dizziness)   HPI Fatigue and increased bruising Patient is concerned because he has been having increased fatigue and bruising and dizziness that has been worsening over the past few weeks. He denies any fevers or chills or shortness of breath or wheezing or chest pain. He is taking a full aspirin 325 mg daily. He said he had blood work done in February which tested for a lot of things but not his blood count or his thyroid. He denies any upper respiratory symptoms or urinary complaints or bowel complaints. His bruising that he has is mostly on his arms but he does have some on his legs. He also from feeling dizzy and had fallen and had sustained a small laceration on his left hand on the dorsum. The wound is mostly healed but he is just more concerned about the bruising. The fall and laceration happened a few days ago  Relevant past medical, surgical, family and social history reviewed and updated as indicated. Interim medical history since our last visit reviewed. Allergies and medications reviewed and updated.  Review of Systems  Constitutional: Positive for fatigue. Negative for chills and fever.  Eyes: Negative for discharge.  Respiratory: Negative for shortness of breath and wheezing.   Cardiovascular: Negative for chest pain and leg swelling.  Endocrine: Negative for cold intolerance, heat intolerance,  polydipsia and polyuria.  Musculoskeletal: Negative for back pain and gait problem.  Skin: Positive for color change (Bruising) and wound. Negative for rash.  Psychiatric/Behavioral: Negative for decreased concentration, dysphoric mood and sleep disturbance.  All other systems reviewed and are negative.   Per HPI unless specifically indicated above     Medication List       Accurate as of 12/23/15 12:38 PM. Always use your most recent med list.          aspirin EC 325 MG tablet Take 325 mg by mouth daily. Patient takes sporadically   cholecalciferol 1000 units tablet Commonly known as:  VITAMIN D Take 1,000 Units by mouth daily.   diazepam 2 MG tablet Commonly known as:  VALIUM Take 1 tablet (2 mg total) by mouth every 12 (twelve) hours as needed.   fish oil-omega-3 fatty acids 1000 MG capsule Take 1 g by mouth daily.   meclizine 25 MG tablet Commonly known as:  ANTIVERT Take 1 tablet (25 mg total) by mouth 3 (three) times daily as needed for dizziness.          Objective:    BP 134/87   Pulse 71   Temp 97.6 F (36.4 C) (Oral)   Ht 5\' 9"  (1.753 m)   Wt 128 lb 2 oz (58.1 kg)   BMI 18.92 kg/m   Wt Readings from Last 3 Encounters:  12/23/15 128 lb 2 oz (58.1 kg)  06/29/15 129 lb (58.5 kg)  06/19/15 131 lb (59.4 kg)    Physical Exam  Constitutional: He is oriented to person, place, and time. He appears well-developed and well-nourished. No distress.  Eyes: Conjunctivae are normal. Right eye exhibits no discharge. No scleral icterus.  Cardiovascular: Normal rate, regular rhythm, normal heart sounds and intact distal pulses.   No murmur heard. Pulmonary/Chest: Effort normal and breath sounds normal. No respiratory distress. He has no wheezes.  Musculoskeletal: Normal range of motion. He exhibits no edema.  Neurological: He is alert and oriented to person, place, and time. Coordination normal.  Skin: Skin is warm and dry. Bruising (Patient has multiple small  bruises on bilateral arms and a couple on his legs.) and lesion (Patient has a small 1.5 cm laceration that is mostly healed with a bruise surrounding it on the dorsum of his hands overlying the fifth ray) noted. No rash noted. He is not diaphoretic.  Psychiatric: He has a normal mood and affect. His behavior is normal.  Nursing note and vitals reviewed.     Assessment & Plan:   Problem List Items Addressed This Visit    None    Visit Diagnoses    Other fatigue    -  Primary   We'll test CBC and TSH, had the rest of his labs done in February   Relevant Orders   CBC with Differential/Platelet   TSH   Laceration       Small 1.5 cm laceration on left medial dorsum hand, mostly healed at this point, use triple antibiotic and keep covered for the next week. Change daily       Follow up plan: Return if symptoms worsen or fail to improve.  Counseling provided for all of the vaccine components Orders Placed This Encounter  Procedures  . CBC with Differential/Platelet  . TSH    Caryl Pina, MD [facility] 12/23/2015, 12:38 PM

## 2015-12-23 NOTE — Patient Instructions (Signed)
use triple antibiotic and keep covered for the next week. Change daily

## 2015-12-24 ENCOUNTER — Other Ambulatory Visit: Payer: Self-pay | Admitting: Family Medicine

## 2015-12-24 DIAGNOSIS — R5383 Other fatigue: Secondary | ICD-10-CM | POA: Diagnosis not present

## 2015-12-24 LAB — CBC WITH DIFFERENTIAL/PLATELET
BASOS ABS: 41 {cells}/uL (ref 0–200)
Basophils Relative: 1 %
EOS ABS: 41 {cells}/uL (ref 15–500)
Eosinophils Relative: 1 %
HCT: 37.8 % — ABNORMAL LOW (ref 38.5–50.0)
Hemoglobin: 13.3 g/dL (ref 13.0–17.0)
LYMPHS PCT: 24 %
Lymphs Abs: 984 cells/uL (ref 850–3900)
MCH: 32.1 pg (ref 27.0–33.0)
MCHC: 35.2 g/dL (ref 32.0–36.0)
MCV: 91.3 fL (ref 80.0–100.0)
MONOS PCT: 9 %
MPV: 9.4 fL (ref 7.5–12.5)
Monocytes Absolute: 369 cells/uL (ref 200–950)
NEUTROS PCT: 65 %
Neutro Abs: 2665 cells/uL (ref 1500–7800)
PLATELETS: 227 10*3/uL (ref 140–400)
RBC: 4.14 MIL/uL — ABNORMAL LOW (ref 4.20–5.80)
RDW: 12.2 % (ref 11.0–15.0)
WBC: 4.1 10*3/uL (ref 3.8–10.8)

## 2015-12-25 LAB — TSH: TSH: 0.98 mIU/L (ref 0.40–4.50)

## 2016-04-20 ENCOUNTER — Ambulatory Visit (INDEPENDENT_AMBULATORY_CARE_PROVIDER_SITE_OTHER): Payer: Medicare HMO | Admitting: Ophthalmology

## 2016-04-26 ENCOUNTER — Ambulatory Visit (INDEPENDENT_AMBULATORY_CARE_PROVIDER_SITE_OTHER): Payer: Medicare HMO | Admitting: Ophthalmology

## 2016-04-26 DIAGNOSIS — H43813 Vitreous degeneration, bilateral: Secondary | ICD-10-CM | POA: Diagnosis not present

## 2016-04-26 DIAGNOSIS — H338 Other retinal detachments: Secondary | ICD-10-CM

## 2016-04-26 DIAGNOSIS — H353112 Nonexudative age-related macular degeneration, right eye, intermediate dry stage: Secondary | ICD-10-CM | POA: Diagnosis not present

## 2016-04-26 DIAGNOSIS — H353121 Nonexudative age-related macular degeneration, left eye, early dry stage: Secondary | ICD-10-CM

## 2016-05-06 DIAGNOSIS — Z961 Presence of intraocular lens: Secondary | ICD-10-CM | POA: Diagnosis not present

## 2016-05-06 DIAGNOSIS — H40012 Open angle with borderline findings, low risk, left eye: Secondary | ICD-10-CM | POA: Diagnosis not present

## 2016-05-06 DIAGNOSIS — H401111 Primary open-angle glaucoma, right eye, mild stage: Secondary | ICD-10-CM | POA: Diagnosis not present

## 2016-05-31 DIAGNOSIS — L821 Other seborrheic keratosis: Secondary | ICD-10-CM | POA: Diagnosis not present

## 2016-05-31 DIAGNOSIS — D235 Other benign neoplasm of skin of trunk: Secondary | ICD-10-CM | POA: Diagnosis not present

## 2016-05-31 DIAGNOSIS — D1801 Hemangioma of skin and subcutaneous tissue: Secondary | ICD-10-CM | POA: Diagnosis not present

## 2016-05-31 DIAGNOSIS — L814 Other melanin hyperpigmentation: Secondary | ICD-10-CM | POA: Diagnosis not present

## 2016-06-03 ENCOUNTER — Ambulatory Visit (INDEPENDENT_AMBULATORY_CARE_PROVIDER_SITE_OTHER): Payer: Medicare HMO | Admitting: Pediatrics

## 2016-06-03 ENCOUNTER — Encounter: Payer: Self-pay | Admitting: Pediatrics

## 2016-06-03 VITALS — BP 126/82 | HR 80 | Temp 97.3°F | Ht 69.0 in | Wt 134.0 lb

## 2016-06-03 DIAGNOSIS — K219 Gastro-esophageal reflux disease without esophagitis: Secondary | ICD-10-CM | POA: Diagnosis not present

## 2016-06-03 MED ORDER — FAMOTIDINE 20 MG PO TABS: 20.0000 mg | ORAL_TABLET | Freq: Two times a day (BID) | ORAL | 2 refills | Status: DC

## 2016-06-03 NOTE — Progress Notes (Signed)
  Subjective:   Patient ID: Wesley Fowler., male    DOB: 07-25-1945, 72 y.o.   MRN: [id number] CC: epigastric pain HPI: Wesley Fowler. is a 71 y.o. male presenting for epigastric pain  Says he has been told he has a hiatal hernia Notices pain in his epiastric area off and on Sometimes worse after chopping wood or doing other exertion Spicy sausage tends to make the pain worse, eats regularly Used to be on PPI, not anymore Is nervous about side effects he has heard about No vomiting, no nausea Appetite has been fine Tends to eat large meals late in the day Stomach bothers him most at night Appetite is normal   Relevant past medical, surgical, family and social history reviewed. Allergies and medications reviewed and updated. History  Smoking Status  . Former Smoker  Smokeless Tobacco  . Never Used    Comment: quit 1982   ROS: Per HPI   Objective:    BP 126/82   Pulse 80   Temp 97.3 F (36.3 C) (Oral)   Ht 5\' 9"  (1.753 m)   Wt 134 lb (60.8 kg)   BMI 19.79 kg/m   Wt Readings from Last 3 Encounters:  06/03/16 134 lb (60.8 kg)  12/23/15 128 lb 2 oz (58.1 kg)  06/29/15 129 lb (58.5 kg)    Gen: NAD, alert, cooperative with exam, NCAT EYES: EOMI, no conjunctival injection, or no icterus CV: NRRR, normal S1/S2, no murmur Resp: CTABL, no wheezes, normal WOB Abd: +BS, soft, NTND. no guarding or organomegaly  Ext: No edema, warm Neuro: Alert and oriented MSK: non-tender along lower ribs  Assessment & Plan:  Diagnoses and all orders for this visit:  Gastroesophageal reflux disease, esophagitis presence not specified Discussed foods to avoid, eat small more frequent meals Trial of below -     famotidine (PEPCID) 20 MG tablet; Take 1 tablet (20 mg total) by mouth 2 (two) times daily.   Follow up plan: As scheduled in 1 mo with PCP Assunta Found, MD [place]

## 2016-06-09 ENCOUNTER — Telehealth: Payer: Self-pay | Admitting: Family Medicine

## 2016-06-09 NOTE — Telephone Encounter (Signed)
Orders placed and printed

## 2016-06-09 NOTE — Telephone Encounter (Signed)
Please review

## 2016-06-15 DIAGNOSIS — Z961 Presence of intraocular lens: Secondary | ICD-10-CM | POA: Diagnosis not present

## 2016-06-15 DIAGNOSIS — H40012 Open angle with borderline findings, low risk, left eye: Secondary | ICD-10-CM | POA: Diagnosis not present

## 2016-06-15 DIAGNOSIS — H401111 Primary open-angle glaucoma, right eye, mild stage: Secondary | ICD-10-CM | POA: Diagnosis not present

## 2016-06-21 ENCOUNTER — Other Ambulatory Visit: Payer: Self-pay | Admitting: Family Medicine

## 2016-06-21 DIAGNOSIS — I10 Essential (primary) hypertension: Secondary | ICD-10-CM | POA: Diagnosis not present

## 2016-06-21 DIAGNOSIS — D649 Anemia, unspecified: Secondary | ICD-10-CM | POA: Diagnosis not present

## 2016-06-21 DIAGNOSIS — Z0000 Encounter for general adult medical examination without abnormal findings: Secondary | ICD-10-CM | POA: Diagnosis not present

## 2016-06-21 DIAGNOSIS — N400 Enlarged prostate without lower urinary tract symptoms: Secondary | ICD-10-CM | POA: Diagnosis not present

## 2016-06-21 LAB — BASIC METABOLIC PANEL
BUN: 6 mg/dL — ABNORMAL LOW (ref 7–25)
CALCIUM: 9.2 mg/dL (ref 8.6–10.3)
CO2: 27 mmol/L (ref 20–31)
CREATININE: 0.82 mg/dL (ref 0.70–1.25)
Chloride: 98 mmol/L (ref 98–110)
GLUCOSE: 91 mg/dL (ref 70–99)
Potassium: 4.2 mmol/L (ref 3.5–5.3)
Sodium: 133 mmol/L — ABNORMAL LOW (ref 135–146)

## 2016-06-21 LAB — LIPID PANEL
CHOLESTEROL: 186 mg/dL (ref <200)
HDL: 83 mg/dL (ref >40)
LDL Cholesterol: 93 mg/dL (ref <100)
Total CHOL/HDL Ratio: 2.2 Ratio (ref <5.0)
Triglycerides: 52 mg/dL (ref <150)
VLDL: 10 mg/dL (ref <30)

## 2016-06-21 LAB — CBC WITH DIFFERENTIAL/PLATELET
BASOS ABS: 42 {cells}/uL (ref 0–200)
Basophils Relative: 1 %
EOS PCT: 2 %
Eosinophils Absolute: 84 cells/uL (ref 15–500)
HEMATOCRIT: 38.7 % (ref 38.5–50.0)
HEMOGLOBIN: 13.5 g/dL (ref 13.0–17.0)
LYMPHS ABS: 1092 {cells}/uL (ref 850–3900)
Lymphocytes Relative: 26 %
MCH: 31.3 pg (ref 27.0–33.0)
MCHC: 34.9 g/dL (ref 32.0–36.0)
MCV: 89.8 fL (ref 80.0–100.0)
MPV: 9.2 fL (ref 7.5–12.5)
Monocytes Absolute: 420 cells/uL (ref 200–950)
Monocytes Relative: 10 %
Neutro Abs: 2562 cells/uL (ref 1500–7800)
Neutrophils Relative %: 61 %
Platelets: 237 10*3/uL (ref 140–400)
RBC: 4.31 MIL/uL (ref 4.20–5.80)
RDW: 12.2 % (ref 11.0–15.0)
WBC: 4.2 10*3/uL (ref 3.8–10.8)

## 2016-06-21 LAB — HEPATIC FUNCTION PANEL
ALT: 14 U/L (ref 9–46)
AST: 18 U/L (ref 10–35)
Albumin: 4.4 g/dL (ref 3.6–5.1)
Alkaline Phosphatase: 81 U/L (ref 40–115)
BILIRUBIN DIRECT: 0.2 mg/dL (ref <=0.2)
BILIRUBIN INDIRECT: 0.7 mg/dL (ref 0.2–1.2)
Total Bilirubin: 0.9 mg/dL (ref 0.2–1.2)
Total Protein: 6.3 g/dL (ref 6.1–8.1)

## 2016-06-21 LAB — PSA: PSA: 0.9 ng/mL (ref <=4.0)

## 2016-06-22 LAB — URINALYSIS, ROUTINE W REFLEX MICROSCOPIC
BILIRUBIN URINE: NEGATIVE
GLUCOSE, UA: NEGATIVE
Hgb urine dipstick: NEGATIVE
Ketones, ur: NEGATIVE
LEUKOCYTES UA: NEGATIVE
Nitrite: NEGATIVE
Protein, ur: NEGATIVE
SPECIFIC GRAVITY, URINE: 1.004 (ref 1.001–1.035)
pH: 7 (ref 5.0–8.0)

## 2016-06-22 LAB — VITAMIN D 25 HYDROXY (VIT D DEFICIENCY, FRACTURES): VIT D 25 HYDROXY: 51 ng/mL (ref 30–100)

## 2016-07-06 ENCOUNTER — Encounter: Payer: Self-pay | Admitting: Family Medicine

## 2016-07-06 ENCOUNTER — Ambulatory Visit (INDEPENDENT_AMBULATORY_CARE_PROVIDER_SITE_OTHER): Payer: Medicare HMO | Admitting: Family Medicine

## 2016-07-06 ENCOUNTER — Ambulatory Visit (INDEPENDENT_AMBULATORY_CARE_PROVIDER_SITE_OTHER): Payer: Medicare HMO

## 2016-07-06 VITALS — BP 120/70 | HR 75 | Temp 97.3°F | Ht 69.0 in | Wt 133.0 lb

## 2016-07-06 DIAGNOSIS — I10 Essential (primary) hypertension: Secondary | ICD-10-CM

## 2016-07-06 DIAGNOSIS — I351 Nonrheumatic aortic (valve) insufficiency: Secondary | ICD-10-CM | POA: Diagnosis not present

## 2016-07-06 DIAGNOSIS — N400 Enlarged prostate without lower urinary tract symptoms: Secondary | ICD-10-CM | POA: Diagnosis not present

## 2016-07-06 DIAGNOSIS — K219 Gastro-esophageal reflux disease without esophagitis: Secondary | ICD-10-CM

## 2016-07-06 DIAGNOSIS — D692 Other nonthrombocytopenic purpura: Secondary | ICD-10-CM

## 2016-07-06 DIAGNOSIS — I340 Nonrheumatic mitral (valve) insufficiency: Secondary | ICD-10-CM | POA: Insufficient documentation

## 2016-07-06 MED ORDER — DIAZEPAM 2 MG PO TABS: 2.0000 mg | ORAL_TABLET | Freq: Two times a day (BID) | ORAL | 4 refills | Status: DC | PRN

## 2016-07-06 MED ORDER — FAMOTIDINE 20 MG PO TABS: 20.0000 mg | ORAL_TABLET | Freq: Two times a day (BID) | ORAL | 11 refills | Status: DC

## 2016-07-06 NOTE — Patient Instructions (Addendum)
Medicare Annual Wellness Visit  [facility] and the medical providers at [facility] strive to bring you the best medical care.  In doing so we not only want to address your current medical conditions and concerns but also to detect new conditions early and prevent illness, disease and health-related problems.    Medicare offers a yearly Wellness Visit which allows our clinical staff to assess your need for preventative services including immunizations, lifestyle education, counseling to decrease risk of preventable diseases and screening for fall risk and other medical concerns.    This visit is provided free of charge (no copay) for all Medicare recipients. The clinical pharmacists at [facility] have begun to conduct these Wellness Visits which will also include a thorough review of all your medications.    As you primary medical provider recommend that you make an appointment for your Annual Wellness Visit if you have not done so already this year.  You may set up this appointment before you leave today or you may call back ([phone]) and schedule an appointment.  Please make sure when you call that you mention that you are scheduling your Annual Wellness Visit with the clinical pharmacist so that the appointment may be made for the proper length of time.     Continue current medications. Continue good therapeutic lifestyle changes which include good diet and exercise. Fall precautions discussed with patient. If an FOBT was given today- please return it to our front desk. If you are over 47 years old - you may need Prevnar 4 or the adult Pneumonia vaccine.  **Flu shots are available--- please call and schedule a FLU-CLINIC appointment**  After your visit with [place] today you will receive a survey in the mail or online from [employer] regarding your care with [place]. Please take a moment to fill this out. Your feedback is very  important to [place] as you can help [place] better understand your patient needs as well as improve your experience and satisfaction. WE CARE ABOUT YOU!!!   Return FOBT Consider retrying Wellbutrin if anxiety persists Use Debrox for ear wax softening and left ear canal. Continue to follow-up with ophthalmology for glaucoma and make sure that he sends [place] a copy of your next visit with him.

## 2016-07-06 NOTE — Progress Notes (Addendum)
Subjective:    Patient ID: Wesley Fowler., male    DOB: 23-Jan-1946, 71 y.o.   MRN: [id number]  HPI Pt here for follow up and management of chronic medical problems which includes hypertension. He is taking medication regularly.The patient is doing well overall. He complains of periodic anxiety and constipation. His last colonoscopy was in May 2016. This was normal and no repeat was recommended for 10 years. The patient has had blood work done and this will be reviewed with him during the visit today. The vitamin D level urinalysis and PSA were all within normal limits. Although this serum sodium was slightly decreased the remainder of the electrolytes were good including the creatinine. The CBC was within normal limits in all cholesterol numbers checked with a traditional lipid panel were excellent. The patient denies any chest pain or shortness of breath. He does are has had some epigastric discomfort which was relieved with taking Pepcid. He takes a Pepcid on an as-needed basis before eating. He did have a recent colonoscopy and says that since he's had a colonoscopy you he has had more constipation. He is taking probiotics and this is helped some. He says he drinks plenty of water. Eyes any blood in the stool black tarry bowel movements nausea vomiting or blood in the stool. He's passing his water without problems. 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 He has been diagnosed with glaucoma and one of his eyes and goes back on a regular basis to see the ophthalmologist. His vital signs today are stable.    Patient Active Problem List   Diagnosis Date Noted  . Vertigo 11/08/2012  . VBI (vertebrobasilar insufficiency) 11/08/2012  . HTN (hypertension), currently controlled without medication 11/08/2012  . Anxiety 11/08/2012   Outpatient Encounter Prescriptions as of 07/06/2016  Medication Sig  . aspirin EC 325 MG tablet Take 81 mg by mouth daily. Patient takes sporadically    . cholecalciferol (VITAMIN D) 1000 UNITS tablet Take 1,000 Units by mouth daily.  . diazepam (VALIUM) 2 MG tablet Take 1 tablet (2 mg total) by mouth every 12 (twelve) hours as needed.  . famotidine (PEPCID) 20 MG tablet Take 1 tablet (20 mg total) by mouth 2 (two) times daily.  . fish oil-omega-3 fatty acids 1000 MG capsule Take 1 g by mouth daily.   Marland Kitchen latanoprost (XALATAN) 0.005 % ophthalmic solution   . meclizine (ANTIVERT) 25 MG tablet Take 1 tablet (25 mg total) by mouth 3 (three) times daily as needed for dizziness.   No facility-administered encounter medications on file as of 07/06/2016.       Review of Systems  Constitutional: Negative.   HENT: Negative.   Eyes: Negative.   Respiratory: Negative.   Cardiovascular: Negative.   Gastrointestinal: Negative.   Endocrine: Negative.   Genitourinary: Negative.   Musculoskeletal: Negative.   Skin: Negative.   Allergic/Immunologic: Negative.   Neurological: Negative.   Hematological: Negative.   Psychiatric/Behavioral: The patient is nervous/anxious.        Objective:   Physical Exam  Constitutional: He is oriented to person, place, and time. He appears well-developed and well-nourished. No distress.  Alert but thin and small framed and weight is stable.  HENT:  Head: Normocephalic and atraumatic.  Right Ear: External ear normal.  Nose: Nose normal.  Mouth/Throat: Oropharynx is clear and moist. No oropharyngeal exudate.  Ear cerumen left ear canal  Eyes: Conjunctivae and EOM are normal. Pupils are equal, round,  and reactive to light. Right eye exhibits no discharge. Left eye exhibits no discharge. No scleral icterus.  He will continue to follow-up every 3-4 months with his ophthalmologist Dr. Katy Fitch because of glaucoma.  Neck: Normal range of motion. Neck supple. No thyromegaly present.  No bruits thyromegaly or anterior cervical adenopathy  Cardiovascular: Normal rate, regular rhythm, normal heart sounds and intact distal  pulses.   No murmur heard. The heart has a regular rate and rhythm at 48/N with a click noted on auscultation.  Pulmonary/Chest: Effort normal and breath sounds normal. No respiratory distress. He has no wheezes. He has no rales. He exhibits no tenderness.  Axillary regions are negative for adenopathy  Abdominal: Soft. Bowel sounds are normal. He exhibits no mass. There is no tenderness. There is no rebound and no guarding.  Genitourinary: Rectum normal and penis normal.  Genitourinary Comments: The prostate is minimally enlarged. There were no inguinal hernias palpable. The external genitalia were within normal limits.  Musculoskeletal: Normal range of motion. He exhibits no edema.  Lymphadenopathy:    He has no cervical adenopathy.  Neurological: He is alert and oriented to person, place, and time. He has normal reflexes. No cranial nerve deficit.  Skin: Skin is warm and dry. No rash noted.  The patient does have some scattered bruises on his arm and hands.  Psychiatric: He has a normal mood and affect. His behavior is normal. Judgment and thought content normal.  Nursing note and vitals reviewed.  BP 120/70 (BP Location: Left Arm)   Pulse 75   Temp 97.3 F (36.3 C) (Oral)   Ht 5\' 9"  (1.753 m)   Wt 133 lb (60.3 kg)   BMI 19.64 kg/m   EKG with results pending Chest x-ray with results pending      Assessment & Plan:  1. Gastroesophageal reflux disease, esophagitis presence not specified -Watch caffeine intake as much as possible - famotidine (PEPCID) 20 MG tablet; Take 1 tablet (20 mg total) by mouth 2 (two) times daily.  Dispense: 60 tablet; Refill: 11  2. Essential hypertension -Continue to monitor blood pressures at home - DG Chest 2 View; Future - EKG 12-Lead  3. Non-rheumatic mitral regurgitation -The patient's last echocardiogram was in 2014. He is having no symptoms with the mitral valve prolapse.  4. Benign prostatic hyperplasia without lower urinary tract  symptoms -The prostate is small and smooth and his PSA was normal.  5. Senile purpura ([place])  6.Aortic regurgitation -Last echo was 2014. The patient was given a copy of this report for review.   Meds ordered this encounter  Medications  . latanoprost (XALATAN) 0.005 % ophthalmic solution  . diazepam (VALIUM) 2 MG tablet    Sig: Take 1 tablet (2 mg total) by mouth every 12 (twelve) hours as needed.    Dispense:  60 tablet    Refill:  4  . famotidine (PEPCID) 20 MG tablet    Sig: Take 1 tablet (20 mg total) by mouth 2 (two) times daily.    Dispense:  60 tablet    Refill:  11   Patient Instructions                       Medicare Annual Wellness Visit  [facility] and the medical providers at [facility] strive to bring you the best medical care.  In doing so we not only want to address your current medical conditions and concerns but also to detect new  conditions early and prevent illness, disease and health-related problems.    Medicare offers a yearly Wellness Visit which allows our clinical staff to assess your need for preventative services including immunizations, lifestyle education, counseling to decrease risk of preventable diseases and screening for fall risk and other medical concerns.    This visit is provided free of charge (no copay) for all Medicare recipients. The clinical pharmacists at [facility] have begun to conduct these Wellness Visits which will also include a thorough review of all your medications.    As you primary medical provider recommend that you make an appointment for your Annual Wellness Visit if you have not done so already this year.  You may set up this appointment before you leave today or you may call back ([phone]) and schedule an appointment.  Please make sure when you call that you mention that you are scheduling your Annual Wellness Visit with the clinical pharmacist so that the appointment may be made  for the proper length of time.     Continue current medications. Continue good therapeutic lifestyle changes which include good diet and exercise. Fall precautions discussed with patient. If an FOBT was given today- please return it to our front desk. If you are over 91 years old - you may need Prevnar 98 or the adult Pneumonia vaccine.  **Flu shots are available--- please call and schedule a FLU-CLINIC appointment**  After your visit with [place] today you will receive a survey in the mail or online from [employer] regarding your care with [place]. Please take a moment to fill this out. Your feedback is very important to [place] as you can help [place] better understand your patient needs as well as improve your experience and satisfaction. WE CARE ABOUT YOU!!!   Return FOBT Consider retrying Wellbutrin if anxiety persists Use Debrox for ear wax softening and left ear canal.    Arrie Senate MD

## 2016-09-07 DIAGNOSIS — M170 Bilateral primary osteoarthritis of knee: Secondary | ICD-10-CM | POA: Diagnosis not present

## 2016-09-14 DIAGNOSIS — M170 Bilateral primary osteoarthritis of knee: Secondary | ICD-10-CM | POA: Diagnosis not present

## 2016-09-21 DIAGNOSIS — M170 Bilateral primary osteoarthritis of knee: Secondary | ICD-10-CM | POA: Diagnosis not present

## 2016-10-18 DIAGNOSIS — H401111 Primary open-angle glaucoma, right eye, mild stage: Secondary | ICD-10-CM | POA: Diagnosis not present

## 2016-10-18 DIAGNOSIS — Z961 Presence of intraocular lens: Secondary | ICD-10-CM | POA: Diagnosis not present

## 2016-10-18 DIAGNOSIS — H40012 Open angle with borderline findings, low risk, left eye: Secondary | ICD-10-CM | POA: Diagnosis not present

## 2017-01-24 DIAGNOSIS — M25561 Pain in right knee: Secondary | ICD-10-CM | POA: Diagnosis not present

## 2017-01-24 DIAGNOSIS — M1711 Unilateral primary osteoarthritis, right knee: Secondary | ICD-10-CM | POA: Diagnosis not present

## 2017-01-30 ENCOUNTER — Other Ambulatory Visit (HOSPITAL_COMMUNITY): Payer: Self-pay | Admitting: Orthopedic Surgery

## 2017-01-30 ENCOUNTER — Ambulatory Visit (HOSPITAL_COMMUNITY)
Admission: RE | Admit: 2017-01-30 | Discharge: 2017-01-30 | Disposition: A | Discharge status: Home / Self Care | Payer: Medicare HMO | Source: Ambulatory Visit | Attending: Physician Assistant | Admitting: Physician Assistant

## 2017-01-30 DIAGNOSIS — I878 Other specified disorders of veins: Secondary | ICD-10-CM | POA: Insufficient documentation

## 2017-01-30 DIAGNOSIS — R9389 Abnormal findings on diagnostic imaging of other specified body structures: Secondary | ICD-10-CM | POA: Diagnosis not present

## 2017-01-30 DIAGNOSIS — M1711 Unilateral primary osteoarthritis, right knee: Secondary | ICD-10-CM | POA: Diagnosis not present

## 2017-01-30 DIAGNOSIS — M7989 Other specified soft tissue disorders: Secondary | ICD-10-CM

## 2017-01-30 DIAGNOSIS — M79604 Pain in right leg: Secondary | ICD-10-CM | POA: Diagnosis not present

## 2017-01-30 DIAGNOSIS — M25561 Pain in right knee: Secondary | ICD-10-CM | POA: Diagnosis not present

## 2017-01-30 NOTE — Progress Notes (Signed)
Right lower extremity venous duplex has been completed. Preliminary results can be found in chart review -> CV Proc Results were faxed to Dr. Alvan Dame.  01/30/17 9:00 AM Wesley Fowler RVT

## 2017-02-22 DIAGNOSIS — Z961 Presence of intraocular lens: Secondary | ICD-10-CM | POA: Diagnosis not present

## 2017-02-22 DIAGNOSIS — H401111 Primary open-angle glaucoma, right eye, mild stage: Secondary | ICD-10-CM | POA: Diagnosis not present

## 2017-02-22 DIAGNOSIS — H40012 Open angle with borderline findings, low risk, left eye: Secondary | ICD-10-CM | POA: Diagnosis not present

## 2017-02-27 ENCOUNTER — Encounter: Payer: Self-pay | Admitting: Family Medicine

## 2017-02-27 ENCOUNTER — Ambulatory Visit (INDEPENDENT_AMBULATORY_CARE_PROVIDER_SITE_OTHER): Payer: Medicare HMO | Admitting: Family Medicine

## 2017-02-27 VITALS — BP 139/90 | HR 75 | Temp 97.0°F | Ht 69.0 in | Wt 129.0 lb

## 2017-02-27 DIAGNOSIS — L989 Disorder of the skin and subcutaneous tissue, unspecified: Secondary | ICD-10-CM

## 2017-02-27 DIAGNOSIS — K5900 Constipation, unspecified: Secondary | ICD-10-CM | POA: Diagnosis not present

## 2017-02-27 MED ORDER — CEPHALEXIN 500 MG PO CAPS: 500.0000 mg | ORAL_CAPSULE | Freq: Three times a day (TID) | ORAL | 0 refills | Status: DC

## 2017-02-27 NOTE — Patient Instructions (Signed)
Great to meet you!   Consider Metamucil 1 scoop once daily for about 1 week, if you have not seen an improvement in your bowel movements after that use 1 scoop twice daily.  For your hand lesion I have given you Keflex, an antibiotic.  I do not believe it is true cellulitis at this time, however with redness and throbbing at night I understand your concerns and have went ahead and started you on antibiotic due to this. Call back with any concerns.

## 2017-02-27 NOTE — Progress Notes (Signed)
   HPI  Patient presents today here with left hand pain and constipation.  Left hand pain Patient explains that he was throwing a branch over a fence last week and hit his left lateral hand on a metal fence post.  He had pain and swelling at the time that was understandable and then improvement over the next 2 or 3 days.  However he has developed redness and throbbing pain that has been persistent and worse at night for the last 3 days. He is worried about cellulitis due to a.  No fever, chills, sweats.  Patient states for 3-4 years he has had difficulty with his bowels after colonoscopy.  Previous to this he used the restroom once daily with one easy stool. States that he is now going about once every 3 days and has some lower abdominal discomfort at times. He is tried laxative occasionally when needed.  He wonders if there is anything to use on a daily basis  PMH: Smoking status noted ROS: Per HPI  Objective: BP 139/90   Pulse 75   Temp (!) 97 F (36.1 C) (Oral)   Ht 5\' 9"  (1.753 m)   Wt 129 lb (58.5 kg)   BMI 19.05 kg/m  Gen: NAD, alert, cooperative with exam HEENT: NCAT, EOMI, PERRL CV: RRR, good S1/S2, no murmur Resp: CTABL, no wheezes, non-labored Ext: No edema, warm Neuro: Alert and oriented, No gross deficits Skin:  L dorsal hand with 2 heme crusted lesions, one with a scant amount of fresh blood centrally located. No induration, however he does have tenderness around the area.  No surrounding erythema or warmth.  Assessment and plan:  # Constipation Discussed conservative care, trial of metamucil  # Skin lesion L hand, Possible developing cellulitis- keflex.  RTC with any concerns    Meds ordered this encounter  Medications  . cephALEXin (KEFLEX) 500 MG capsule    Sig: Take 1 capsule (500 mg total) by mouth 3 (three) times daily.    Dispense:  21 capsule    Refill:  0    Laroy Apple, MD [facility] 02/27/2017, 9:54  AM

## 2017-04-26 ENCOUNTER — Ambulatory Visit (INDEPENDENT_AMBULATORY_CARE_PROVIDER_SITE_OTHER): Payer: Medicare HMO | Admitting: Ophthalmology

## 2017-04-26 DIAGNOSIS — H353121 Nonexudative age-related macular degeneration, left eye, early dry stage: Secondary | ICD-10-CM | POA: Diagnosis not present

## 2017-04-26 DIAGNOSIS — H353112 Nonexudative age-related macular degeneration, right eye, intermediate dry stage: Secondary | ICD-10-CM

## 2017-04-26 DIAGNOSIS — H338 Other retinal detachments: Secondary | ICD-10-CM

## 2017-04-26 DIAGNOSIS — H43813 Vitreous degeneration, bilateral: Secondary | ICD-10-CM | POA: Diagnosis not present

## 2017-05-09 ENCOUNTER — Ambulatory Visit (INDEPENDENT_AMBULATORY_CARE_PROVIDER_SITE_OTHER): Payer: Medicare HMO

## 2017-05-09 ENCOUNTER — Ambulatory Visit (INDEPENDENT_AMBULATORY_CARE_PROVIDER_SITE_OTHER): Payer: Medicare HMO | Admitting: Family Medicine

## 2017-05-09 ENCOUNTER — Encounter: Payer: Self-pay | Admitting: Family Medicine

## 2017-05-09 VITALS — BP 124/84 | HR 83 | Temp 99.0°F | Ht 68.0 in | Wt 134.0 lb

## 2017-05-09 DIAGNOSIS — S6992XA Unspecified injury of left wrist, hand and finger(s), initial encounter: Secondary | ICD-10-CM

## 2017-05-09 DIAGNOSIS — S61213A Laceration without foreign body of left middle finger without damage to nail, initial encounter: Secondary | ICD-10-CM

## 2017-05-09 DIAGNOSIS — Z23 Encounter for immunization: Secondary | ICD-10-CM | POA: Diagnosis not present

## 2017-05-09 DIAGNOSIS — S6722XA Crushing injury of left hand, initial encounter: Secondary | ICD-10-CM | POA: Diagnosis not present

## 2017-05-09 MED ORDER — CEPHALEXIN 500 MG PO CAPS: 500.0000 mg | ORAL_CAPSULE | Freq: Three times a day (TID) | ORAL | 0 refills | Status: DC

## 2017-05-09 NOTE — Progress Notes (Signed)
   HPI  Patient presents today here with a laceration.  Patient explains he was moving some logs around when 1 rolled her causing a large laceration.  Reports complete sensation and function of the finger with no concerns otherwise.   PMH: Smoking status noted ROS: Per HPI  Objective: BP 124/84   Pulse 83   Temp 99 F (37.2 C) (Oral)   Ht 5\' 8"  (1.727 m)   Wt 134 lb (60.8 kg)   BMI 20.37 kg/m  Gen: NAD, alert, cooperative with exam HEENT: NCAT CV: RRR, good S1/S2, no murmur Resp: CTABL, no wheezes, non-labored Abd: SNTND, BS present, no guarding or organomegaly Ext: No edema, warm Neuro: Alert and oriented, No gross deficits  Skin L middle finger with 3.4 cm C shaped lac with exposed intact tendon underneath  MSK- Full strength with flexion and extension of L middle finger  Sensation intact  Laceration repair Area was cleaned copiously with normal saline and using 2-1/2 mL of 1% lidocaine without epinephrine I performed a ring block with good analgesia. Using 3-0 Prolene 6 interrupted sutures were placed with hemostasis. Clean dry bandage was placed, no complications  Assessment and plan:  #Right hand injury left, laceration left middle finger Repaired as above, no bony abnormality No signs of foreign body Neurovascularly intact, tendon also intact with full strength    Orders Placed This Encounter  Procedures  . DG Hand Complete Left    Standing Status:   Future    Number of Occurrences:   1    Standing Expiration Date:   07/08/2018    Order Specific Question:   Reason for Exam (SYMPTOM  OR DIAGNOSIS REQUIRED)    Answer:   hand injury    Order Specific Question:   Preferred imaging location?    Answer:   Internal    Order Specific Question:   Radiology Contrast Protocol - do NOT remove file path    Answer:   [email or web address]  . Td : Tetanus/diphtheria >7yo Preservative  free    Meds ordered this encounter    Medications  . cephALEXin (KEFLEX) 500 MG capsule    Sig: Take 1 capsule (500 mg total) by mouth 3 (three) times daily.    Dispense:  21 capsule    Refill:  [place], MD [facility] 05/09/2017, 12:56 PM

## 2017-05-18 ENCOUNTER — Encounter: Payer: Self-pay | Admitting: Family Medicine

## 2017-05-18 ENCOUNTER — Ambulatory Visit (INDEPENDENT_AMBULATORY_CARE_PROVIDER_SITE_OTHER): Payer: Medicare HMO | Admitting: Family Medicine

## 2017-05-18 VITALS — BP 133/74 | HR 69 | Temp 97.2°F | Ht 68.0 in | Wt 132.1 lb

## 2017-05-18 DIAGNOSIS — Z4802 Encounter for removal of sutures: Secondary | ICD-10-CM

## 2017-05-18 NOTE — Progress Notes (Signed)
   HPI  Patient presents today here for suture removal  Patient was seen 7 days ago and sutures were placed in his left middle finger after a log fell on it.  He feels that is healing well and he completed an entire course of Keflex.   PMH: Smoking status noted ROS: Per HPI  Objective: BP 133/74   Pulse 69   Temp (!) 97.2 F (36.2 C) (Oral)   Ht 5\' 8"  (1.727 m)   Wt 132 lb 2 oz (59.9 kg)   BMI 20.09 kg/m  Gen: NAD, alert, cooperative with exam HEENT: NCAT, EOMI, PERRL Ext: No edema, warm Neuro: Alert and oriented, No gross deficits  Skin 6 Prolene sutures in place on left middle finger, the edges of the wound have withdrawn some from initial alignment, no concerning erythema, warmth, or drainage.  Area cleaned with alcohol prep pad and 6 sutures removed without event. Sterile dressing placed  Assessment and plan:  # Suture removal- removed as above, no signs of infection.    Laroy Apple, MD [facility] 05/18/2017, 8:28 AM

## 2017-05-22 ENCOUNTER — Encounter: Payer: Self-pay | Admitting: Family Medicine

## 2017-05-22 ENCOUNTER — Ambulatory Visit (INDEPENDENT_AMBULATORY_CARE_PROVIDER_SITE_OTHER): Payer: Medicare HMO | Admitting: Family Medicine

## 2017-05-22 VITALS — BP 131/79 | HR 76 | Temp 97.3°F | Ht 68.0 in | Wt 131.0 lb

## 2017-05-22 DIAGNOSIS — L089 Local infection of the skin and subcutaneous tissue, unspecified: Secondary | ICD-10-CM

## 2017-05-22 DIAGNOSIS — T8130XA Disruption of wound, unspecified, initial encounter: Secondary | ICD-10-CM

## 2017-05-22 MED ORDER — ONDANSETRON 4 MG PO TBDP
4.0000 mg | ORAL_TABLET | Freq: Three times a day (TID) | ORAL | 0 refills | Status: DC | PRN
Start: 2017-05-22 — End: 2018-10-01

## 2017-05-22 MED ORDER — DOXYCYCLINE HYCLATE 100 MG PO TABS: 100.0000 mg | ORAL_TABLET | Freq: Two times a day (BID) | ORAL | 0 refills | Status: DC

## 2017-05-22 NOTE — Progress Notes (Signed)
Subjective: CC: ?infected finger PCP: [name], MD PPJ:KDTO Jansen Sciuto. is a 72 y.o. male presenting to clinic today for:  1. ?Infected finger Patient status post removal of sutures in his left third finger on 05/18/2017.  There is no evidence of infection at that time.  He is status post completion of 7 days of Keflex.  Today he notes that on Saturday, he had swelling, pain, redness and draining.  His wife had some leftover Keflex which she used x3 doses.  He notes that the wound seems to have broken open.  He notes every time he bends his finger it seems to open.  Denies fevers at home.  He does have some discomfort but no joint swelling.   ROS: Per HPI  Allergies  Allergen Reactions  . Percocet [Oxycodone-Acetaminophen] Nausea And Vomiting  . Sulfa Antibiotics Nausea Only   Past Medical History:  Diagnosis Date  . Anxiety   . Arthritis   . Glaucoma   . History of high blood pressure     Current Outpatient Medications:  .  diazepam (VALIUM) 2 MG tablet, Take 1 tablet (2 mg total) by mouth every 12 (twelve) hours as needed., Disp: 60 tablet, Rfl: 4 .  famotidine (PEPCID) 20 MG tablet, Take 1 tablet (20 mg total) by mouth 2 (two) times daily., Disp: 60 tablet, Rfl: 11 .  aspirin EC 325 MG tablet, Take 81 mg by mouth daily. Patient takes sporadically, Disp: , Rfl:  .  cholecalciferol (VITAMIN D) 1000 UNITS tablet, Take 1,000 Units by mouth daily., Disp: , Rfl:  .  fish oil-omega-3 fatty acids 1000 MG capsule, Take 1 g by mouth daily. , Disp: , Rfl:  .  latanoprost (XALATAN) 0.005 % ophthalmic solution, , Disp: , Rfl:  .  meclizine (ANTIVERT) 25 MG tablet, Take 1 tablet (25 mg total) by mouth 3 (three) times daily as needed for dizziness. (Patient not taking: Reported on 05/22/2017), Disp: 90 tablet, Rfl: 1 Social History   Socioeconomic History  . Marital status: Married    Spouse name: Not on file  . Number of children: Not on file  . Years of education: Not on file  .  Highest education level: Not on file  Social Needs  . Financial resource strain: Not on file  . Food insecurity - worry: Not on file  . Food insecurity - inability: Not on file  . Transportation needs - medical: Not on file  . Transportation needs - non-medical: Not on file  Occupational History  . Not on file  Tobacco Use  . Smoking status: Former Research scientist (life sciences)  . Smokeless tobacco: Never Used  . Tobacco comment: quit 1982  Substance and Sexual Activity  . Alcohol use: No  . Drug use: No  . Sexual activity: No  Other Topics Concern  . Not on file  Social History Narrative  . Not on file   Family History  Problem Relation Age of Onset  . Lung cancer Maternal Grandfather     Objective: Office vital signs reviewed. BP 131/79   Pulse 76   Temp (!) 97.3 F (36.3 C) (Oral)   Ht 5\' 8"  (1.727 m)   Wt 131 lb (59.4 kg)   BMI 19.92 kg/m   Physical Examination:  General: Awake, alert, nontoxic appearing. No acute distress Extremities: warm, well perfused, No edema, cyanosis or clubbing; +2 pulses bilaterally; good capillary refill MSK: No joint swelling appreciated.  No erythema.  He has slight decreased active range of  motion in the middle finger secondary to wound. Skin: Wound dehiscence appreciated over the dorsum of the left middle finger at the PIP joint.  No significant drainage or erythema. Neuro: Light touch sensation grossly intact.  Assessment/ Plan: 72 y.o. male   1. Dehiscence of wound There is no gross evidence of infection on today's exam.  Patient is afebrile and well-appearing.  I suspect this is more dehiscence than anything.  Wound was cleaned and Steri-Strips were applied to affected area.  This was wrapped as well.  I did recommend the patient avoid bending middle digit so as to allow for appropriate wound healing.  Because of his description of infective symptoms over the weekend, I have placed him on a week of doxycycline 100 mg p.o. twice daily.  Home care  instructions for cleaning the wound reviewed with the patient.  He voiced good understanding and will follow-up for wound recheck on Friday.  If he continues to have difficulty with the wound dehiscing, could consider sending to wound care versus surgery for further evaluation and management.  However, I did discuss with him I think this can be managed on an outpatient setting if he can actually immobilize the finger.  2. Soft tissue infection See above.  Meds ordered this encounter  Medications  . doxycycline (VIBRA-TABS) 100 MG tablet    Sig: Take 1 tablet (100 mg total) by mouth 2 (two) times daily.    Dispense:  20 tablet    Refill:  0  . ondansetron (ZOFRAN ODT) 4 MG disintegrating tablet    Sig: Take 1 tablet (4 mg total) by mouth every 8 (eight) hours as needed for nausea or vomiting.    Dispense:  20 tablet    Refill:  [place], DO [place] [phone]

## 2017-05-22 NOTE — Patient Instructions (Signed)
Based on what you are describing, I am concerned about infection.  It actually does not appear grossly infected on your exam today but I will cover you with antibiotics.  Your wound is dehiscing.  This happens when you have a wound over high tension area.   Wound Dehiscence Wound dehiscence is when a cut from surgery (an incision) opens up and does not heal like it should. This problem usually happens 7-10 days after surgery. You may have bleeding from the cut. You may also have pain or a fever. This condition should be treated early. Follow these instructions at home: Medicines  Take over-the-counter and prescription medicines only as told by your doctor.  If you were prescribed an antibiotic medicine, take it as told by your doctor. Do not stop taking it even if you start to feel better.  Use medicines that help to stop itching as told by your doctor. The wound may feel itchy as it heals. Wound care  Follow instructions from your doctor about how to take care of your wound. Make sure you: ? Wash your hands with soap and water before you change your bandage (dressing) or wash the wound area. If you cannot use soap and water, use hand sanitizer. ? Wash your wound with mild soap and water 2 times a day, or as told. Rinse off the soap. Pat the area dry with a clean towel. Do not rub the wound. ? Change bandages as told by your doctor.  Do not pick or scratch at the wound.  Check your wound area every day for signs of infection. Check for: ? More redness, swelling, or pain. ? More fluid or blood. ? Warmth. ? Pus or a bad smell. Activity  Avoid exercises that make you sweat or could stretch your wound.  Do not lift anything that is heavier than 10 lb (4.5 kg) until the wound is healed or until your doctor says that it is safe. General instructions  Do not take baths, swim, or use a hot tub until your doctor approves. You may take showers.  Keep all follow-up visits as told by your  doctor. This is important. Contact a doctor if:  Your wound does not seem to be healing right.  You have a fever. Get help right away if:  You have more redness, swelling, or pain around your wound.  You have more fluid coming from your wound.  Your wound feels warm to the touch.  You have pus or a bad smell coming from your wound.  More of the wound breaks open.  You have red streaks spreading from your wound.  You have a lot of bleeding from your wound. Summary  Wound dehiscence is when a cut from surgery (an incision) opens up and does not heal like it should.  Follow instructions from your doctor about how to take care of your wound.  Check your wound area every day for signs of infection. These signs can be redness, swelling, pain, fluid, blood, warmth, pus, or a bad smell.  If you were prescribed an antibiotic medicine, take it as told by your doctor. Do not stop taking it even if you start to feel better.  Contact a doctor if your wound does not seem to be healing right. This information is not intended to replace advice given to you by your health care provider. Make sure you discuss any questions you have with your health care provider. Document Released: 03/09/2009 Document Revised: 02/24/2016 Document Reviewed: 02/24/2016  Elsevier Interactive Patient Education  2017 [employer]  

## 2017-05-23 ENCOUNTER — Telehealth: Payer: Self-pay | Admitting: Family Medicine

## 2017-05-23 NOTE — Telephone Encounter (Signed)
Pt advised there is a new shingles vaccine that is a 2 shot series but pt states he will wait until he sees Dr Laurance Flatten at his next visit and discuss with him.

## 2017-05-24 ENCOUNTER — Encounter: Payer: Self-pay | Admitting: Family Medicine

## 2017-05-24 ENCOUNTER — Ambulatory Visit (INDEPENDENT_AMBULATORY_CARE_PROVIDER_SITE_OTHER): Payer: Medicare HMO | Admitting: Family Medicine

## 2017-05-24 VITALS — BP 139/85 | Temp 96.9°F | Ht 67.0 in | Wt 134.0 lb

## 2017-05-24 DIAGNOSIS — S61203D Unspecified open wound of left middle finger without damage to nail, subsequent encounter: Secondary | ICD-10-CM | POA: Diagnosis not present

## 2017-05-24 DIAGNOSIS — T8130XD Disruption of wound, unspecified, subsequent encounter: Secondary | ICD-10-CM

## 2017-05-24 NOTE — Progress Notes (Signed)
Subjective: CC: f/u dehisced wound PCP: [name], MD VQM:GQQP Wesley Fowler. is a 72 y.o. male presenting to clinic today for:  1. Wound Patient here for wound recheck.  He was seen 2 days ago for concern of infection.  Wound has been doing well since last visit.  It seems to be healing.  He has been keeping it in extension as much as possible.  Notes minimal drainage.  Denies pain, swelling, erythema, joint issues.  He has been taking doxycycline 100 mg p.o. twice daily with out any difficulties.   ROS: Per HPI  Allergies  Allergen Reactions  . Percocet [Oxycodone-Acetaminophen] Nausea And Vomiting  . Sulfa Antibiotics Nausea Only   Past Medical History:  Diagnosis Date  . Anxiety   . Arthritis   . Glaucoma   . History of high blood pressure     Current Outpatient Medications:  .  aspirin EC 325 MG tablet, Take 81 mg by mouth daily. Patient takes sporadically, Disp: , Rfl:  .  cholecalciferol (VITAMIN D) 1000 UNITS tablet, Take 1,000 Units by mouth daily., Disp: , Rfl:  .  diazepam (VALIUM) 2 MG tablet, Take 1 tablet (2 mg total) by mouth every 12 (twelve) hours as needed., Disp: 60 tablet, Rfl: 4 .  doxycycline (VIBRA-TABS) 100 MG tablet, Take 1 tablet (100 mg total) by mouth 2 (two) times daily., Disp: 20 tablet, Rfl: 0 .  famotidine (PEPCID) 20 MG tablet, Take 1 tablet (20 mg total) by mouth 2 (two) times daily., Disp: 60 tablet, Rfl: 11 .  fish oil-omega-3 fatty acids 1000 MG capsule, Take 1 g by mouth daily. , Disp: , Rfl:  .  latanoprost (XALATAN) 0.005 % ophthalmic solution, , Disp: , Rfl:  .  meclizine (ANTIVERT) 25 MG tablet, Take 1 tablet (25 mg total) by mouth 3 (three) times daily as needed for dizziness., Disp: 90 tablet, Rfl: 1 .  ondansetron (ZOFRAN ODT) 4 MG disintegrating tablet, Take 1 tablet (4 mg total) by mouth every 8 (eight) hours as needed for nausea or vomiting., Disp: 20 tablet, Rfl: 0 Social History   Socioeconomic History  . Marital status:  Married    Spouse name: Not on file  . Number of children: Not on file  . Years of education: Not on file  . Highest education level: Not on file  Social Needs  . Financial resource strain: Not on file  . Food insecurity - worry: Not on file  . Food insecurity - inability: Not on file  . Transportation needs - medical: Not on file  . Transportation needs - non-medical: Not on file  Occupational History  . Not on file  Tobacco Use  . Smoking status: Former Research scientist (life sciences)  . Smokeless tobacco: Never Used  . Tobacco comment: quit 1982  Substance and Sexual Activity  . Alcohol use: No  . Drug use: No  . Sexual activity: No  Other Topics Concern  . Not on file  Social History Narrative  . Not on file   Family History  Problem Relation Age of Onset  . Lung cancer Maternal Grandfather     Objective: Office vital signs reviewed. BP 139/85   Temp (!) 96.9 F (36.1 C) (Oral)   Ht 5\' 7"  (1.702 m)   Wt 134 lb (60.8 kg)   BMI 20.99 kg/m   Physical Examination:  General: Awake, alert, well nourished, No acute distress Skin: Dehisced wound of left middle anger appears to be improving.  He is got  good granulation tissue.  There is still a small gap at the apex of the wound but this seems to be filling in.  No exudate, purulence, erythema, joint swelling.  No tenderness to palpation.  Assessment/ Plan: 72 y.o. male   1. Dehiscence of laceration wound, subsequent encounter Continue to keep finger in extension if able.  We discussed that some flexion is okay so long as he is not putting significant amount of tension on the wound.  Keep wound clean with gentle soap and water.  I think that this can be managed outpatient without involvement of specialist at this point.  Continue antibiotic.  Follow-up as needed.    Janora Norlander, DO [place] [phone]

## 2017-05-26 ENCOUNTER — Ambulatory Visit: Payer: Medicare HMO | Admitting: Family Medicine

## 2017-06-16 ENCOUNTER — Telehealth: Payer: Self-pay | Admitting: Family Medicine

## 2017-06-19 ENCOUNTER — Other Ambulatory Visit: Payer: Self-pay | Admitting: *Deleted

## 2017-06-19 DIAGNOSIS — K219 Gastro-esophageal reflux disease without esophagitis: Secondary | ICD-10-CM

## 2017-06-19 DIAGNOSIS — I10 Essential (primary) hypertension: Secondary | ICD-10-CM

## 2017-06-19 DIAGNOSIS — N400 Enlarged prostate without lower urinary tract symptoms: Secondary | ICD-10-CM

## 2017-06-19 NOTE — Progress Notes (Unsigned)
PSA

## 2017-06-19 NOTE — Telephone Encounter (Signed)
Future lab orders placed  -pt aware

## 2017-06-19 NOTE — Progress Notes (Signed)
Labs placed for [facility] = aware

## 2017-06-23 ENCOUNTER — Other Ambulatory Visit: Payer: Self-pay | Admitting: Family Medicine

## 2017-06-23 DIAGNOSIS — I10 Essential (primary) hypertension: Secondary | ICD-10-CM | POA: Diagnosis not present

## 2017-06-23 DIAGNOSIS — N400 Enlarged prostate without lower urinary tract symptoms: Secondary | ICD-10-CM | POA: Diagnosis not present

## 2017-06-23 DIAGNOSIS — K219 Gastro-esophageal reflux disease without esophagitis: Secondary | ICD-10-CM | POA: Diagnosis not present

## 2017-06-26 DIAGNOSIS — H40012 Open angle with borderline findings, low risk, left eye: Secondary | ICD-10-CM | POA: Diagnosis not present

## 2017-06-26 DIAGNOSIS — H401111 Primary open-angle glaucoma, right eye, mild stage: Secondary | ICD-10-CM | POA: Diagnosis not present

## 2017-06-26 DIAGNOSIS — Z961 Presence of intraocular lens: Secondary | ICD-10-CM | POA: Diagnosis not present

## 2017-06-26 LAB — PSA, TOTAL AND FREE
PSA, % FREE: 22 % — AB (ref >25)
PSA, FREE: 0.2 ng/mL
PSA, TOTAL: 0.9 ng/mL (ref <=4.0)

## 2017-06-26 LAB — BASIC METABOLIC PANEL WITH GFR
BUN/Creatinine Ratio: 6 (calc) (ref 6–22)
BUN: 5 mg/dL — ABNORMAL LOW (ref 7–25)
CALCIUM: 8.9 mg/dL (ref 8.6–10.3)
CO2: 27 mmol/L (ref 20–32)
CREATININE: 0.77 mg/dL (ref 0.70–1.25)
Chloride: 96 mmol/L — ABNORMAL LOW (ref 98–110)
GFR, EST NON AFRICAN AMERICAN: 93 mL/min/{1.73_m2} (ref >=60)
GFR, Est African American: 107 mL/min/{1.73_m2} (ref >=60)
GLUCOSE: 93 mg/dL (ref 65–99)
Potassium: 4.3 mmol/L (ref 3.5–5.3)
SODIUM: 130 mmol/L — AB (ref 135–146)

## 2017-06-26 LAB — LIPID PANEL
CHOLESTEROL: 172 mg/dL (ref <200)
HDL: 81 mg/dL (ref >40)
LDL Cholesterol (Calc): 79 mg/dL (calc)
Non-HDL Cholesterol (Calc): 91 mg/dL (calc) (ref <130)
Total CHOL/HDL Ratio: 2.1 (calc) (ref <5.0)
Triglycerides: 50 mg/dL (ref <150)

## 2017-06-26 LAB — CBC WITH DIFFERENTIAL/PLATELET
BASOS PCT: 0.6 %
Basophils Absolute: 28 cells/uL (ref 0–200)
Eosinophils Absolute: 89 cells/uL (ref 15–500)
Eosinophils Relative: 1.9 %
HEMATOCRIT: 37.5 % — AB (ref 38.5–50.0)
HEMOGLOBIN: 13.3 g/dL (ref 13.2–17.1)
LYMPHS ABS: 973 {cells}/uL (ref 850–3900)
MCH: 32.2 pg (ref 27.0–33.0)
MCHC: 35.5 g/dL (ref 32.0–36.0)
MCV: 90.8 fL (ref 80.0–100.0)
MPV: 9.3 fL (ref 7.5–12.5)
Monocytes Relative: 10.2 %
NEUTROS ABS: 3130 {cells}/uL (ref 1500–7800)
Neutrophils Relative %: 66.6 %
Platelets: 259 10*3/uL (ref 140–400)
RBC: 4.13 10*6/uL — AB (ref 4.20–5.80)
RDW: 11.6 % (ref 11.0–15.0)
Total Lymphocyte: 20.7 %
WBC: 4.7 10*3/uL (ref 3.8–10.8)
WBCMIX: 479 {cells}/uL (ref 200–950)

## 2017-06-26 LAB — HEPATIC FUNCTION PANEL
AG Ratio: 2.1 (calc) (ref 1.0–2.5)
ALBUMIN MSPROF: 4.1 g/dL (ref 3.6–5.1)
ALT: 18 U/L (ref 9–46)
AST: 21 U/L (ref 10–35)
Alkaline phosphatase (APISO): 91 U/L (ref 40–115)
BILIRUBIN DIRECT: 0.2 mg/dL (ref 0.0–0.2)
GLOBULIN: 2 g/dL (ref 1.9–3.7)
Indirect Bilirubin: 0.6 mg/dL (calc) (ref 0.2–1.2)
Total Bilirubin: 0.8 mg/dL (ref 0.2–1.2)
Total Protein: 6.1 g/dL (ref 6.1–8.1)

## 2017-07-07 ENCOUNTER — Ambulatory Visit (INDEPENDENT_AMBULATORY_CARE_PROVIDER_SITE_OTHER): Payer: Medicare HMO | Admitting: Family Medicine

## 2017-07-07 ENCOUNTER — Encounter: Payer: Self-pay | Admitting: Family Medicine

## 2017-07-07 VITALS — BP 127/75 | HR 69 | Temp 97.3°F | Ht 67.0 in | Wt 132.0 lb

## 2017-07-07 DIAGNOSIS — Z23 Encounter for immunization: Secondary | ICD-10-CM | POA: Diagnosis not present

## 2017-07-07 DIAGNOSIS — N400 Enlarged prostate without lower urinary tract symptoms: Secondary | ICD-10-CM

## 2017-07-07 DIAGNOSIS — I10 Essential (primary) hypertension: Secondary | ICD-10-CM | POA: Diagnosis not present

## 2017-07-07 DIAGNOSIS — E871 Hypo-osmolality and hyponatremia: Secondary | ICD-10-CM

## 2017-07-07 DIAGNOSIS — K219 Gastro-esophageal reflux disease without esophagitis: Secondary | ICD-10-CM

## 2017-07-07 DIAGNOSIS — E559 Vitamin D deficiency, unspecified: Secondary | ICD-10-CM | POA: Diagnosis not present

## 2017-07-07 DIAGNOSIS — H4050X1 Glaucoma secondary to other eye disorders, unspecified eye, mild stage: Secondary | ICD-10-CM

## 2017-07-07 LAB — URINALYSIS, COMPLETE
Bilirubin, UA: NEGATIVE
GLUCOSE, UA: NEGATIVE
KETONES UA: NEGATIVE
Leukocytes, UA: NEGATIVE
NITRITE UA: NEGATIVE
PROTEIN UA: NEGATIVE
RBC, UA: NEGATIVE
SPEC GRAV UA: 1.01 (ref 1.005–1.030)
Urobilinogen, Ur: 0.2 mg/dL (ref 0.2–1.0)
pH, UA: 7 (ref 5.0–7.5)

## 2017-07-07 LAB — MICROSCOPIC EXAMINATION
BACTERIA UA: NONE SEEN
Epithelial Cells (non renal): NONE SEEN /hpf (ref 0–10)
RBC, UA: NONE SEEN /hpf (ref 0–2)
Renal Epithel, UA: NONE SEEN /hpf
WBC, UA: NONE SEEN /hpf (ref 0–5)

## 2017-07-07 MED ORDER — FLUTICASONE PROPIONATE 50 MCG/ACT NA SUSP: 2.0000 | Freq: Every day | NASAL | 6 refills | Status: DC

## 2017-07-07 MED ORDER — DIAZEPAM 2 MG PO TABS: 2.0000 mg | ORAL_TABLET | Freq: Two times a day (BID) | ORAL | 5 refills | Status: DC | PRN

## 2017-07-07 NOTE — Progress Notes (Signed)
Subjective:    Patient ID: Wesley Dolores., male    DOB: October 14, 1945, 72 y.o.   MRN: [id number]  HPI  Pt here for follow up and management of chronic medical problems which includes hypertension and GERD. He is taking medication regularly.  The patient is doing well overall.  He is requesting refills on his diazepam and Flonase and is due to get his rectal exam today.  Plus a urinalysis.  He will also get the Prevnar today.  He has had lab work done and this will be reviewed with during the visit today.  The CBC had a normal white blood cell count.  Hemoglobin was stable at 13.3 and the platelet count was adequate.  Cholesterol numbers with traditional lipid testing had an LDL C that was 79 and triglycerides are good at 50 with a good cholesterol being excellent at 81.  The blood sugar was good and the creatinine was normal.  The sodium and chloride were slightly decreased as they have been in the past.  All liver function tests were normal.  The PSA result was 0.9 and stable compared to past readings.  The patient is pleasant and alert today.  He denies any chest pain pressure tightness or shortness of breath.  He denies any trouble with nausea vomiting diarrhea blood in the stool or black tarry bowel movements.  He takes his Pepcid maybe once a week for heartburn and reflux.  He denies any trouble passing his water including burning pain or frequency.  He does see the ophthalmologist about every 6 months because of his diagnosis of glaucoma.  Another significant finding or part of the history which probably plays a role with his hyponatremia is the fact that he drinks up to 2 gallons of water a day.  The patient does take Metamucil for his constipation.      Patient Active Problem List   Diagnosis Date Noted  . Non-rheumatic mitral regurgitation 07/06/2016  . Vertigo 11/08/2012  . VBI (vertebrobasilar insufficiency) 11/08/2012  . HTN (hypertension), currently controlled without medication  11/08/2012  . Anxiety 11/08/2012   Outpatient Encounter Medications as of 07/07/2017  Medication Sig  . aspirin EC 325 MG tablet Take 81 mg by mouth daily. Patient takes sporadically  . cholecalciferol (VITAMIN D) 1000 UNITS tablet Take 1,000 Units by mouth daily.  . diazepam (VALIUM) 2 MG tablet Take 1 tablet (2 mg total) by mouth every 12 (twelve) hours as needed.  . famotidine (PEPCID) 20 MG tablet Take 1 tablet (20 mg total) by mouth 2 (two) times daily.  . fish oil-omega-3 fatty acids 1000 MG capsule Take 1 g by mouth daily.   Marland Kitchen latanoprost (XALATAN) 0.005 % ophthalmic solution   . meclizine (ANTIVERT) 25 MG tablet Take 1 tablet (25 mg total) by mouth 3 (three) times daily as needed for dizziness.  . ondansetron (ZOFRAN ODT) 4 MG disintegrating tablet Take 1 tablet (4 mg total) by mouth every 8 (eight) hours as needed for nausea or vomiting.  . [DISCONTINUED] doxycycline (VIBRA-TABS) 100 MG tablet Take 1 tablet (100 mg total) by mouth 2 (two) times daily.   No facility-administered encounter medications on file as of 07/07/2017.      Review of Systems  Constitutional: Negative.   HENT: Negative.   Eyes: Negative.   Respiratory: Negative.   Cardiovascular: Negative.   Gastrointestinal: Negative.   Endocrine: Negative.   Genitourinary: Negative.   Musculoskeletal: Negative.   Skin: Negative.   Allergic/Immunologic: Negative.  Neurological: Negative.   Hematological: Negative.   Psychiatric/Behavioral: Negative.        Objective:   Physical Exam  Constitutional: He is oriented to person, place, and time. He appears well-developed and well-nourished. No distress.  Patient is pleasant and relaxed and feeling well  HENT:  Head: Normocephalic and atraumatic.  Right Ear: External ear normal.  Left Ear: External ear normal.  Nose: Nose normal.  Mouth/Throat: Oropharynx is clear and moist. No oropharyngeal exudate.  Eyes: Pupils are equal, round, and reactive to light.  Conjunctivae and EOM are normal. Right eye exhibits no discharge. Left eye exhibits no discharge. No scleral icterus.  Neck: Normal range of motion. Neck supple. No thyromegaly present.  No bruits thyromegaly or anterior cervical adenopathy  Cardiovascular: Normal rate, regular rhythm, normal heart sounds and intact distal pulses.  No murmur heard. Heart is regular at 60/min  Pulmonary/Chest: Effort normal and breath sounds normal. No respiratory distress. He has no wheezes. He has no rales. He exhibits no tenderness.  Clear anteriorly and posteriorly with no axillary adenopathy  Abdominal: Soft. Bowel sounds are normal. He exhibits no mass. There is no tenderness. There is no rebound and no guarding.  No liver or spleen enlargement no masses no bruits and no inguinal adenopathy  Genitourinary: Rectum normal and penis normal.  Genitourinary Comments: The prostate is slightly enlarged but soft and smooth.  The external genitalia were within normal limits and no inguinal hernias were palpable.  No rectal masses.  Musculoskeletal: Normal range of motion. He exhibits no edema.  Lymphadenopathy:    He has no cervical adenopathy.  Neurological: He is alert and oriented to person, place, and time. He has normal reflexes. No cranial nerve deficit.  Skin: Skin is warm and dry. No rash noted.  Psychiatric: He has a normal mood and affect. His behavior is normal. Judgment and thought content normal.  Nursing note and vitals reviewed.  BP 127/75 (BP Location: Left Arm)   Pulse 69   Temp (!) 97.3 F (36.3 C) (Oral)   Ht 5\' 7"  (1.702 m)   Wt 132 lb (59.9 kg)   BMI 20.67 kg/m         Assessment & Plan:  1. Benign prostatic hyperplasia, unspecified whether lower urinary tract symptoms present -Patient is having no problems with passing his water. - Urinalysis, Complete  2. Essential hypertension -The blood pressure is good without any medication.  3. Gastroesophageal reflux disease,  esophagitis presence not specified -He will continue to take Pepcid as needed  4. Vitamin D deficiency -He will continue with vitamin D replacement  5. Chronic hyponatremia -He may try to reduce his water intake slightly and this would probably help his delusional hyponatremia.  6. 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 fluticasone (FLONASE) 50 MCG/ACT nasal spray    Sig: Place 2 sprays into both nostrils daily.    Dispense:  16 g    Refill:  6  . diazepam (VALIUM) 2 MG tablet    Sig: Take 1 tablet (2 mg total) by mouth every 12 (twelve) hours as needed.    Dispense:  60 tablet    Refill:  5   Patient Instructions                       Medicare Annual Wellness Visit  [facility] and the medical  providers at [facility] strive to bring you the best medical care.  In doing so we not only want to address your current medical conditions and concerns but also to detect new conditions early and prevent illness, disease and health-related problems.    Medicare offers a yearly Wellness Visit which allows our clinical staff to assess your need for preventative services including immunizations, lifestyle education, counseling to decrease risk of preventable diseases and screening for fall risk and other medical concerns.    This visit is provided free of charge (no copay) for all Medicare recipients. The clinical pharmacists at [facility] have begun to conduct these Wellness Visits which will also include a thorough review of all your medications.    As you primary medical provider recommend that you make an appointment for your Annual Wellness Visit if you have not done so already this year.  You may set up this appointment before you leave today or you may call back ([phone]) and schedule an appointment.   Please make sure when you call that you mention that you are scheduling your Annual Wellness Visit with the clinical pharmacist so that the appointment may be made for the proper length of time.    Continue with current treatment and therapeutic lifestyle changes Follow-up with ophthalmology as planned   Arrie Senate MD

## 2017-07-07 NOTE — Patient Instructions (Addendum)
Medicare Annual Wellness Visit  [facility] and the medical providers at [facility] strive to bring you the best medical care.  In doing so we not only want to address your current medical conditions and concerns but also to detect new conditions early and prevent illness, disease and health-related problems.    Medicare offers a yearly Wellness Visit which allows our clinical staff to assess your need for preventative services including immunizations, lifestyle education, counseling to decrease risk of preventable diseases and screening for fall risk and other medical concerns.    This visit is provided free of charge (no copay) for all Medicare recipients. The clinical pharmacists at [facility] have begun to conduct these Wellness Visits which will also include a thorough review of all your medications.    As you primary medical provider recommend that you make an appointment for your Annual Wellness Visit if you have not done so already this year.  You may set up this appointment before you leave today or you may call back ([phone]) and schedule an appointment.  Please make sure when you call that you mention that you are scheduling your Annual Wellness Visit with the clinical pharmacist so that the appointment may be made for the proper length of time.    Continue with current treatment and therapeutic lifestyle changes Follow-up with ophthalmology as planned

## 2017-07-10 MED ORDER — ACYCLOVIR 5 % EX OINT: 1.0000 "application " | TOPICAL_OINTMENT | CUTANEOUS | 1 refills | Status: DC

## 2017-07-10 NOTE — Addendum Note (Signed)
Addended by: Zannie Cove on: 07/10/2017 08:32 AM   Modules accepted: Orders

## 2017-09-14 DIAGNOSIS — Z961 Presence of intraocular lens: Secondary | ICD-10-CM | POA: Diagnosis not present

## 2017-09-14 DIAGNOSIS — H40012 Open angle with borderline findings, low risk, left eye: Secondary | ICD-10-CM | POA: Diagnosis not present

## 2017-09-14 DIAGNOSIS — H10413 Chronic giant papillary conjunctivitis, bilateral: Secondary | ICD-10-CM | POA: Diagnosis not present

## 2017-09-14 DIAGNOSIS — H401111 Primary open-angle glaucoma, right eye, mild stage: Secondary | ICD-10-CM | POA: Diagnosis not present

## 2017-09-26 ENCOUNTER — Encounter: Payer: Self-pay | Admitting: Family Medicine

## 2017-09-26 ENCOUNTER — Ambulatory Visit (INDEPENDENT_AMBULATORY_CARE_PROVIDER_SITE_OTHER): Payer: Medicare HMO | Admitting: Family Medicine

## 2017-09-26 VITALS — BP 144/84 | HR 93 | Temp 98.5°F | Ht 67.0 in | Wt 132.0 lb

## 2017-09-26 DIAGNOSIS — R401 Stupor: Secondary | ICD-10-CM

## 2017-09-26 DIAGNOSIS — E871 Hypo-osmolality and hyponatremia: Secondary | ICD-10-CM | POA: Insufficient documentation

## 2017-09-26 DIAGNOSIS — I10 Essential (primary) hypertension: Secondary | ICD-10-CM | POA: Diagnosis not present

## 2017-09-26 DIAGNOSIS — W102XXA Fall (on)(from) incline, initial encounter: Secondary | ICD-10-CM | POA: Diagnosis not present

## 2017-09-26 LAB — GLUCOSE HEMOCUE WAIVED: Glu Hemocue Waived: 90 mg/dL (ref 65–99)

## 2017-09-26 NOTE — Progress Notes (Signed)
Subjective: CC: fall, nausea PCP: [name], MD CZY:SAYT Kiril Hippe. is a 72 y.o. male presenting to clinic today for:  1. Fall Patient sustained a mechanical fall earlier today where he tripped up a ladder.  He notes he fell onto his left side.  He did not hit his head.  However, he notes that he felt slightly sick on his stomach after incident and continues to feel a little "dazed" so his wife told him to come to be evaluated.  He notes that he had been on the ladder all day in the heat.  He also states that he had not had anything to eat until after the fall, during which time he had a pack of nabs.  He reports that after he fell down, he had some dull neck pain on the left, which he took 2 mg of Valium for.  He also had a slight headache and took an aspirin, which has relieved with this.  The nausea resolved with Pepto-Bismol.  Denies any swelling or discoloration of the shoulder.  He has full active range of motion of his neck and shoulder.  No numbness or tingling anywhere.  No vision changes.  No dizziness.  2. Hypertension Patient reports that his blood pressure is always elevated when he first gets to the office.  He is not currently on any antihypertensives.  Denies visual changes, vomiting, chest pain, or shortness of breath.   ROS: Per HPI  Allergies  Allergen Reactions  . Percocet [Oxycodone-Acetaminophen] Nausea And Vomiting  . Sulfa Antibiotics Nausea Only   Past Medical History:  Diagnosis Date  . Anxiety   . Arthritis   . Glaucoma   . History of high blood pressure     Current Outpatient Medications:  .  acyclovir ointment (ZOVIRAX) 5 %, Apply 1 application topically every 3 (three) hours., Disp: 15 g, Rfl: 1 .  aspirin EC 325 MG tablet, Take 81 mg by mouth daily. Patient takes sporadically, Disp: , Rfl:  .  cholecalciferol (VITAMIN D) 1000 UNITS tablet, Take 1,000 Units by mouth daily., Disp: , Rfl:  .  diazepam (VALIUM) 2 MG tablet, Take 1 tablet (2 mg  total) by mouth every 12 (twelve) hours as needed., Disp: 60 tablet, Rfl: 5 .  famotidine (PEPCID) 20 MG tablet, Take 1 tablet (20 mg total) by mouth 2 (two) times daily., Disp: 60 tablet, Rfl: 11 .  fish oil-omega-3 fatty acids 1000 MG capsule, Take 1 g by mouth daily. , Disp: , Rfl:  .  fluticasone (FLONASE) 50 MCG/ACT nasal spray, Place 2 sprays into both nostrils daily., Disp: 16 g, Rfl: 6 .  latanoprost (XALATAN) 0.005 % ophthalmic solution, , Disp: , Rfl:  .  meclizine (ANTIVERT) 25 MG tablet, Take 1 tablet (25 mg total) by mouth 3 (three) times daily as needed for dizziness., Disp: 90 tablet, Rfl: 1 .  ondansetron (ZOFRAN ODT) 4 MG disintegrating tablet, Take 1 tablet (4 mg total) by mouth every 8 (eight) hours as needed for nausea or vomiting., Disp: 20 tablet, Rfl: 0 Social History   Socioeconomic History  . Marital status: Married    Spouse name: Not on file  . Number of children: Not on file  . Years of education: Not on file  . Highest education level: Not on file  Occupational History  . Not on file  Social Needs  . Financial resource strain: Not on file  . Food insecurity:    Worry: Not on file  Inability: Not on file  . Transportation needs:    Medical: Not on file    Non-medical: Not on file  Tobacco Use  . Smoking status: Former Research scientist (life sciences)  . Smokeless tobacco: Never Used  . Tobacco comment: quit 1982  Substance and Sexual Activity  . Alcohol use: No  . Drug use: No  . Sexual activity: Never  Lifestyle  . Physical activity:    Days per week: Not on file    Minutes per session: Not on file  . Stress: Not on file  Relationships  . Social connections:    Talks on phone: Not on file    Gets together: Not on file    Attends religious service: Not on file    Active member of club or organization: Not on file    Attends meetings of clubs or organizations: Not on file    Relationship status: Not on file  . Intimate partner violence:    Fear of current or ex  partner: Not on file    Emotionally abused: Not on file    Physically abused: Not on file    Forced sexual activity: Not on file  Other Topics Concern  . Not on file  Social History Narrative  . Not on file   Family History  Problem Relation Age of Onset  . Lung cancer Maternal Grandfather     Objective: Office vital signs reviewed. BP (!) 144/84   Pulse 93   Temp 98.5 F (36.9 C) (Oral)   Ht 5\' 7"  (1.702 m)   Wt 132 lb (59.9 kg)   BMI 20.67 kg/m   Physical Examination:  General: Awake, alert, well nourished, well appearing male, No acute distress HEENT: Normocephalic, atraumatic.    Neck: No masses palpated. No lymphadenopathy    Ears: Tympanic membranes intact, normal light reflex, no erythema, no bulging    Eyes: Left pupil reactive to light, Right pupil irregular shape (patient notes this is chronic). Extraocular membranes intact, sclera white    Throat: moist mucus membranes, symmetric rise of palate Cardio: regular rate and rhythm, S1S2 heard, no murmurs appreciated Pulm: clear to auscultation bilaterally, no wheezes, rhonchi or rales; normal work of breathing on room air Extremities: warm, well perfused, No edema, cyanosis or clubbing; +2 pulses bilaterally MSK: Normal gait and normal station  C-spine: Patient has full painless active range of motion in extension, rotation.  He has some discomfort in flexion.  No midline tenderness to palpation.  No paraspinal muscle tenderness to palpation.  No palpable bony abnormalities.  Upper extremities: Patient has full active range of motion of bilateral shoulders.  There are no visible or palpable bony abnormalities. Skin: dry; intact; no rashes or lesions Neuro: 5/5 UE and LE Strength and light touch sensation grossly intact, cranial nerves II through XII grossly intact.  Normal upper extremity and lower extremity cerebellar testing.  Alert and oriented x3.  Assessment/ Plan: 72 y.o. male   1. Fall (on)(from) incline,  initial encounter Mechanical fall.  Did not injure head.  No evidence of bony abnormalities or injury. - Glucose Hemocue Waived  2. Dazed state Possibly related to being overheated as he was outside on the lateral day.  He also has a history of hyponatremia.  We will check a BMP. BG 90 here in office.  We discussed reasons for emergent evaluation the emergency department.  If symptoms are not totally resolved by tomorrow, I did recommend that he seek reevaluation. - Basic Metabolic Panel - Glucose  Hemocue Waived  3. Essential hypertension Blood pressure elevated but was within acceptable range for age after repeat. - Basic Metabolic Panel  4. Chronic hyponatremia - Basic Metabolic Panel   Orders Placed This Encounter  Procedures  . Basic Metabolic Panel  . Glucose Hemocue Candelaria Arenas, [place] [phone]

## 2017-09-26 NOTE — Patient Instructions (Signed)
You had labs performed today.  You will be contacted with the results of the labs once they are available, usually in the next 3 business days for routine lab work.    I suspect your symptoms are related to being overheated and having fallen.  You had a normal neurologic exam today.  No evidence of bony injury.  We discussed that if your symptoms are to worsen that he should seek immediate medical attention the emergency department.

## 2017-09-27 LAB — BASIC METABOLIC PANEL
BUN / CREAT RATIO: 5 — AB (ref 10–24)
BUN: 4 mg/dL — AB (ref 8–27)
CO2: 22 mmol/L (ref 20–29)
CREATININE: 0.84 mg/dL (ref 0.76–1.27)
Calcium: 9.3 mg/dL (ref 8.6–10.2)
Chloride: 90 mmol/L — ABNORMAL LOW (ref 96–106)
GFR calc Af Amer: 103 mL/min/{1.73_m2} (ref >59)
GFR calc non Af Amer: 89 mL/min/{1.73_m2} (ref >59)
GLUCOSE: 96 mg/dL (ref 65–99)
Potassium: 4.1 mmol/L (ref 3.5–5.2)
SODIUM: 127 mmol/L — AB (ref 134–144)

## 2017-10-16 DIAGNOSIS — D485 Neoplasm of uncertain behavior of skin: Secondary | ICD-10-CM | POA: Diagnosis not present

## 2017-10-16 DIAGNOSIS — L819 Disorder of pigmentation, unspecified: Secondary | ICD-10-CM | POA: Diagnosis not present

## 2017-10-16 DIAGNOSIS — L821 Other seborrheic keratosis: Secondary | ICD-10-CM | POA: Diagnosis not present

## 2017-10-16 DIAGNOSIS — L814 Other melanin hyperpigmentation: Secondary | ICD-10-CM | POA: Diagnosis not present

## 2017-10-16 DIAGNOSIS — Z85828 Personal history of other malignant neoplasm of skin: Secondary | ICD-10-CM | POA: Diagnosis not present

## 2017-10-16 DIAGNOSIS — D229 Melanocytic nevi, unspecified: Secondary | ICD-10-CM | POA: Diagnosis not present

## 2017-10-16 DIAGNOSIS — L905 Scar conditions and fibrosis of skin: Secondary | ICD-10-CM | POA: Diagnosis not present

## 2017-10-16 IMAGING — DX DG CHEST 2V
2 series · 2 of 2 positions shown · non-contrast
Comparison: 12/25/2013

CLINICAL DATA: Routine physical

EXAM:
CHEST  2 VIEW

[chest pa]
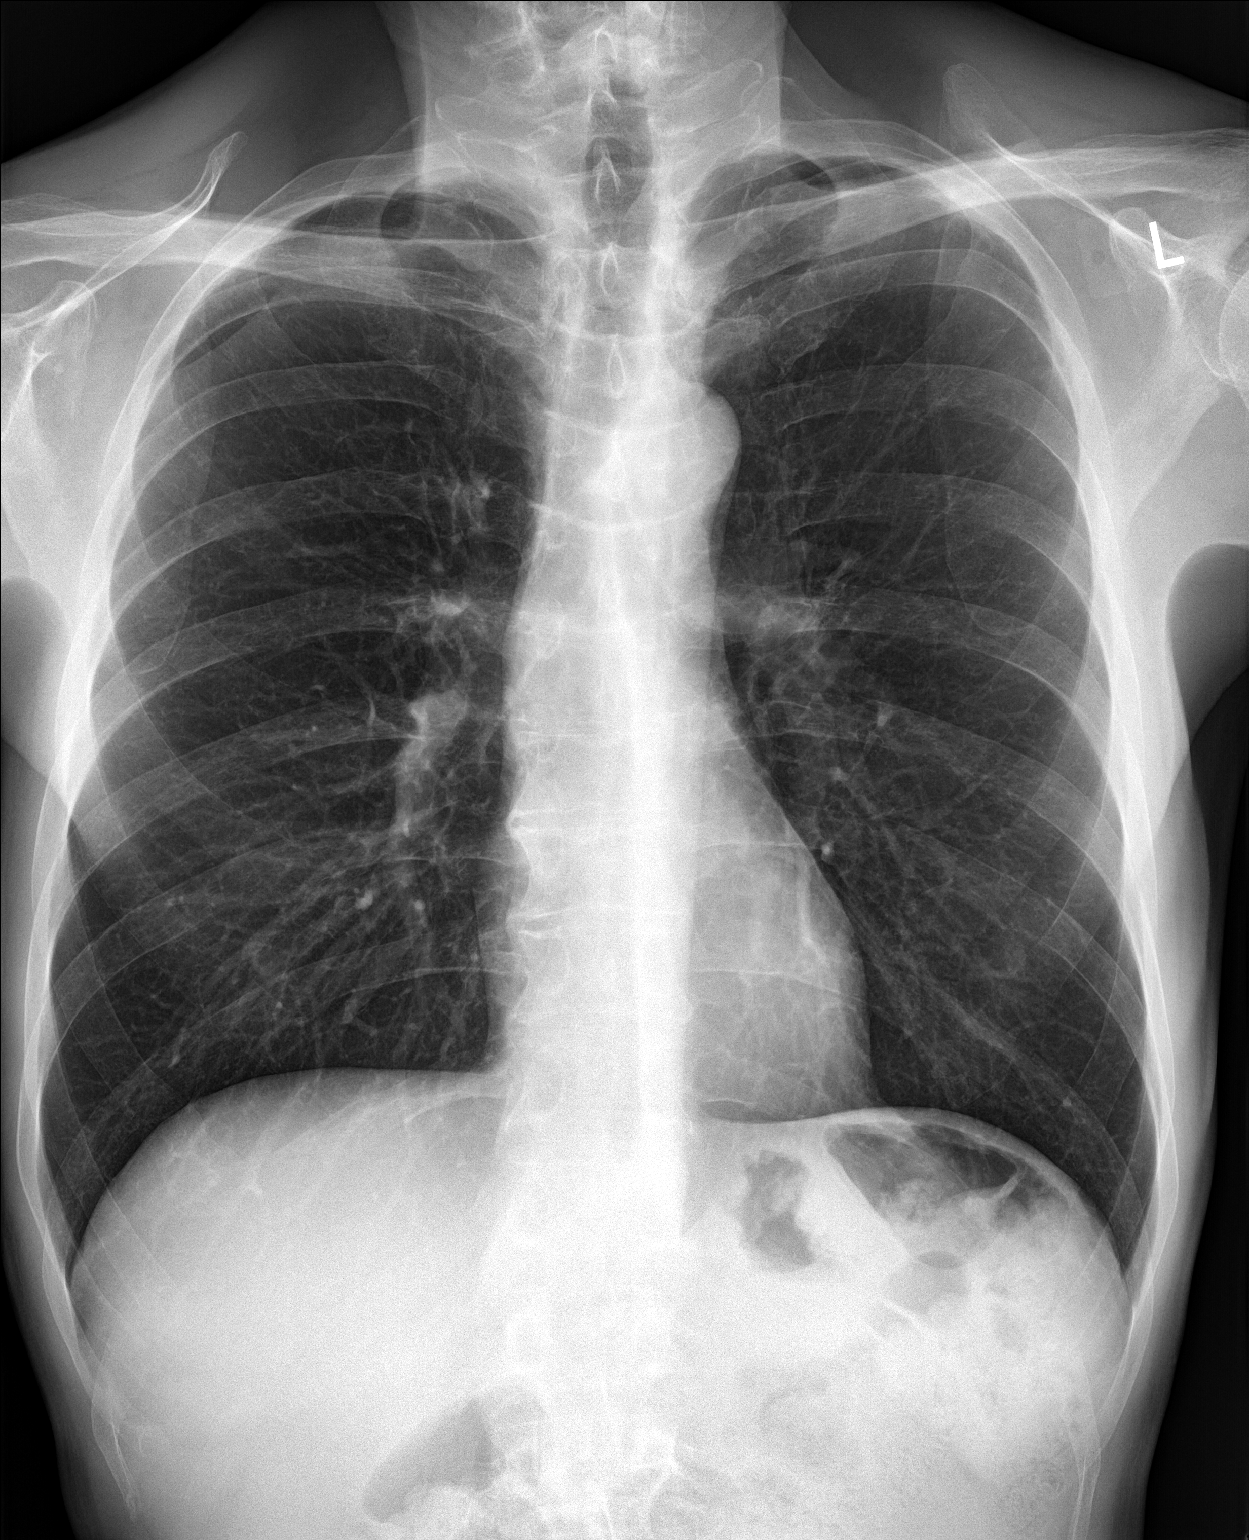

[chest lat]
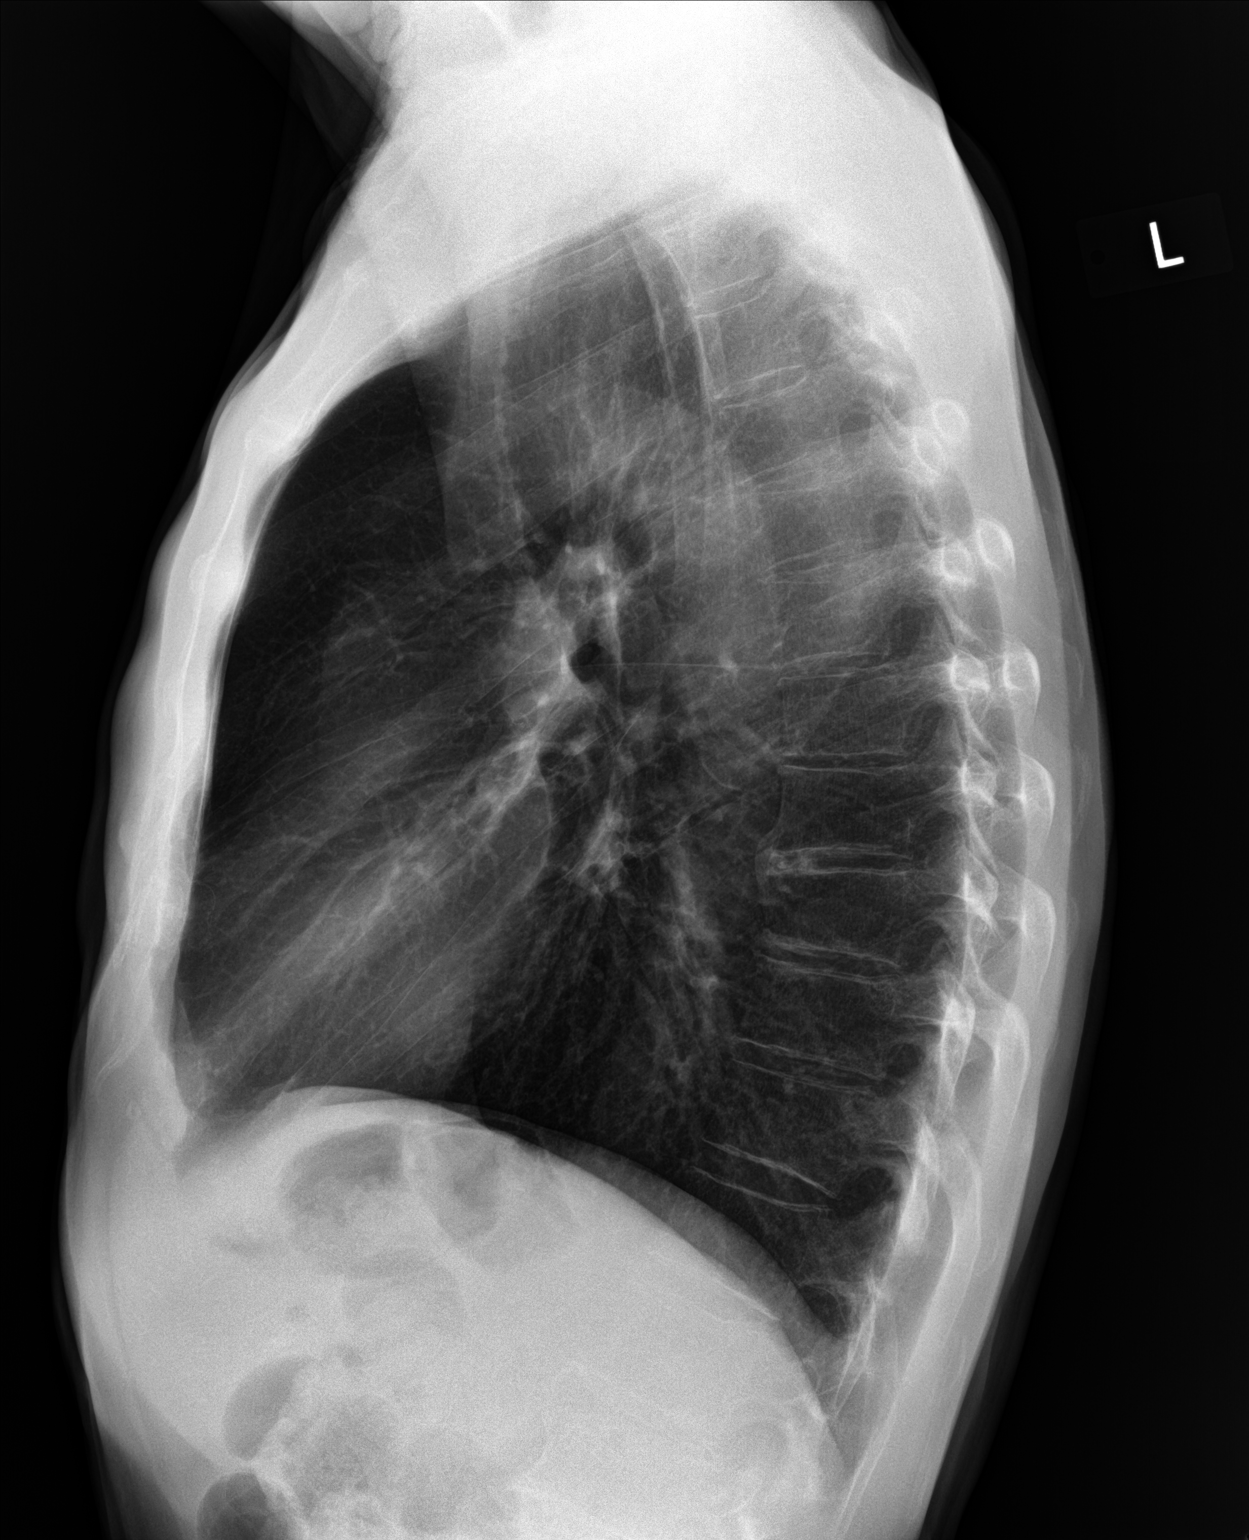

[2 of 2 positions shown; findings below may reference images not displayed]

FINDINGS: The heart size and mediastinal contours are within normal limits.
Both lungs are clear. The visualized skeletal structures are
unremarkable.
IMPRESSION: No active cardiopulmonary disease.

## 2018-01-03 DIAGNOSIS — M1711 Unilateral primary osteoarthritis, right knee: Secondary | ICD-10-CM | POA: Diagnosis not present

## 2018-01-10 DIAGNOSIS — M1712 Unilateral primary osteoarthritis, left knee: Secondary | ICD-10-CM | POA: Insufficient documentation

## 2018-01-10 DIAGNOSIS — M170 Bilateral primary osteoarthritis of knee: Secondary | ICD-10-CM | POA: Diagnosis not present

## 2018-01-10 DIAGNOSIS — M1711 Unilateral primary osteoarthritis, right knee: Secondary | ICD-10-CM | POA: Insufficient documentation

## 2018-01-17 DIAGNOSIS — M170 Bilateral primary osteoarthritis of knee: Secondary | ICD-10-CM | POA: Diagnosis not present

## 2018-01-17 DIAGNOSIS — M1711 Unilateral primary osteoarthritis, right knee: Secondary | ICD-10-CM | POA: Diagnosis not present

## 2018-01-17 DIAGNOSIS — M1712 Unilateral primary osteoarthritis, left knee: Secondary | ICD-10-CM | POA: Diagnosis not present

## 2018-01-30 ENCOUNTER — Other Ambulatory Visit: Payer: Self-pay | Admitting: Family Medicine

## 2018-01-30 NOTE — Telephone Encounter (Signed)
Last seen 09/26/17  DWM

## 2018-03-07 DIAGNOSIS — M1612 Unilateral primary osteoarthritis, left hip: Secondary | ICD-10-CM | POA: Diagnosis not present

## 2018-03-07 DIAGNOSIS — M545 Low back pain: Secondary | ICD-10-CM | POA: Diagnosis not present

## 2018-03-13 DIAGNOSIS — M1612 Unilateral primary osteoarthritis, left hip: Secondary | ICD-10-CM | POA: Diagnosis not present

## 2018-03-13 DIAGNOSIS — M545 Low back pain: Secondary | ICD-10-CM | POA: Diagnosis not present

## 2018-03-14 ENCOUNTER — Ambulatory Visit (INDEPENDENT_AMBULATORY_CARE_PROVIDER_SITE_OTHER): Payer: Medicare HMO | Admitting: Pediatrics

## 2018-03-14 ENCOUNTER — Encounter: Payer: Self-pay | Admitting: Pediatrics

## 2018-03-14 ENCOUNTER — Ambulatory Visit: Payer: Medicare HMO | Admitting: Pediatrics

## 2018-03-14 VITALS — BP 148/82 | HR 72 | Temp 96.8°F | Ht 67.0 in | Wt 130.2 lb

## 2018-03-14 DIAGNOSIS — L729 Follicular cyst of the skin and subcutaneous tissue, unspecified: Secondary | ICD-10-CM

## 2018-03-14 DIAGNOSIS — L853 Xerosis cutis: Secondary | ICD-10-CM

## 2018-03-14 NOTE — Patient Instructions (Addendum)
Cetaphil or CeraVe thick white moisturizer in jar, use twice a day on all dry skin

## 2018-03-14 NOTE — Progress Notes (Signed)
  Subjective:   Patient ID: Wesley Fowler., male    DOB: Jun 16, 1945, 72 y.o.   MRN: [id number] CC: Elbow Pain (Left, itching, burning, notice 1 week ago)  HPI: Wesley Fowler. is a 72 y.o. male   About 5 to 6 days ago started noticing small knot under the skin on his lateral left elbow.  A couple days ago he was cleaning out a wood stove, hit the elbow, causing some bruising around it.  He does not think this the knot under the skin is gotten either better or worse over the last few days.  Has never had one there before.  He does have some itching and burning in the skin in his upper arm.  No fevers, no elbow pain.  Relevant past medical, surgical, family and social history reviewed. Allergies and medications reviewed and updated. Social History   Tobacco Use  Smoking Status Former Smoker  Smokeless Tobacco Never Used  Tobacco Comment   quit 1982   ROS: Per HPI   Objective:    BP (!) 148/82   Pulse 72   Temp (!) 96.8 F (36 C) (Oral)   Ht 5\' 7"  (1.702 m)   Wt 130 lb 3.2 oz (59.1 kg)   BMI 20.39 kg/m   Wt Readings from Last 3 Encounters:  03/14/18 130 lb 3.2 oz (59.1 kg)  09/26/17 132 lb (59.9 kg)  07/07/17 132 lb (59.9 kg)    Gen: NAD, alert, cooperative with exam, NCAT EYES: EOMI, no conjunctival injection, or no icterus CV: distal pulses 2+ b/l Resp:normal WOB Ext: No edema, warm Neuro: Alert and oriented MSK: normal muscle bulk Skin: Approximately 0.5cm easily mobile nodule present under the skin on lateral left elbow..  No redness or induration.  No fluctuance. Left lateral elbow and upper forearm with some yellow-purple bruising Left upper arm with some excoriations, xerosis  Assessment & Plan:  Daelen was seen today for elbow pain.  Diagnoses and all orders for this visit:  Xerosis of skin Discussed skin care, thick white moisturizer twice a day on all skin  Skin cyst Discussed next steps, could refer to dermatology for removal.  For now patient okay  with watching and waiting.  We will see if improves or starts to bother him more.  Follow up plan: Return if symptoms worsen or fail to improve. Assunta Found, MD [place]

## 2018-03-16 DIAGNOSIS — H40012 Open angle with borderline findings, low risk, left eye: Secondary | ICD-10-CM | POA: Diagnosis not present

## 2018-03-16 DIAGNOSIS — M5136 Other intervertebral disc degeneration, lumbar region: Secondary | ICD-10-CM | POA: Insufficient documentation

## 2018-03-16 DIAGNOSIS — H10413 Chronic giant papillary conjunctivitis, bilateral: Secondary | ICD-10-CM | POA: Diagnosis not present

## 2018-03-16 DIAGNOSIS — H401111 Primary open-angle glaucoma, right eye, mild stage: Secondary | ICD-10-CM | POA: Diagnosis not present

## 2018-03-16 DIAGNOSIS — M545 Low back pain: Secondary | ICD-10-CM | POA: Diagnosis not present

## 2018-03-16 DIAGNOSIS — H401112 Primary open-angle glaucoma, right eye, moderate stage: Secondary | ICD-10-CM | POA: Diagnosis not present

## 2018-03-16 DIAGNOSIS — Z961 Presence of intraocular lens: Secondary | ICD-10-CM | POA: Diagnosis not present

## 2018-03-26 ENCOUNTER — Ambulatory Visit: Payer: Medicare HMO | Attending: Orthopedic Surgery | Admitting: Physical Therapy

## 2018-03-26 ENCOUNTER — Encounter: Payer: Self-pay | Admitting: Physical Therapy

## 2018-03-26 DIAGNOSIS — M545 Low back pain, unspecified: Secondary | ICD-10-CM

## 2018-03-26 NOTE — Therapy (Addendum)
[facility] [place], [place], [address] Phone: [phone]   Fax:  [phone]  Physical Therapy Treatment PHYSICAL THERAPY DISCHARGE SUMMARY  Visits from Start of Care: 1  Current functional level related to goals / functional outcomes: See below   Remaining deficits: achieved   Education / Equipment: HEP Plan: Patient agrees to discharge.  Patient goals were met. Patient is being discharged due to the patient's request.  ?????  Gabriela Eves, PT, DPT    Patient Details  Name: Wesley Fowler. MRN: [id number] Date of Birth: 11/10/1945 Referring Provider (PT): Melina Schools, MD   Encounter Date: 03/26/2018  PT End of Session - 03/26/18 1312    Visit Number  1    Number of Visits  1    Date for PT Re-Evaluation  03/26/18    PT Start Time  1115    PT Stop Time  1150    PT Time Calculation (min)  35 min    Activity Tolerance  Patient tolerated treatment well    Behavior During Therapy  [facility] for tasks assessed/performed       Past Medical History:  Diagnosis Date  . Anxiety   . Arthritis   . Glaucoma   . History of high blood pressure     Past Surgical History:  Procedure Laterality Date  . KNEE ARTHROSCOPY  2004   right  . LAPAROSCOPIC INGUINAL HERNIA REPAIR  03/15/11   bilateral  . RETINAL DETACHMENT SURGERY  2004   right    There were no vitals filed for this visit.      [facility] PT Assessment - 03/26/18 0001      Assessment   Medical Diagnosis  degeneration of lumbar disc    Referring Provider (PT)  Melina Schools, MD    Onset Date/Surgical Date  --   June 2019   Prior Therapy  no      Precautions   Precautions  None      Restrictions   Weight Bearing Restrictions  No      Balance Screen   Has the patient fallen in the past 6 months  No    Has the patient had a decrease in activity level because of a fear of falling?   No    Is the patient reluctant to leave their home because  of a fear of falling?   No      Home Environment   Living Environment  Private residence      ROM / Strength   AROM / PROM / Strength  Strength      Strength   Strength Assessment Site  Knee;Hip    Right/Left Hip  Right;Left    Right Hip Flexion  4/5    Right Hip Extension  4/5    Right Hip ABduction  4+/5    Left Hip Flexion  4/5    Left Hip Extension  4+/5    Left Hip ABduction  4+/5    Right/Left Knee  Right;Left    Right Knee Flexion  5/5    Right Knee Extension  5/5    Left Knee Flexion  5/5    Left Knee Extension  5/5      Palpation   Palpation comment  tenderness with deep palpation to left lumbar paraspinals and left hip                           PT Education -  03/26/18 1258    Education Details  draw ins, sit to stands, side stepping, clam shells, marching, bridges    Person(s) Educated  Patient    Methods  Explanation;Handout    Comprehension  Verbalized understanding;Returned demonstration       PT Short Term Goals - 03/26/18 1434      PT SHORT TERM GOAL #1   Title  Patient will be independent with HEP    Time  1    Period  Days    Status  Achieved               Plan - 03/26/18 1441    Clinical Impression Statement  Patient is a 72 year old male who presents to physical therapy with reports of low back pain L>R, and left hip pain that began insidiously 6 months ago. Patient noted with [facility] lumbar ROM and good LE strength. Patient noted  tenderness to deep palpation to lumbar left lumbar paraspinals, left SIJ and lateral left hip. Patient educated on finding balance with activities at home to prevent increase pain as well as keeping active, "motion is lotion". Patient guided through exercises for HEP as patient requested evaluation and HEP only. Patient reported understanding and demonstrated good form. Patient instructed to call if any questions. Patient reported understanding.     Clinical Presentation  Stable    Clinical  Decision Making  Low    Rehab Potential  Good    PT Frequency  One time visit    PT Treatment/Interventions  Patient/family education    PT Next Visit Plan  Discharge at eval    PT Home Exercise Plan  see patient education section    Consulted and Agree with Plan of Care  Patient       Patient will benefit from skilled therapeutic intervention in order to improve the following deficits and impairments:  Pain, Decreased strength, Decreased endurance, Postural dysfunction  Visit Diagnosis: Acute low back pain, unspecified back pain laterality, unspecified whether sciatica present     Problem List Patient Active Problem List   Diagnosis Date Noted  . Chronic hyponatremia 09/26/2017  . Non-rheumatic mitral regurgitation 07/06/2016  . Vertigo 11/08/2012  . VBI (vertebrobasilar insufficiency) 11/08/2012  . HTN (hypertension), currently controlled without medication 11/08/2012  . Anxiety 11/08/2012   Gabriela Eves, PT, DPT 03/26/2018, 2:42 PM  [facility] [address] [place], [place], [address] Phone: [phone]   Fax:  [phone]  Name: Wesley Fowler. MRN: [id number] Date of Birth: 02/10/1946

## 2018-05-02 ENCOUNTER — Encounter (INDEPENDENT_AMBULATORY_CARE_PROVIDER_SITE_OTHER): Payer: Medicare HMO | Admitting: Ophthalmology

## 2018-05-02 DIAGNOSIS — H43813 Vitreous degeneration, bilateral: Secondary | ICD-10-CM

## 2018-05-02 DIAGNOSIS — H353112 Nonexudative age-related macular degeneration, right eye, intermediate dry stage: Secondary | ICD-10-CM | POA: Diagnosis not present

## 2018-05-02 DIAGNOSIS — H353121 Nonexudative age-related macular degeneration, left eye, early dry stage: Secondary | ICD-10-CM

## 2018-06-06 ENCOUNTER — Encounter: Payer: Self-pay | Admitting: Family Medicine

## 2018-06-06 ENCOUNTER — Ambulatory Visit (INDEPENDENT_AMBULATORY_CARE_PROVIDER_SITE_OTHER): Payer: Medicare HMO | Admitting: Family Medicine

## 2018-06-06 VITALS — BP 131/80 | HR 72 | Temp 97.4°F | Ht 67.0 in | Wt 127.0 lb

## 2018-06-06 DIAGNOSIS — E559 Vitamin D deficiency, unspecified: Secondary | ICD-10-CM | POA: Diagnosis not present

## 2018-06-06 DIAGNOSIS — R350 Frequency of micturition: Secondary | ICD-10-CM

## 2018-06-06 DIAGNOSIS — N400 Enlarged prostate without lower urinary tract symptoms: Secondary | ICD-10-CM

## 2018-06-06 DIAGNOSIS — R1030 Lower abdominal pain, unspecified: Secondary | ICD-10-CM | POA: Diagnosis not present

## 2018-06-06 DIAGNOSIS — I10 Essential (primary) hypertension: Secondary | ICD-10-CM | POA: Diagnosis not present

## 2018-06-06 LAB — URINALYSIS, COMPLETE
Bilirubin, UA: NEGATIVE
Glucose, UA: NEGATIVE
Ketones, UA: NEGATIVE
LEUKOCYTES UA: NEGATIVE
NITRITE UA: NEGATIVE
Protein, UA: NEGATIVE
Specific Gravity, UA: 1.01 (ref 1.005–1.030)
Urobilinogen, Ur: 0.2 mg/dL (ref 0.2–1.0)
pH, UA: 7 (ref 5.0–7.5)

## 2018-06-06 LAB — MICROSCOPIC EXAMINATION
Bacteria, UA: NONE SEEN
Epithelial Cells (non renal): NONE SEEN /hpf (ref 0–10)
Renal Epithel, UA: NONE SEEN /hpf
WBC, UA: NONE SEEN /hpf (ref 0–5)

## 2018-06-06 NOTE — Patient Instructions (Signed)
Continue to drink plenty of fluids and stay well-hydrated Clear liquids for 24 hours (like 7-Up, ginger ale, Sprite, Jello, frozen pops) Full liquids the second 24-hours (like potato soup, tomato soup, chicken noodle soup) Bland diet the third 24-hours (boiled and baked foods, no fried or greasy foods) Avoid milk, cheese, ice cream and dairy products for 72 hours. Avoid caffeine (cola drinks, coffee, tea, Mountain Dew, Mellow Yellow) Take in small amounts, but frequently. Tylenol  as needed for aches pains and fever  We will call with lab work results as soon as these become available  Take Tylenol as needed for pain and avoid Advil

## 2018-06-06 NOTE — Progress Notes (Signed)
Subjective:    Patient ID: Wesley Fowler., male    DOB: Jun 19, 1945, 73 y.o.   MRN: [id number]  HPI Patient here today for urinary freqency, lower abdominal pain and groin pain.  This abdominal pain which is lower in midline and also noted in the growing area has been going on for about 4 days.  The patient has had constipation as usual with no blood in the stool or change in bowel habits.  He denies any blood in the urine but has had burning frequency and urgency during this time.  He denies any chest pain shortness of breath or upper respiratory symptoms.    Patient Active Problem List   Diagnosis Date Noted  . Chronic hyponatremia 09/26/2017  . Non-rheumatic mitral regurgitation 07/06/2016  . Vertigo 11/08/2012  . VBI (vertebrobasilar insufficiency) 11/08/2012  . HTN (hypertension), currently controlled without medication 11/08/2012  . Anxiety 11/08/2012   Outpatient Encounter Medications as of 06/06/2018  Medication Sig  . aspirin EC 325 MG tablet Take 81 mg by mouth daily. Patient takes sporadically  . cholecalciferol (VITAMIN D) 1000 UNITS tablet Take 1,000 Units by mouth daily.  . diazepam (VALIUM) 2 MG tablet TAKE 1 TABLET BY MOUTH EVERY 12 HOURS AS NEEDED  . famotidine (PEPCID) 20 MG tablet Take 1 tablet (20 mg total) by mouth 2 (two) times daily.  . fish oil-omega-3 fatty acids 1000 MG capsule Take 1 g by mouth daily.   . fluticasone (FLONASE) 50 MCG/ACT nasal spray Place 2 sprays into both nostrils daily.  Marland Kitchen latanoprost (XALATAN) 0.005 % ophthalmic solution   . meclizine (ANTIVERT) 25 MG tablet Take 1 tablet (25 mg total) by mouth 3 (three) times daily as needed for dizziness.  . ondansetron (ZOFRAN ODT) 4 MG disintegrating tablet Take 1 tablet (4 mg total) by mouth every 8 (eight) hours as needed for nausea or vomiting.  . [DISCONTINUED] acyclovir ointment (ZOVIRAX) 5 % Apply 1 application topically every 3 (three) hours.   No facility-administered encounter medications  on file as of 06/06/2018.      Review of Systems  Constitutional: Negative.   HENT: Negative.   Eyes: Negative.   Respiratory: Negative.   Cardiovascular: Negative.   Gastrointestinal: Positive for abdominal pain (lower -center ).  Endocrine: Negative.   Genitourinary: Positive for frequency.       Groin pain   Musculoskeletal: Negative.   Skin: Negative.   Allergic/Immunologic: Negative.   Neurological: Negative.   Hematological: Negative.   Psychiatric/Behavioral: Negative.        Objective:   Physical Exam Vitals signs and nursing note reviewed.  Constitutional:      General: He is not in acute distress.    Appearance: He is well-developed.     Comments: Patient is relaxed and does not seem to be in any immediate distress.  He is not a complainer.  HENT:     Head: Normocephalic and atraumatic.     Right Ear: External ear normal.     Left Ear: External ear normal.     Nose: Nose normal.     Mouth/Throat:     Mouth: Mucous membranes are moist.     Pharynx: No pharyngeal swelling or oropharyngeal exudate.  Eyes:     General: No scleral icterus.       Right eye: No discharge.        Left eye: No discharge.     Extraocular Movements: Extraocular movements intact.     Conjunctiva/sclera: Conjunctivae normal.  Pupils: Pupils are equal, round, and reactive to light.  Neck:     Musculoskeletal: Normal range of motion and neck supple.     Thyroid: No thyromegaly.     Trachea: No tracheal deviation.  Cardiovascular:     Rate and Rhythm: Normal rate and regular rhythm.     Heart sounds: Normal heart sounds. No murmur. No gallop.   Pulmonary:     Effort: Pulmonary effort is normal. No respiratory distress.     Breath sounds: Normal breath sounds. No wheezing or rales.  Abdominal:     General: Abdomen is flat. Bowel sounds are normal. There is distension. There is no abdominal bruit.     Palpations: Abdomen is soft. There is no hepatomegaly, splenomegaly or mass.      Tenderness: There is abdominal tenderness in the right lower quadrant, suprapubic area and left lower quadrant. There is no guarding or rebound. Negative signs include Murphy's sign and McBurney's sign.     Hernia: There is no hernia in the umbilical area, right inguinal area or left inguinal area.     Comments: The patient has some distention and tympany.  He is tender across the lower abdomen in general without masses liver enlargement or spleen enlargement.  Genitourinary:    Penis: Normal.      Scrotum/Testes: Normal.        Right: Tenderness or swelling not present.        Left: Tenderness or swelling not present.     Prostate: Enlarged. Not tender.     Rectum: Normal.     Comments: The external genitalia were within normal limits with no supratesticular or fullness orTenderness.  No hernias were palpable.  The prostate was minimally enlarged without any tenderness lumps or masses. Musculoskeletal: Normal range of motion.        General: No tenderness.  Lymphadenopathy:     Cervical: No cervical adenopathy.  Skin:    General: Skin is warm and dry.     Findings: No rash.  Neurological:     Mental Status: He is alert and oriented to person, place, and time.     Deep Tendon Reflexes: Reflexes are normal and symmetric.  Psychiatric:        Mood and Affect: Mood normal. Mood is not anxious.        Behavior: Behavior normal.        Thought Content: Thought content normal.        Judgment: Judgment normal.    BP 131/80 (BP Location: Left Arm)   Pulse 72   Temp (!) 97.4 F (36.3 C) (Oral)   Ht 5\' 7"  (1.702 m)   Wt 127 lb (57.6 kg)   BMI 19.89 kg/m         Assessment & Plan:  1. Urine frequency - Urinalysis, Complete - Urine Culture - CBC with Differential/Platelet; Future - Hepatic function panel; Future  2. Lower abdominal pain -The patient is tender across the lower abdomen in the suprapubic area with a lot of gas being present in the abdomen. - CBC with  Differential/Platelet; Future - Hepatic function panel; Future - Basic Metabolic Panel; Future  3. Inguinal pain, unspecified laterality -No inguinal hernias palpated - CBC with Differential/Platelet; Future - Hepatic function panel; Future  4. Benign prostatic hyperplasia, unspecified whether lower urinary tract symptoms present -Prostate was only minimally enlarged and not tender to palpation and no lumps or masses were noted - PSA, total and free; Future - Basic Metabolic Panel;  Future  5. Essential hypertension -Blood pressure was good today and he will continue with current treatment - Lipid panel; Future - Basic Metabolic Panel; Future  6. Vitamin D deficiency - VITAMIN D 25 Hydroxy (Vit-D Deficiency, Fractures); Future  Patient Instructions  Continue to drink plenty of fluids and stay well-hydrated Clear liquids for 24 hours (like 7-Up, ginger ale, Sprite, Jello, frozen pops) Full liquids the second 24-hours (like potato soup, tomato soup, chicken noodle soup) Bland diet the third 24-hours (boiled and baked foods, no fried or greasy foods) Avoid milk, cheese, ice cream and dairy products for 72 hours. Avoid caffeine (cola drinks, coffee, tea, Mountain Dew, Mellow Yellow) Take in small amounts, but frequently. Tylenol  as needed for aches pains and fever  We will call with lab work results as soon as these become available  Take Tylenol as needed for pain and avoid Advil  Arrie Senate MD

## 2018-06-07 ENCOUNTER — Other Ambulatory Visit: Payer: Self-pay | Admitting: Family Medicine

## 2018-06-07 ENCOUNTER — Other Ambulatory Visit: Payer: Self-pay | Admitting: *Deleted

## 2018-06-07 ENCOUNTER — Telehealth: Payer: Self-pay | Admitting: Family Medicine

## 2018-06-07 DIAGNOSIS — R1030 Lower abdominal pain, unspecified: Secondary | ICD-10-CM

## 2018-06-07 DIAGNOSIS — I10 Essential (primary) hypertension: Secondary | ICD-10-CM | POA: Diagnosis not present

## 2018-06-07 DIAGNOSIS — R319 Hematuria, unspecified: Secondary | ICD-10-CM

## 2018-06-07 DIAGNOSIS — R350 Frequency of micturition: Secondary | ICD-10-CM

## 2018-06-07 DIAGNOSIS — E559 Vitamin D deficiency, unspecified: Secondary | ICD-10-CM | POA: Diagnosis not present

## 2018-06-07 DIAGNOSIS — N400 Enlarged prostate without lower urinary tract symptoms: Secondary | ICD-10-CM | POA: Diagnosis not present

## 2018-06-07 LAB — BASIC METABOLIC PANEL WITH GFR
BUN/Creatinine Ratio: 9 (calc) (ref 6–22)
BUN: 6 mg/dL — ABNORMAL LOW (ref 7–25)
CO2: 28 mmol/L (ref 20–32)
Calcium: 9 mg/dL (ref 8.6–10.3)
Chloride: 98 mmol/L (ref 98–110)
Creat: 0.68 mg/dL — ABNORMAL LOW (ref 0.70–1.18)
GFR, Est African American: 112 mL/min/{1.73_m2} (ref >=60)
GFR, Est Non African American: 97 mL/min/{1.73_m2} (ref >=60)
Glucose, Bld: 99 mg/dL (ref 65–99)
Potassium: 4.2 mmol/L (ref 3.5–5.3)
Sodium: 133 mmol/L — ABNORMAL LOW (ref 135–146)

## 2018-06-07 LAB — HEPATIC FUNCTION PANEL
AG RATIO: 2.2 (calc) (ref 1.0–2.5)
ALKALINE PHOSPHATASE (APISO): 84 U/L (ref 35–144)
ALT: 12 U/L (ref 9–46)
AST: 15 U/L (ref 10–35)
Albumin: 4.2 g/dL (ref 3.6–5.1)
Bilirubin, Direct: 0.2 mg/dL (ref 0.0–0.2)
Globulin: 1.9 g/dL (calc) (ref 1.9–3.7)
Indirect Bilirubin: 0.7 mg/dL (calc) (ref 0.2–1.2)
Total Bilirubin: 0.9 mg/dL (ref 0.2–1.2)
Total Protein: 6.1 g/dL (ref 6.1–8.1)

## 2018-06-07 LAB — CBC WITH DIFFERENTIAL/PLATELET
Absolute Monocytes: 874 cells/uL (ref 200–950)
BASOS ABS: 47 {cells}/uL (ref 0–200)
Basophils Relative: 0.5 %
EOS ABS: 103 {cells}/uL (ref 15–500)
EOS PCT: 1.1 %
HCT: 36 % — ABNORMAL LOW (ref 38.5–50.0)
Hemoglobin: 12.7 g/dL — ABNORMAL LOW (ref 13.2–17.1)
Lymphs Abs: 1043 cells/uL (ref 850–3900)
MCH: 31.9 pg (ref 27.0–33.0)
MCHC: 35.3 g/dL (ref 32.0–36.0)
MCV: 90.5 fL (ref 80.0–100.0)
MPV: 9.9 fL (ref 7.5–12.5)
Monocytes Relative: 9.3 %
Neutro Abs: 7332 cells/uL (ref 1500–7800)
Neutrophils Relative %: 78 %
Platelets: 250 10*3/uL (ref 140–400)
RBC: 3.98 10*6/uL — ABNORMAL LOW (ref 4.20–5.80)
RDW: 11.9 % (ref 11.0–15.0)
Total Lymphocyte: 11.1 %
WBC: 9.4 10*3/uL (ref 3.8–10.8)

## 2018-06-07 NOTE — Telephone Encounter (Signed)
What symptoms do you have? Lower stomach is still hurting and was told to call back this morning to let Roselyn Reef know how he was. Wants to have CT scan schedule  How long have you been sick? Five days  Have you been seen for this problem? Yes 3-4 with Dr. Laurance Flatten  If your provider decides to give you a prescription, which pharmacy would you like for it to be sent to? [facility] in [place]   Patient informed that this information will be sent to the clinical staff for review and that they should receive a follow up call.

## 2018-06-07 NOTE — Telephone Encounter (Signed)
Ordered and scheduled

## 2018-06-07 NOTE — Telephone Encounter (Signed)
Please schedule ct of abdomen and pelvis with stone protocol

## 2018-06-08 ENCOUNTER — Ambulatory Visit (HOSPITAL_COMMUNITY)
Admission: RE | Admit: 2018-06-08 | Discharge: 2018-06-08 | Disposition: A | Discharge status: Home / Self Care | Payer: Medicare HMO | Source: Ambulatory Visit | Attending: Family Medicine | Admitting: Family Medicine

## 2018-06-08 ENCOUNTER — Other Ambulatory Visit: Payer: Self-pay | Admitting: *Deleted

## 2018-06-08 DIAGNOSIS — K639 Disease of intestine, unspecified: Secondary | ICD-10-CM | POA: Insufficient documentation

## 2018-06-08 DIAGNOSIS — R319 Hematuria, unspecified: Secondary | ICD-10-CM

## 2018-06-08 LAB — LIPID PANEL
Cholesterol: 154 mg/dL (ref <200)
HDL: 88 mg/dL (ref >=40)
LDL CHOLESTEROL (CALC): 53 mg/dL
Non-HDL Cholesterol (Calc): 66 mg/dL (calc) (ref <130)
Total CHOL/HDL Ratio: 1.8 (calc) (ref <5.0)
Triglycerides: 51 mg/dL (ref <150)

## 2018-06-08 LAB — PSA, TOTAL AND FREE
PSA, % Free: 20 % (calc) — ABNORMAL LOW (ref >25)
PSA, Free: 0.2 ng/mL
PSA, TOTAL: 1 ng/mL (ref <=4.0)

## 2018-06-08 LAB — VITAMIN D 25 HYDROXY (VIT D DEFICIENCY, FRACTURES): Vit D, 25-Hydroxy: 53 ng/mL (ref 30–100)

## 2018-06-08 MED ORDER — IOHEXOL 300 MG/ML  SOLN
100.0000 mL | Freq: Once | INTRAMUSCULAR | Status: AC | PRN
  Administered 2018-06-08: 100 mL via INTRAVENOUS

## 2018-06-08 MED ORDER — METRONIDAZOLE 500 MG PO TABS: 500.0000 mg | ORAL_TABLET | Freq: Three times a day (TID) | ORAL | 0 refills | Status: DC

## 2018-06-08 MED ORDER — CIPROFLOXACIN HCL 500 MG PO TABS: 500.0000 mg | ORAL_TABLET | Freq: Two times a day (BID) | ORAL | 0 refills | Status: DC

## 2018-06-08 MED ORDER — IOHEXOL 300 MG/ML  SOLN
30.0000 mL | Freq: Once | INTRAMUSCULAR | Status: AC | PRN
  Administered 2018-06-08: 30 mL via ORAL

## 2018-06-09 LAB — URINE CULTURE

## 2018-07-03 ENCOUNTER — Telehealth: Payer: Self-pay | Admitting: Family Medicine

## 2018-07-03 DIAGNOSIS — K219 Gastro-esophageal reflux disease without esophagitis: Secondary | ICD-10-CM

## 2018-07-03 MED ORDER — DIAZEPAM 2 MG PO TABS: 2.0000 mg | ORAL_TABLET | Freq: Two times a day (BID) | ORAL | 3 refills | Status: DC | PRN

## 2018-07-03 MED ORDER — FAMOTIDINE 20 MG PO TABS: 20.0000 mg | ORAL_TABLET | Freq: Two times a day (BID) | ORAL | 11 refills | Status: DC

## 2018-07-03 MED ORDER — MECLIZINE HCL 25 MG PO TABS: 25.0000 mg | ORAL_TABLET | Freq: Three times a day (TID) | ORAL | 0 refills | Status: DC | PRN

## 2018-07-03 NOTE — Telephone Encounter (Signed)
[place] with pt - cancelled appt next week - several refills sent in to [facility]

## 2018-07-09 ENCOUNTER — Ambulatory Visit: Payer: Medicare HMO | Admitting: Family Medicine

## 2018-07-09 DIAGNOSIS — M7121 Synovial cyst of popliteal space [Baker], right knee: Secondary | ICD-10-CM | POA: Diagnosis not present

## 2018-07-09 DIAGNOSIS — M1711 Unilateral primary osteoarthritis, right knee: Secondary | ICD-10-CM | POA: Diagnosis not present

## 2018-07-23 DIAGNOSIS — M7121 Synovial cyst of popliteal space [Baker], right knee: Secondary | ICD-10-CM | POA: Insufficient documentation

## 2018-07-23 DIAGNOSIS — M1711 Unilateral primary osteoarthritis, right knee: Secondary | ICD-10-CM | POA: Diagnosis not present

## 2018-09-18 DIAGNOSIS — H401112 Primary open-angle glaucoma, right eye, moderate stage: Secondary | ICD-10-CM | POA: Diagnosis not present

## 2018-09-18 DIAGNOSIS — H40012 Open angle with borderline findings, low risk, left eye: Secondary | ICD-10-CM | POA: Diagnosis not present

## 2018-09-18 DIAGNOSIS — H10413 Chronic giant papillary conjunctivitis, bilateral: Secondary | ICD-10-CM | POA: Diagnosis not present

## 2018-09-18 DIAGNOSIS — Z961 Presence of intraocular lens: Secondary | ICD-10-CM | POA: Diagnosis not present

## 2018-10-01 ENCOUNTER — Encounter: Payer: Self-pay | Admitting: Family Medicine

## 2018-10-01 ENCOUNTER — Other Ambulatory Visit: Payer: Self-pay

## 2018-10-01 ENCOUNTER — Ambulatory Visit: Payer: Medicare HMO | Admitting: Family Medicine

## 2018-10-01 ENCOUNTER — Ambulatory Visit (INDEPENDENT_AMBULATORY_CARE_PROVIDER_SITE_OTHER): Payer: Medicare HMO

## 2018-10-01 ENCOUNTER — Ambulatory Visit (INDEPENDENT_AMBULATORY_CARE_PROVIDER_SITE_OTHER): Payer: Medicare HMO | Admitting: Family Medicine

## 2018-10-01 VITALS — BP 135/78 | HR 80 | Temp 98.7°F | Ht 67.0 in | Wt 127.0 lb

## 2018-10-01 DIAGNOSIS — M25571 Pain in right ankle and joints of right foot: Secondary | ICD-10-CM

## 2018-10-01 DIAGNOSIS — S93401A Sprain of unspecified ligament of right ankle, initial encounter: Secondary | ICD-10-CM

## 2018-10-01 DIAGNOSIS — M25471 Effusion, right ankle: Secondary | ICD-10-CM | POA: Diagnosis not present

## 2018-10-01 MED ORDER — DICLOFENAC SODIUM 1 % TD GEL: 2.0000 g | Freq: Four times a day (QID) | TRANSDERMAL | 0 refills | Status: AC

## 2018-10-01 NOTE — Patient Instructions (Addendum)
It was a pleasure seeing you today, Mr. Negro. Information regarding what we discussed is included in this packet.  Please make an appointment to see me in 2 weeks for ankle recheck.   In a few days you may receive a survey in the mail or online from [employer] regarding your visit with [place] today. Please take a moment to fill this out. Your feedback is very important to our office. It can help [place] better understand your needs as well as improve your experience and satisfaction. Thank you for taking your time to complete it. We care about you.   Ankle Sprain  An ankle sprain is a stretch or tear in one of the tough tissues (ligaments) that connect the bones in your ankle. An ankle sprain can happen when the ankle rolls outward (inversion sprain) or inward (eversion sprain). What are the causes? This condition is caused by rolling or twisting the ankle. What increases the risk? You are more likely to develop this condition if you play sports. What are the signs or symptoms? Symptoms of this condition include:  Pain in your ankle.  Swelling.  Bruising. This may happen right after you sprain your ankle or 1-2 days later.  Trouble standing or walking. How is this diagnosed? This condition is diagnosed with:  A physical exam. During the exam, your doctor will press on certain parts of your foot and ankle and try to move them in certain ways.  X-ray imaging. These may be taken to see how bad the sprain is and to check for broken bones. How is this treated? This condition may be treated with:  A brace or splint. This is used to keep the ankle from moving until it heals.  An elastic bandage. This is used to support the ankle.  Crutches.  Pain medicine.  Surgery. This may be needed if the sprain is very bad.  Physical therapy. This may help to improve movement in the ankle. Follow these instructions at home: If you have a brace or a splint:  Wear the brace or splint as told by  your doctor. Remove it only as told by your doctor.  Loosen the brace or splint if your toes: ? Tingle. ? Lose feeling (become numb). ? Turn cold and blue.  Keep the brace or splint clean.  If the brace or splint is not waterproof: ? Do not let it get wet. ? Cover it with a watertight covering when you take a bath or a shower. If you have an elastic bandage (dressing):  Remove it to shower or bathe.  Try not to move your ankle much, but wiggle your toes from time to time. This helps to prevent swelling.  Adjust the dressing if it feels too tight.  Loosen the dressing if your foot: ? Loses feeling. ? Tingles. ? Becomes cold and blue. Managing pain, stiffness, and swelling   Take over-the-counter and prescription medicines only as told by doctor.  For 2-3 days, keep your ankle raised (elevated) above the level of your heart.  If told, put ice on the injured area: ? If you have a removable brace or splint, remove it as told by your doctor. ? Put ice in a plastic bag. ? Place a towel between your skin and the bag. ? Leave the ice on for 20 minutes, 2-3 times a day. General instructions  Rest your ankle.  Do not use your injured leg to support your body weight until your doctor says that you  can. Use crutches as told by your doctor.  Do not use any products that contain nicotine or tobacco, such as cigarettes, e-cigarettes, and chewing tobacco. If you need help quitting, ask your doctor.  Keep all follow-up visits as told by your doctor. Contact a doctor if:  Your bruises or swelling are quickly getting worse.  Your pain does not get better after you take medicine. Get help right away if:  You cannot feel your toes or foot.  Your foot or toes look blue.  You have very bad pain that gets worse. Summary  An ankle sprain is a stretch or tear in one of the tough tissues (ligaments) that connect the bones in your ankle.  This condition is caused by rolling or  twisting the ankle.  Symptoms include pain, swelling, bruising, and trouble walking.  To help with pain and swelling, put ice on the injured ankle, raise your ankle above the level of your heart, and use an elastic bandage. Also, rest as told by your doctor.  Keep all follow-up visits as told by your doctor. This is important. This information is not intended to replace advice given to you by your health care provider. Make sure you discuss any questions you have with your health care provider. Document Released: 09/07/2007 Document Revised: 08/15/2017 Document Reviewed: 08/15/2017 Elsevier Patient Education  [place].   Because of recent events of COVID-19 ("Coronavirus"), please follow CDC recommendations:   1. Wash your hand frequently 2. Avoid touching your face 3. Stay away from people who are sick 4. If you have symptoms such as fever, cough, shortness of breath then call your healthcare provider for further guidance 5. If you are sick, STAY AT HOME, unless otherwise directed by your healthcare provider. 6. Follow directions from state and national officials regarding staying safe    Please feel free to call our office if any questions or concerns arise.  Warm Regards, Monia Pouch, FNP-C [facility] [address] [place], [place] [address] [phone]

## 2018-10-01 NOTE — Progress Notes (Signed)
[employer] message: Called party temporarily unavailable X 2. Third call, LMOM to reschedule WS Erroneous visit

## 2018-10-02 NOTE — Progress Notes (Signed)
Subjective:  Patient ID: Wesley Dolores., male    DOB: 02-Apr-1946, 73 y.o.   MRN: [id number]  Chief Complaint:  Ankle Pain (Right, injured on Friday)   HPI: Wesley Aird. is a 73 y.o. male presenting on 10/01/2018 for Ankle Pain (Right, injured on Friday)  Pt presents today for pain and swelling to his right ankle after a twisting injury on Friday. States he was walking and had an inversion twisting injury. Pt states he is able to bear weight with some discomfort. He did not fall. No other associated injuries.   Ankle Pain  The incident occurred 3 to 5 days ago. The incident occurred at home. The injury mechanism was an inversion injury and a twisting injury. The pain is present in the right ankle. The pain is at a severity of 5/10. The pain is moderate. The pain has been fluctuating since onset. Pertinent negatives include no inability to bear weight, loss of motion, loss of sensation, muscle weakness, numbness or tingling. He reports no foreign bodies present. The symptoms are aggravated by movement and weight bearing. He has tried acetaminophen for the symptoms. The treatment provided moderate relief.     Relevant past medical, surgical, family, and social history reviewed and updated as indicated.  Allergies and medications reviewed and updated.   Past Medical History:  Diagnosis Date  . Anxiety   . Arthritis   . Glaucoma   . History of high blood pressure     Past Surgical History:  Procedure Laterality Date  . KNEE ARTHROSCOPY  2004   right  . LAPAROSCOPIC INGUINAL HERNIA REPAIR  03/15/11   bilateral  . RETINAL DETACHMENT SURGERY  2004   right    Social History   Socioeconomic History  . Marital status: Married    Spouse name: Not on file  . Number of children: Not on file  . Years of education: Not on file  . Highest education level: Not on file  Occupational History  . Not on file  Social Needs  . Financial resource strain: Not on file  . Food  insecurity    Worry: Not on file    Inability: Not on file  . Transportation needs    Medical: Not on file    Non-medical: Not on file  Tobacco Use  . Smoking status: Former Research scientist (life sciences)  . Smokeless tobacco: Never Used  . Tobacco comment: quit 1982  Substance and Sexual Activity  . Alcohol use: No  . Drug use: No  . Sexual activity: Never  Lifestyle  . Physical activity    Days per week: Not on file    Minutes per session: Not on file  . Stress: Not on file  Relationships  . Social Herbalist on phone: Not on file    Gets together: Not on file    Attends religious service: Not on file    Active member of club or organization: Not on file    Attends meetings of clubs or organizations: Not on file    Relationship status: Not on file  . Intimate partner violence    Fear of current or ex partner: Not on file    Emotionally abused: Not on file    Physically abused: Not on file    Forced sexual activity: Not on file  Other Topics Concern  . Not on file  Social History Narrative  . Not on file    Outpatient Encounter Medications as of 10/01/2018  Medication Sig  . aspirin EC 325 MG tablet Take 81 mg by mouth daily. Patient takes sporadically  . diazepam (VALIUM) 2 MG tablet Take 1 tablet (2 mg total) by mouth every 12 (twelve) hours as needed.  . famotidine (PEPCID) 20 MG tablet Take 1 tablet (20 mg total) by mouth 2 (two) times daily.  . fluticasone (FLONASE) 50 MCG/ACT nasal spray Place 2 sprays into both nostrils daily.  Marland Kitchen latanoprost (XALATAN) 0.005 % ophthalmic solution   . Multiple Vitamin (MULTIVITAMIN WITH MINERALS) TABS tablet Take 1 tablet by mouth daily.  . Multiple Vitamins-Minerals (EYE VITAMINS PO) Take 1 tablet by mouth daily.  . [DISCONTINUED] cholecalciferol (VITAMIN D) 1000 UNITS tablet Take 1,000 Units by mouth daily.  . diclofenac sodium (VOLTAREN) 1 % GEL Apply 2 g topically 4 (four) times daily for 14 days.  . [DISCONTINUED] ciprofloxacin (CIPRO)  500 MG tablet Take 1 tablet (500 mg total) by mouth 2 (two) times daily.  . [DISCONTINUED] fish oil-omega-3 fatty acids 1000 MG capsule Take 1 g by mouth daily.   . [DISCONTINUED] meclizine (ANTIVERT) 25 MG tablet Take 1 tablet (25 mg total) by mouth 3 (three) times daily as needed for dizziness.  . [DISCONTINUED] metroNIDAZOLE (FLAGYL) 500 MG tablet Take 1 tablet (500 mg total) by mouth 3 (three) times daily.  . [DISCONTINUED] ondansetron (ZOFRAN ODT) 4 MG disintegrating tablet Take 1 tablet (4 mg total) by mouth every 8 (eight) hours as needed for nausea or vomiting.   No facility-administered encounter medications on file as of 10/01/2018.     Allergies  Allergen Reactions  . Percocet [Oxycodone-Acetaminophen] Nausea And Vomiting  . Sulfa Antibiotics Nausea Only    Review of Systems  Constitutional: Negative for chills, fatigue and fever.  Respiratory: Negative for cough, chest tightness and shortness of breath.   Cardiovascular: Negative for chest pain, palpitations and leg swelling.  Musculoskeletal: Positive for arthralgias, gait problem and joint swelling.  Neurological: Negative for dizziness, tingling, syncope, weakness, light-headedness, numbness and headaches.  Psychiatric/Behavioral: Negative for confusion.  All other systems reviewed and are negative.       Objective:  BP 135/78   Pulse 80   Temp 98.7 F (37.1 C) (Oral)   Ht 5\' 7"  (1.702 m)   Wt 127 lb (57.6 kg)   BMI 19.89 kg/m    Wt Readings from Last 3 Encounters:  10/01/18 127 lb (57.6 kg)  06/06/18 127 lb (57.6 kg)  03/14/18 130 lb 3.2 oz (59.1 kg)    Physical Exam Vitals signs and nursing note reviewed.  Constitutional:      General: He is not in acute distress.    Appearance: Normal appearance. He is well-developed and well-groomed. He is not ill-appearing, toxic-appearing or diaphoretic.  HENT:     Head: Normocephalic and atraumatic.     Jaw: There is normal jaw occlusion.     Right Ear: Hearing  normal.     Left Ear: Hearing normal.     Nose: Nose normal.     Mouth/Throat:     Lips: Pink.     Mouth: Mucous membranes are moist.     Pharynx: Oropharynx is clear. Uvula midline.  Eyes:     General: Lids are normal.     Extraocular Movements: Extraocular movements intact.     Conjunctiva/sclera: Conjunctivae normal.     Pupils: Pupils are equal, round, and reactive to light.  Neck:     Musculoskeletal: Normal range of motion and neck supple.  Thyroid: No thyroid mass, thyromegaly or thyroid tenderness.     Vascular: No carotid bruit or JVD.     Trachea: Trachea and phonation normal.  Cardiovascular:     Rate and Rhythm: Normal rate and regular rhythm.     Chest Wall: PMI is not displaced.     Pulses: Normal pulses.     Heart sounds: Normal heart sounds. No murmur. No friction rub. No gallop.   Pulmonary:     Effort: Pulmonary effort is normal. No respiratory distress.     Breath sounds: Normal breath sounds. No wheezing.  Abdominal:     General: Bowel sounds are normal. There is no distension or abdominal bruit.     Palpations: Abdomen is soft. There is no hepatomegaly or splenomegaly.     Tenderness: There is no abdominal tenderness. There is no right CVA tenderness or left CVA tenderness.     Hernia: No hernia is present.  Musculoskeletal:     Right ankle: He exhibits decreased range of motion (due to pain, pain with inversion and eversion. No pain with dorsiflexion or plantarflexion), swelling and ecchymosis. He exhibits no deformity, no laceration and normal pulse. Tenderness. Lateral malleolus and medial malleolus tenderness found. No AITFL, no CF ligament, no posterior TFL, no head of 5th metatarsal and no proximal fibula tenderness found. Achilles tendon exhibits no pain, no defect and normal Thompson's test results.     Right lower leg: No edema.     Left lower leg: No edema.  Lymphadenopathy:     Cervical: No cervical adenopathy.  Skin:    General: Skin is warm  and dry.     Capillary Refill: Capillary refill takes less than 2 seconds.     Coloration: Skin is not cyanotic, jaundiced or pale.     Findings: No rash.  Neurological:     General: No focal deficit present.     Mental Status: He is alert and oriented to person, place, and time.     Cranial Nerves: Cranial nerves are intact.     Sensory: Sensation is intact.     Motor: Motor function is intact.     Coordination: Coordination is intact.     Gait: Gait is intact.     Deep Tendon Reflexes: Reflexes are normal and symmetric.  Psychiatric:        Attention and Perception: Attention and perception normal.        Mood and Affect: Mood and affect normal.        Speech: Speech normal.        Behavior: Behavior normal. Behavior is cooperative.        Thought Content: Thought content normal.        Cognition and Memory: Cognition and memory normal.        Judgment: Judgment normal.     Results for orders placed or performed in visit on 06/07/18  Lipid panel  Result Value Ref Range   Cholesterol 154 <200 mg/dL   HDL 88 > OR = 40 mg/dL   Triglycerides 51 <150 mg/dL   LDL Cholesterol (Calc) 53 mg/dL (calc)   Total CHOL/HDL Ratio 1.8 <5.0 (calc)   Non-HDL Cholesterol (Calc) 66 <130 mg/dL (calc)  VITAMIN D 25 Hydroxy (Vit-D Deficiency, Fractures)  Result Value Ref Range   Vit D, 25-Hydroxy 53 30 - 100 ng/mL  PSA, total and free  Result Value Ref Range   PSA, Total 1.0 < OR = 4.0 ng/mL   PSA, Free 0.2 ng/mL  PSA, % Free 20 (L) >25 % (calc)     X-Ray: right ankle: No acute findings. Preliminary x-ray reading by Monia Pouch, FNP-C, [facility].   Pertinent labs & imaging results that were available during my care of the patient were reviewed by me and considered in my medical decision making.  Assessment & Plan:  Dequane was seen today for ankle pain.  Diagnoses and all orders for this visit:  Acute right ankle pain Xray unremarkable for acute bony abnormalities. Will inform pt is  radiology reading differs. Soft tissue swelling noted on xray.  -     DG Ankle Complete Right -     diclofenac sodium (VOLTAREN) 1 % GEL; Apply 2 g topically 4 (four) times daily for 14 days.  Sprain of right ankle, unspecified ligament, initial encounter No acute bony abnormalities on imaging. Sprain due to twisting injury, likely lateral sprain due to mechanism or injury and physical exam findings. Tylenol is beneficial. Will add topical NSAID as pt can not tolerate oral NSAID. RICE therapy discussed. ASO applied in office. Pt declined crutches. Report any new or worsening symptoms. Follow up in 2 weeks for reevaluation.  -     diclofenac sodium (VOLTAREN) 1 % GEL; Apply 2 g topically 4 (four) times daily for 14 days.     Continue all other maintenance medications.  Follow up plan: Return in about 2 weeks (around 10/15/2018), or if symptoms worsen or fail to improve, for ankle pain.  Educational handout given for ankle sprain  The above assessment and management plan was discussed with the patient. The patient verbalized understanding of and has agreed to the management plan. Patient is aware to call the clinic if symptoms persist or worsen. Patient is aware when to return to the clinic for a follow-up visit. Patient educated on when it is appropriate to go to the emergency department.   Monia Pouch, FNP-C [facility] [phone]

## 2018-10-03 DIAGNOSIS — H401112 Primary open-angle glaucoma, right eye, moderate stage: Secondary | ICD-10-CM | POA: Diagnosis not present

## 2018-10-15 ENCOUNTER — Ambulatory Visit: Payer: Medicare HMO | Admitting: Family Medicine

## 2018-11-09 DIAGNOSIS — H02883 Meibomian gland dysfunction of right eye, unspecified eyelid: Secondary | ICD-10-CM | POA: Diagnosis not present

## 2018-11-09 DIAGNOSIS — H02886 Meibomian gland dysfunction of left eye, unspecified eyelid: Secondary | ICD-10-CM | POA: Diagnosis not present

## 2018-11-21 DIAGNOSIS — H02886 Meibomian gland dysfunction of left eye, unspecified eyelid: Secondary | ICD-10-CM | POA: Diagnosis not present

## 2018-11-21 DIAGNOSIS — H10413 Chronic giant papillary conjunctivitis, bilateral: Secondary | ICD-10-CM | POA: Diagnosis not present

## 2018-11-21 DIAGNOSIS — H40012 Open angle with borderline findings, low risk, left eye: Secondary | ICD-10-CM | POA: Diagnosis not present

## 2018-11-21 DIAGNOSIS — H02883 Meibomian gland dysfunction of right eye, unspecified eyelid: Secondary | ICD-10-CM | POA: Diagnosis not present

## 2018-11-21 DIAGNOSIS — H401112 Primary open-angle glaucoma, right eye, moderate stage: Secondary | ICD-10-CM | POA: Diagnosis not present

## 2018-11-21 DIAGNOSIS — Z961 Presence of intraocular lens: Secondary | ICD-10-CM | POA: Diagnosis not present

## 2018-11-30 ENCOUNTER — Other Ambulatory Visit: Payer: Self-pay | Admitting: Family Medicine

## 2019-01-25 ENCOUNTER — Ambulatory Visit (INDEPENDENT_AMBULATORY_CARE_PROVIDER_SITE_OTHER): Payer: Medicare HMO | Admitting: Physician Assistant

## 2019-01-25 ENCOUNTER — Telehealth: Payer: Self-pay | Admitting: Family Medicine

## 2019-01-25 ENCOUNTER — Encounter: Payer: Self-pay | Admitting: Physician Assistant

## 2019-01-25 DIAGNOSIS — K5792 Diverticulitis of intestine, part unspecified, without perforation or abscess without bleeding: Secondary | ICD-10-CM

## 2019-01-25 MED ORDER — AMOXICILLIN-POT CLAVULANATE 875-125 MG PO TABS: 1.0000 | ORAL_TABLET | Freq: Two times a day (BID) | ORAL | 0 refills | Status: DC

## 2019-01-25 MED ORDER — DIAZEPAM 2 MG PO TABS: 2.0000 mg | ORAL_TABLET | Freq: Two times a day (BID) | ORAL | 0 refills | Status: DC | PRN

## 2019-01-25 NOTE — Progress Notes (Signed)
1233      Telephone visit  Subjective: CC: Possible flareup of diverticulitis PCP: Baruch Gouty, FNP [id number] Wesley Fowler. is a 73 y.o. male calls for telephone consult today. Patient provides verbal consent for consult held via phone.  Patient is identified with 2 separate identifiers.  At this time the entire area is on COVID-19 social distancing and stay home orders are in place.  Patient is of higher risk and therefore we are performing this by a virtual method.  Location of patient: home Location of provider: [facility] Others present for call: no  This patient has had diverticulitis in the past.  In reviewing his chart back in March he was diagnosed with it and CT confirmed.  He did take Cipro and Flagyl at that time.  However the medication was very bothersome to his stomach, he realized it was the Flagyl.  At this time he is having a flareup with the same symptoms of the diverticulitis.  He denies any fever or chills.  He denies any blood in the stools.  We have discussed an option of Augmentin as seen in up-to-date as a secondary use for people that are intolerant to Flagyl. The medication will be sent and he is to give [place] a call if things do not improve.   ROS: Per HPI  Allergies  Allergen Reactions  . Flagyl [Metronidazole]     GI upset  . Percocet [Oxycodone-Acetaminophen] Nausea And Vomiting  . Sulfa Antibiotics Nausea Only   Past Medical History:  Diagnosis Date  . Anxiety   . Arthritis   . Glaucoma   . History of high blood pressure     Current Outpatient Medications:  .  amoxicillin-clavulanate (AUGMENTIN) 875-125 MG tablet, Take 1 tablet by mouth 2 (two) times daily., Disp: 20 tablet, Rfl: 0 .  aspirin EC 325 MG tablet, Take 81 mg by mouth daily. Patient takes sporadically, Disp: , Rfl:  .  diazepam (VALIUM) 2 MG tablet, Take 1 tablet (2 mg total) by mouth every 12 (twelve) hours as needed., Disp: 60 tablet, Rfl: 0 .  famotidine (PEPCID) 20 MG tablet, Take 1  tablet (20 mg total) by mouth 2 (two) times daily., Disp: 60 tablet, Rfl: 11 .  fluticasone (FLONASE) 50 MCG/ACT nasal spray, Place 2 sprays into both nostrils daily., Disp: 16 g, Rfl: 6 .  latanoprost (XALATAN) 0.005 % ophthalmic solution, , Disp: , Rfl:  .  Multiple Vitamin (MULTIVITAMIN WITH MINERALS) TABS tablet, Take 1 tablet by mouth daily., Disp: , Rfl:  .  Multiple Vitamins-Minerals (EYE VITAMINS PO), Take 1 tablet by mouth daily., Disp: , Rfl:   Assessment/ Plan: 73 y.o. male   1. Diverticulitis - amoxicillin-clavulanate (AUGMENTIN) 875-125 MG tablet; Take 1 tablet by mouth 2 (two) times daily.  Dispense: 20 tablet; Refill: 0   No follow-ups on file.  Continue all other maintenance medications as listed above.  Start time: 12:33 PM End time: 12:44 PM  Meds ordered this encounter  Medications  . diazepam (VALIUM) 2 MG tablet    Sig: Take 1 tablet (2 mg total) by mouth every 12 (twelve) hours as needed.    Dispense:  60 tablet    Refill:  0    Order Specific Question:   Supervising Provider    Answer:   Wesley Fowler [id number]  . amoxicillin-clavulanate (AUGMENTIN) 875-125 MG tablet    Sig: Take 1 tablet by mouth 2 (two) times daily.    Dispense:  20 tablet  Refill:  0    Order Specific Question:   Supervising Provider    Answer:   Wesley Fowler [[id number]]    Particia Nearing PA-C [facility] [phone]

## 2019-01-25 NOTE — Telephone Encounter (Signed)
Tele made for today

## 2019-02-20 ENCOUNTER — Encounter: Payer: Self-pay | Admitting: Family Medicine

## 2019-02-20 ENCOUNTER — Other Ambulatory Visit: Payer: Self-pay

## 2019-02-20 ENCOUNTER — Ambulatory Visit (INDEPENDENT_AMBULATORY_CARE_PROVIDER_SITE_OTHER): Payer: Medicare HMO | Admitting: Family Medicine

## 2019-02-20 VITALS — BP 155/86 | HR 70 | Temp 98.4°F | Resp 20 | Ht 67.0 in | Wt 122.0 lb

## 2019-02-20 DIAGNOSIS — K148 Other diseases of tongue: Secondary | ICD-10-CM | POA: Diagnosis not present

## 2019-02-20 DIAGNOSIS — F419 Anxiety disorder, unspecified: Secondary | ICD-10-CM

## 2019-02-20 DIAGNOSIS — R69 Illness, unspecified: Secondary | ICD-10-CM | POA: Diagnosis not present

## 2019-02-20 MED ORDER — BUPROPION HCL ER (XL) 150 MG PO TB24: 150.0000 mg | ORAL_TABLET | Freq: Two times a day (BID) | ORAL | 1 refills | Status: DC

## 2019-02-20 NOTE — Progress Notes (Signed)
Subjective:  Patient ID: Wesley Fowler., male    DOB: 04-03-1946  Age: 73 y.o. MRN: [id number]  CC: area on tongue   HPI Wesley Fowler. presents for lesion on side of tongue for several weeks. Annoying. It is a little white spot.   Wants to start back on wellbutrin for his anxiety. It has gotten much worse due to isolation from [place]. He had some left over from and old prescription. Taking one daily. Feels it is helping.   Depression screen [facility] 2/9 02/20/2019 10/01/2018 06/06/2018  Decreased Interest 0 0 0  Down, Depressed, Hopeless 0 0 1  PHQ - 2 Score 0 0 1  Altered sleeping - - -  Tired, decreased energy - - -  Change in appetite - - -  Feeling bad or failure about yourself  - - -  Trouble concentrating - - -  Moving slowly or fidgety/restless - - -  Suicidal thoughts - - -  PHQ-9 Score - - -  Difficult doing work/chores - - -    History [name] has a past medical history of Anxiety, Arthritis, Glaucoma, and History of high blood pressure.   He has a past surgical history that includes Retinal detachment surgery (2004); Knee arthroscopy (2004); and Laparoscopic inguinal hernia repair (03/15/11).   His family history includes Lung cancer in his maternal grandfather.He reports that he has quit smoking. He has never used smokeless tobacco. He reports that he does not drink alcohol or use drugs.    ROS Review of Systems  Constitutional: Negative for fever.  Respiratory: Negative for shortness of breath.   Cardiovascular: Negative for chest pain.  Musculoskeletal: Negative for arthralgias.  Skin: Negative for rash.    Objective:  BP (!) 155/86   Pulse 70   Temp 98.4 F (36.9 C) (Temporal)   Resp 20   Ht 5\' 7"  (1.702 m)   Wt 122 lb (55.3 kg)   SpO2 100%   BMI 19.11 kg/m   BP Readings from Last 3 Encounters:  02/20/19 (!) 155/86  10/01/18 135/78  06/06/18 131/80    Wt Readings from Last 3 Encounters:  02/20/19 122 lb (55.3 kg)  10/01/18 127 lb (57.6 kg)   06/06/18 127 lb (57.6 kg)     Physical Exam Vitals signs reviewed.  Constitutional:      Appearance: He is well-developed.  HENT:     Head: Normocephalic and atraumatic.     Right Ear: External ear normal.     Left Ear: External ear normal.     Mouth/Throat:     Tongue: Lesions (2 mm granuloma left lateral tongue, benign) present.     Pharynx: No oropharyngeal exudate or posterior oropharyngeal erythema.  Eyes:     Pupils: Pupils are equal, round, and reactive to light.  Neck:     Musculoskeletal: Normal range of motion and neck supple.  Cardiovascular:     Rate and Rhythm: Normal rate and regular rhythm.     Heart sounds: No murmur.  Pulmonary:     Effort: No respiratory distress.     Breath sounds: Normal breath sounds.  Neurological:     Mental Status: He is alert and oriented to person, place, and time.       Assessment & Plan:   Torence was seen today for area on tongue.  Diagnoses and all orders for this visit:  Anxiety  Pyogenic granuloma of tongue  Other orders -     buPROPion (WELLBUTRIN XL) 150 MG  24 hr tablet; Take 1 tablet (150 mg total) by mouth 2 (two) times daily.       I have discontinued [name]'s fluticasone and amoxicillin-clavulanate. I have also changed his buPROPion. Additionally, I am having him maintain his aspirin EC, latanoprost, famotidine, multivitamin with minerals, Multiple Vitamins-Minerals (EYE VITAMINS PO), and diazepam.  Allergies as of 02/20/2019      Reactions   Flagyl [metronidazole]    GI upset   Percocet [oxycodone-acetaminophen] Nausea And Vomiting   Sulfa Antibiotics Nausea Only      Medication List       Accurate as of February 20, 2019  5:55 PM. If you have any questions, ask your nurse or doctor.        STOP taking these medications   amoxicillin-clavulanate 875-125 MG tablet Commonly known as: AUGMENTIN Stopped by: [name], MD   fluticasone 50 MCG/ACT nasal spray Commonly known as:  FLONASE Stopped by: [name], MD     TAKE these medications   aspirin EC 325 MG tablet Take 81 mg by mouth daily. Patient takes sporadically   buPROPion 150 MG 24 hr tablet Commonly known as: WELLBUTRIN XL Take 1 tablet (150 mg total) by mouth 2 (two) times daily. What changed: when to take this Changed by: [name], MD   diazepam 2 MG tablet Commonly known as: VALIUM Take 1 tablet (2 mg total) by mouth every 12 (twelve) hours as needed.   EYE VITAMINS PO Take 1 tablet by mouth daily.   famotidine 20 MG tablet Commonly known as: PEPCID Take 1 tablet (20 mg total) by mouth 2 (two) times daily.   latanoprost 0.005 % ophthalmic solution Commonly known as: XALATAN   multivitamin with minerals Tabs tablet Take 1 tablet by mouth daily.      Reassured that the lesion on the tongue will resolve slowly on its own  Follow-up: Return in about 3 months (around 05/23/2019) for CPE.  [name], M.D.

## 2019-02-21 ENCOUNTER — Telehealth: Payer: Self-pay | Admitting: *Deleted

## 2019-02-21 ENCOUNTER — Other Ambulatory Visit: Payer: Self-pay | Admitting: Family Medicine

## 2019-02-21 MED ORDER — BUPROPION HCL ER (XL) 150 MG PO TB24: 150.0000 mg | ORAL_TABLET | Freq: Every day | ORAL | 1 refills | Status: DC

## 2019-02-21 NOTE — Telephone Encounter (Signed)
buPROPion HCl ER (XL) 150MG  er tablets-insurance denied because the dispense quantity exceeds MAXIMUM DAILY DOSE OF 1.0000

## 2019-03-04 ENCOUNTER — Other Ambulatory Visit: Payer: Self-pay | Admitting: Family Medicine

## 2019-03-04 ENCOUNTER — Telehealth: Payer: Self-pay | Admitting: Family Medicine

## 2019-03-04 MED ORDER — ESCITALOPRAM OXALATE 10 MG PO TABS: 10.0000 mg | ORAL_TABLET | Freq: Every day | ORAL | 1 refills | Status: DC

## 2019-03-04 NOTE — Telephone Encounter (Signed)
Patient took Wellbutrin and BP went up to 150/90. He didn't take it the next day and BP was normal. He took again the day after and again it went up to 154/92. He has stopped taking and since his BP has been 118/80 and 122/78. He doesn't want to take any more. He is wondering if Lexapro may be better? Please advise

## 2019-03-04 NOTE — Telephone Encounter (Signed)
Please contact the patient I will send in Lexapro for him.

## 2019-03-04 NOTE — Telephone Encounter (Signed)
Patient notified and verbalized understanding. 

## 2019-03-06 NOTE — Telephone Encounter (Signed)
New rx for 1 daily was sent to pharmacy 11/30 by Dr Livia Snellen, will close encounter.

## 2019-03-22 DIAGNOSIS — H401112 Primary open-angle glaucoma, right eye, moderate stage: Secondary | ICD-10-CM | POA: Diagnosis not present

## 2019-03-22 DIAGNOSIS — H40012 Open angle with borderline findings, low risk, left eye: Secondary | ICD-10-CM | POA: Diagnosis not present

## 2019-03-22 DIAGNOSIS — H02883 Meibomian gland dysfunction of right eye, unspecified eyelid: Secondary | ICD-10-CM | POA: Diagnosis not present

## 2019-03-22 DIAGNOSIS — Z9889 Other specified postprocedural states: Secondary | ICD-10-CM | POA: Diagnosis not present

## 2019-03-22 DIAGNOSIS — Z961 Presence of intraocular lens: Secondary | ICD-10-CM | POA: Diagnosis not present

## 2019-03-22 DIAGNOSIS — H10413 Chronic giant papillary conjunctivitis, bilateral: Secondary | ICD-10-CM | POA: Diagnosis not present

## 2019-03-22 DIAGNOSIS — H02886 Meibomian gland dysfunction of left eye, unspecified eyelid: Secondary | ICD-10-CM | POA: Diagnosis not present

## 2019-03-22 DIAGNOSIS — H35371 Puckering of macula, right eye: Secondary | ICD-10-CM | POA: Diagnosis not present

## 2019-04-25 ENCOUNTER — Telehealth: Payer: Self-pay | Admitting: Family Medicine

## 2019-04-25 ENCOUNTER — Other Ambulatory Visit: Payer: Self-pay | Admitting: *Deleted

## 2019-04-25 NOTE — Telephone Encounter (Signed)
Okay. No problem to stop it. Does he need soomething else now for anxiety? If not, we can review at his physical (due next month) Thanks, WS

## 2019-04-25 NOTE — Telephone Encounter (Signed)
Can review at next physical.  He did not want a new medicine.

## 2019-04-25 NOTE — Telephone Encounter (Signed)
Please review

## 2019-04-25 NOTE — Telephone Encounter (Signed)
Please advise if this is a side effect of medication and if a change is warranted.

## 2019-04-25 NOTE — Telephone Encounter (Signed)
What kind of readings is he getting? WS

## 2019-04-25 NOTE — Telephone Encounter (Signed)
Patient had readings from systolic [id number] to Q000111Q and diastolic 84 to 90.  He usually is around 118/76 or close.  He decide to stop the lexapro because he is sure it caused this change.  He requested medicine be removed from his list, (done).

## 2019-04-30 ENCOUNTER — Ambulatory Visit: Payer: Medicare HMO

## 2019-05-06 ENCOUNTER — Encounter (INDEPENDENT_AMBULATORY_CARE_PROVIDER_SITE_OTHER): Payer: Medicare HMO | Admitting: Ophthalmology

## 2019-05-09 ENCOUNTER — Ambulatory Visit: Payer: Medicare HMO | Attending: Internal Medicine

## 2019-05-09 DIAGNOSIS — Z23 Encounter for immunization: Secondary | ICD-10-CM | POA: Insufficient documentation

## 2019-05-09 NOTE — Progress Notes (Signed)
   [id number]  Name:  Wesley Fowler.    MRN: [id number] DOB: 12-26-1945  05/09/2019  [name] was observed post Covid-19 immunization for 15 minutes without incidence. He was provided with Vaccine Information Sheet and instruction to access the V-Safe system.   [name] was instructed to call 911 with any severe reactions post vaccine: Marland Kitchen Difficulty breathing  . Swelling of your face and throat  . A fast heartbeat  . A bad rash all over your body  . Dizziness and weakness    Immunizations Administered    Name Date Dose VIS Date Route   Pfizer COVID-19 Vaccine 05/09/2019  9:08 AM 0.3 mL 03/15/2019 Intramuscular   Manufacturer: [place]   Lot: [id number]   [place]: [id number]

## 2019-05-17 ENCOUNTER — Ambulatory Visit: Payer: Medicare HMO

## 2019-06-03 ENCOUNTER — Ambulatory Visit: Payer: Medicare HMO | Attending: Internal Medicine

## 2019-06-03 DIAGNOSIS — Z23 Encounter for immunization: Secondary | ICD-10-CM | POA: Insufficient documentation

## 2019-06-03 NOTE — Progress Notes (Signed)
   [id number]  Name:  Marquay Coulon.    MRN: [id number] DOB: 02-12-1946  06/03/2019  Mr. Jasin was observed post Covid-19 immunization for 15 minutes without incidence. He was provided with Vaccine Information Sheet and instruction to access the V-Safe system.   Mr. Barmes was instructed to call 911 with any severe reactions post vaccine: Marland Kitchen Difficulty breathing  . Swelling of your face and throat  . A fast heartbeat  . A bad rash all over your body  . Dizziness and weakness    Immunizations Administered    Name Date Dose VIS Date Route   Pfizer COVID-19 Vaccine 06/03/2019  1:29 PM 0.3 mL 03/15/2019 Intramuscular   Manufacturer: [place]   Lot: [id number]   [place]: [id number]

## 2019-06-07 ENCOUNTER — Encounter (INDEPENDENT_AMBULATORY_CARE_PROVIDER_SITE_OTHER): Payer: Medicare HMO | Admitting: Ophthalmology

## 2019-06-07 DIAGNOSIS — H43813 Vitreous degeneration, bilateral: Secondary | ICD-10-CM

## 2019-06-07 DIAGNOSIS — H353121 Nonexudative age-related macular degeneration, left eye, early dry stage: Secondary | ICD-10-CM

## 2019-06-07 DIAGNOSIS — H35371 Puckering of macula, right eye: Secondary | ICD-10-CM

## 2019-06-07 DIAGNOSIS — H353112 Nonexudative age-related macular degeneration, right eye, intermediate dry stage: Secondary | ICD-10-CM | POA: Diagnosis not present

## 2019-06-07 DIAGNOSIS — H338 Other retinal detachments: Secondary | ICD-10-CM | POA: Diagnosis not present

## 2019-06-23 DIAGNOSIS — N400 Enlarged prostate without lower urinary tract symptoms: Secondary | ICD-10-CM | POA: Diagnosis not present

## 2019-06-23 DIAGNOSIS — I10 Essential (primary) hypertension: Secondary | ICD-10-CM | POA: Diagnosis not present

## 2019-06-25 ENCOUNTER — Other Ambulatory Visit: Payer: Self-pay | Admitting: *Deleted

## 2019-06-25 ENCOUNTER — Other Ambulatory Visit: Payer: Medicare HMO

## 2019-06-25 ENCOUNTER — Other Ambulatory Visit: Payer: Self-pay

## 2019-06-25 DIAGNOSIS — Z0000 Encounter for general adult medical examination without abnormal findings: Secondary | ICD-10-CM

## 2019-06-25 DIAGNOSIS — N400 Enlarged prostate without lower urinary tract symptoms: Secondary | ICD-10-CM | POA: Diagnosis not present

## 2019-06-25 DIAGNOSIS — I10 Essential (primary) hypertension: Secondary | ICD-10-CM

## 2019-06-25 DIAGNOSIS — E559 Vitamin D deficiency, unspecified: Secondary | ICD-10-CM

## 2019-06-25 LAB — URINALYSIS
Bilirubin, UA: NEGATIVE
Glucose, UA: NEGATIVE
Ketones, UA: NEGATIVE
Leukocytes,UA: NEGATIVE
Nitrite, UA: NEGATIVE
Protein,UA: NEGATIVE
RBC, UA: NEGATIVE
Specific Gravity, UA: 1.01 (ref 1.005–1.030)
Urobilinogen, Ur: 0.2 mg/dL (ref 0.2–1.0)
pH, UA: 7 (ref 5.0–7.5)

## 2019-06-26 LAB — CBC WITH DIFFERENTIAL/PLATELET
Basophils Absolute: 0.1 10*3/uL (ref 0.0–0.2)
Basos: 1 %
EOS (ABSOLUTE): 0.1 10*3/uL (ref 0.0–0.4)
Eos: 3 %
Hematocrit: 38.4 % (ref 37.5–51.0)
Hemoglobin: 13.7 g/dL (ref 13.0–17.7)
Immature Grans (Abs): 0 10*3/uL (ref 0.0–0.1)
Immature Granulocytes: 0 %
Lymphocytes Absolute: 1.2 10*3/uL (ref 0.7–3.1)
Lymphs: 25 %
MCH: 32.8 pg (ref 26.6–33.0)
MCHC: 35.7 g/dL (ref 31.5–35.7)
MCV: 92 fL (ref 79–97)
Monocytes Absolute: 0.5 10*3/uL (ref 0.1–0.9)
Monocytes: 10 %
Neutrophils Absolute: 2.9 10*3/uL (ref 1.4–7.0)
Neutrophils: 61 %
Platelets: 275 10*3/uL (ref 150–450)
RBC: 4.18 x10E6/uL (ref 4.14–5.80)
RDW: 11.6 % (ref 11.6–15.4)
WBC: 4.9 10*3/uL (ref 3.4–10.8)

## 2019-06-26 LAB — CMP14+EGFR
ALT: 19 IU/L (ref 0–44)
AST: 23 IU/L (ref 0–40)
Albumin/Globulin Ratio: 2.4 — ABNORMAL HIGH (ref 1.2–2.2)
Albumin: 4.5 g/dL (ref 3.7–4.7)
Alkaline Phosphatase: 131 IU/L — ABNORMAL HIGH (ref 39–117)
BUN/Creatinine Ratio: 9 — ABNORMAL LOW (ref 10–24)
BUN: 7 mg/dL — ABNORMAL LOW (ref 8–27)
Bilirubin Total: 0.5 mg/dL (ref 0.0–1.2)
CO2: 23 mmol/L (ref 20–29)
Calcium: 9.3 mg/dL (ref 8.6–10.2)
Chloride: 95 mmol/L — ABNORMAL LOW (ref 96–106)
Creatinine, Ser: 0.8 mg/dL (ref 0.76–1.27)
GFR calc Af Amer: 104 mL/min/{1.73_m2} (ref >59)
GFR calc non Af Amer: 90 mL/min/{1.73_m2} (ref >59)
Globulin, Total: 1.9 g/dL (ref 1.5–4.5)
Glucose: 91 mg/dL (ref 65–99)
Potassium: 4.7 mmol/L (ref 3.5–5.2)
Sodium: 131 mmol/L — ABNORMAL LOW (ref 134–144)
Total Protein: 6.4 g/dL (ref 6.0–8.5)

## 2019-06-26 LAB — PSA, TOTAL AND FREE
PSA, Free Pct: 22 %
PSA, Free: 0.22 ng/mL
Prostate Specific Ag, Serum: 1 ng/mL (ref 0.0–4.0)

## 2019-06-26 LAB — LIPID PANEL
Chol/HDL Ratio: 2.2 ratio (ref 0.0–5.0)
Cholesterol, Total: 203 mg/dL — ABNORMAL HIGH (ref 100–199)
HDL: 91 mg/dL (ref >39)
LDL Chol Calc (NIH): 104 mg/dL — ABNORMAL HIGH (ref 0–99)
Triglycerides: 42 mg/dL (ref 0–149)
VLDL Cholesterol Cal: 8 mg/dL (ref 5–40)

## 2019-06-26 LAB — VITAMIN D 25 HYDROXY (VIT D DEFICIENCY, FRACTURES): Vit D, 25-Hydroxy: 38.9 ng/mL (ref 30.0–100.0)

## 2019-07-15 ENCOUNTER — Ambulatory Visit: Payer: Medicare HMO | Admitting: Family Medicine

## 2019-07-23 DIAGNOSIS — H10413 Chronic giant papillary conjunctivitis, bilateral: Secondary | ICD-10-CM | POA: Diagnosis not present

## 2019-07-23 DIAGNOSIS — H35371 Puckering of macula, right eye: Secondary | ICD-10-CM | POA: Diagnosis not present

## 2019-07-23 DIAGNOSIS — Z9889 Other specified postprocedural states: Secondary | ICD-10-CM | POA: Diagnosis not present

## 2019-07-23 DIAGNOSIS — H401112 Primary open-angle glaucoma, right eye, moderate stage: Secondary | ICD-10-CM | POA: Diagnosis not present

## 2019-07-23 DIAGNOSIS — H0288A Meibomian gland dysfunction right eye, upper and lower eyelids: Secondary | ICD-10-CM | POA: Diagnosis not present

## 2019-07-23 DIAGNOSIS — H40012 Open angle with borderline findings, low risk, left eye: Secondary | ICD-10-CM | POA: Diagnosis not present

## 2019-07-23 DIAGNOSIS — Z961 Presence of intraocular lens: Secondary | ICD-10-CM | POA: Diagnosis not present

## 2019-07-23 DIAGNOSIS — H0288B Meibomian gland dysfunction left eye, upper and lower eyelids: Secondary | ICD-10-CM | POA: Diagnosis not present

## 2019-08-01 ENCOUNTER — Other Ambulatory Visit: Payer: Self-pay

## 2019-08-01 ENCOUNTER — Telehealth: Payer: Self-pay | Admitting: Family Medicine

## 2019-08-01 ENCOUNTER — Telehealth: Payer: Self-pay | Admitting: *Deleted

## 2019-08-01 ENCOUNTER — Encounter: Payer: Self-pay | Admitting: Family Medicine

## 2019-08-01 ENCOUNTER — Ambulatory Visit (INDEPENDENT_AMBULATORY_CARE_PROVIDER_SITE_OTHER): Payer: Medicare HMO | Admitting: Family Medicine

## 2019-08-01 ENCOUNTER — Ambulatory Visit: Payer: Medicare HMO | Admitting: Family Medicine

## 2019-08-01 ENCOUNTER — Other Ambulatory Visit: Payer: Self-pay | Admitting: Family Medicine

## 2019-08-01 VITALS — BP 136/84 | HR 73 | Temp 97.6°F | Resp 20 | Ht 67.0 in | Wt 125.2 lb

## 2019-08-01 DIAGNOSIS — F1320 Sedative, hypnotic or anxiolytic dependence, uncomplicated: Secondary | ICD-10-CM

## 2019-08-01 DIAGNOSIS — Z125 Encounter for screening for malignant neoplasm of prostate: Secondary | ICD-10-CM | POA: Diagnosis not present

## 2019-08-01 DIAGNOSIS — E559 Vitamin D deficiency, unspecified: Secondary | ICD-10-CM

## 2019-08-01 DIAGNOSIS — Z79899 Other long term (current) drug therapy: Secondary | ICD-10-CM

## 2019-08-01 DIAGNOSIS — Z0001 Encounter for general adult medical examination with abnormal findings: Secondary | ICD-10-CM

## 2019-08-01 DIAGNOSIS — I10 Essential (primary) hypertension: Secondary | ICD-10-CM

## 2019-08-01 DIAGNOSIS — Z0000 Encounter for general adult medical examination without abnormal findings: Secondary | ICD-10-CM

## 2019-08-01 DIAGNOSIS — R69 Illness, unspecified: Secondary | ICD-10-CM | POA: Diagnosis not present

## 2019-08-01 DIAGNOSIS — K219 Gastro-esophageal reflux disease without esophagitis: Secondary | ICD-10-CM

## 2019-08-01 DIAGNOSIS — F4001 Agoraphobia with panic disorder: Secondary | ICD-10-CM

## 2019-08-01 LAB — URINALYSIS
Bilirubin, UA: NEGATIVE
Glucose, UA: NEGATIVE
Ketones, UA: NEGATIVE
Leukocytes,UA: NEGATIVE
Nitrite, UA: NEGATIVE
Protein,UA: NEGATIVE
RBC, UA: NEGATIVE
Specific Gravity, UA: 1.01 (ref 1.005–1.030)
Urobilinogen, Ur: 0.2 mg/dL (ref 0.2–1.0)
pH, UA: 7 (ref 5.0–7.5)

## 2019-08-01 MED ORDER — FAMOTIDINE 20 MG PO TABS: 20.0000 mg | ORAL_TABLET | Freq: Two times a day (BID) | ORAL | 11 refills | Status: DC

## 2019-08-01 MED ORDER — MECLIZINE HCL 25 MG PO TABS: 25.0000 mg | ORAL_TABLET | Freq: Three times a day (TID) | ORAL | 10 refills | Status: DC | PRN

## 2019-08-01 MED ORDER — DIAZEPAM 2 MG PO TABS: 2.0000 mg | ORAL_TABLET | Freq: Two times a day (BID) | ORAL | 1 refills | Status: DC | PRN

## 2019-08-01 NOTE — Progress Notes (Signed)
Subjective:  Patient ID: Wesley Dolores., male    DOB: 08-25-1945  Age: 74 y.o. MRN: [id number]  CC: No chief complaint on file.   HPI Wesley Fowler. presents for complete physical. HE has concern for occasional panic which responds to valium. He only takes 2-4 a month.   Patient in for follow-up of GERD. Currently asymptomatic taking  Pepcid daily. There is no chest pain or heartburn. No hematemesis and no melena. No dysphagia or choking. Onset is remote. Progression is stable. Complicating factors, none.  GAD 7 : Generalized Anxiety Score 08/01/2019  Nervous, Anxious, on Edge 1  Control/stop worrying 0  Worry too much - different things 0  Trouble relaxing 0  Restless 0  Easily annoyed or irritable 0  Afraid - awful might happen 0  Total GAD 7 Score 1  Anxiety Difficulty Not difficult at all      Depression screen [facility] 2/9 08/01/2019 02/20/2019 10/01/2018  Decreased Interest 0 0 0  Down, Depressed, Hopeless 0 0 0  PHQ - 2 Score 0 0 0  Altered sleeping - - -  Tired, decreased energy - - -  Change in appetite - - -  Feeling bad or failure about yourself  - - -  Trouble concentrating - - -  Moving slowly or fidgety/restless - - -  Suicidal thoughts - - -  PHQ-9 Score - - -  Difficult doing work/chores - - -    History Wesley Fowler has a past medical history of Anxiety, Arthritis, Glaucoma, and History of high blood pressure.   He has a past surgical history that includes Retinal detachment surgery (2004); Knee arthroscopy (2004); and Laparoscopic inguinal hernia repair (03/15/11).   His family history includes Lung cancer in his maternal grandfather.He reports that he has quit smoking. He has never used smokeless tobacco. He reports that he does not drink alcohol or use drugs.    ROS Review of Systems  Constitutional: Negative for activity change, fatigue and unexpected weight change.  HENT: Negative for congestion, ear pain, hearing loss, postnasal drip and trouble  swallowing.   Eyes: Negative for pain and visual disturbance.  Respiratory: Negative for cough, chest tightness and shortness of breath.   Cardiovascular: Negative for chest pain, palpitations and leg swelling.  Gastrointestinal: Negative for abdominal distention, abdominal pain, blood in stool, constipation, diarrhea, nausea and vomiting.  Endocrine: Negative for cold intolerance, heat intolerance and polydipsia.  Genitourinary: Negative for difficulty urinating, dysuria, flank pain, frequency and urgency.  Musculoskeletal: Negative for arthralgias and joint swelling.  Skin: Negative for color change, rash and wound.  Neurological: Negative for dizziness, syncope, speech difficulty, weakness, light-headedness, numbness and headaches.  Hematological: Does not bruise/bleed easily.  Psychiatric/Behavioral: Positive for agitation (rare, associated with panic). Negative for confusion, decreased concentration, dysphoric mood and sleep disturbance.    Objective:  BP 136/84   Pulse 73   Temp 97.6 F (36.4 C) (Temporal)   Resp 20   Ht 5' 7"  (1.702 m)   Wt 125 lb 4 oz (56.8 kg)   SpO2 100%   BMI 19.62 kg/m   BP Readings from Last 3 Encounters:  08/01/19 136/84  02/20/19 (!) 155/86  10/01/18 135/78    Wt Readings from Last 3 Encounters:  08/01/19 125 lb 4 oz (56.8 kg)  02/20/19 122 lb (55.3 kg)  10/01/18 127 lb (57.6 kg)     Physical Exam Constitutional:      Appearance: He is well-developed.  HENT:  Head: Normocephalic and atraumatic.  Eyes:     Pupils: Pupils are equal, round, and reactive to light.  Neck:     Thyroid: No thyromegaly.     Trachea: No tracheal deviation.  Cardiovascular:     Rate and Rhythm: Normal rate and regular rhythm.     Heart sounds: Normal heart sounds. No murmur. No friction rub. No gallop.   Pulmonary:     Breath sounds: Normal breath sounds. No wheezing or rales.  Abdominal:     General: Bowel sounds are normal. There is no distension.      Palpations: Abdomen is soft. There is no mass.     Tenderness: There is no abdominal tenderness.     Hernia: There is no hernia in the left inguinal area.  Genitourinary:    Penis: Normal.      Testes: Normal.  Musculoskeletal:        General: Normal range of motion.     Cervical back: Normal range of motion.  Lymphadenopathy:     Cervical: No cervical adenopathy.  Skin:    General: Skin is warm and dry.  Neurological:     Mental Status: He is alert and oriented to person, place, and time.       Assessment & Plan:   Diagnoses and all orders for this visit:  Controlled substance agreement signed -     Cancel: ToxASSURE Select 13 (MW), Urine -     ToxASSURE Select 13 (MW), Urine  Benzodiazepine dependence ([place]) -     ToxASSURE Select 13 (MW), Urine  Well adult exam  Essential hypertension -     CBC with Differential/Platelet -     CMP14+EGFR -     Lipid panel -     Urinalysis  Agoraphobia with panic attacks -     ToxASSURE Select 13 (MW), Urine  Screening for prostate cancer -     PSA Total (Reflex To Free)  Vitamin D deficiency -     VITAMIN D 25 Hydroxy (Vit-D Deficiency, Fractures)  Gastroesophageal reflux disease without esophagitis -     Discontinue: famotidine (PEPCID) 20 MG tablet; Take 1 tablet (20 mg total) by mouth 2 (two) times daily.  Other orders -     diazepam (VALIUM) 2 MG tablet; Take 1 tablet (2 mg total) by mouth every 12 (twelve) hours as needed for anxiety. -     Discontinue: meclizine (ANTIVERT) 25 MG tablet; Take 1 tablet (25 mg total) by mouth 3 (three) times daily as needed for dizziness.       I have discontinued [name]'s famotidine and meclizine. I have also changed his diazepam. Additionally, I am having him maintain his aspirin EC, latanoprost, multivitamin with minerals, and Multiple Vitamins-Minerals (EYE VITAMINS PO).  Allergies as of 08/01/2019      Reactions   Flagyl [metronidazole]    GI upset   Percocet  [oxycodone-acetaminophen] Nausea And Vomiting   Sulfa Antibiotics Nausea Only      Medication List       Accurate as of August 01, 2019 11:59 PM. If you have any questions, ask your nurse or doctor.        aspirin EC 325 MG tablet Take 81 mg by mouth daily. Patient takes sporadically   diazepam 2 MG tablet Commonly known as: VALIUM Take 1 tablet (2 mg total) by mouth every 12 (twelve) hours as needed for anxiety. What changed: reasons to take this Changed by: [name], MD   EYE  VITAMINS PO Take 1 tablet by mouth daily.   famotidine 20 MG tablet Commonly known as: PEPCID Take 1 tablet by mouth twice daily   latanoprost 0.005 % ophthalmic solution Commonly known as: XALATAN   meclizine 25 MG tablet Commonly known as: ANTIVERT TAKE 1 TABLET BY MOUTH THREE TIMES DAILY AS NEEDED FOR DIZZINESS What changed: See the new instructions. Changed by: Redge Gainer, MD   multivitamin with minerals Tabs tablet Take 1 tablet by mouth daily.        Follow-up: Return in about 6 months (around 01/31/2020).  [name], M.D.

## 2019-08-01 NOTE — Telephone Encounter (Signed)
PA for diazePAM 2MG  tablets- IN PROCESS  [facility]) PA Case ID: [id number]  Your information has been submitted to [place] Medicare Part D. [employer] will review the request and will issue a decision, typically within 1-3 days from your submission. You can check the updated outcome later by reopening this request.  If [employer] has not responded in 1-3 days or if you have any questions about your ePA request, please contact [place] Medicare Part D at [phone]. If you think there may be a problem with your PA request, use our live chat feature at the bottom right.

## 2019-08-01 NOTE — Telephone Encounter (Signed)
Pt had apt today and medications have not been called in --please send to [facility] in [place]. meclizine (ANTIVERT) 25 MG tablet famotidine (PEPCID) 20 MG tablet diazepam (VALIUM) 2 MG tablet

## 2019-08-01 NOTE — Telephone Encounter (Signed)
Diazepam was sent this morning. Report shows phaarmacy confirmed receipt at 10:32. The other two were sent just now. WS

## 2019-08-01 NOTE — Telephone Encounter (Signed)
Patient aware and verbalized understanding. °

## 2019-08-02 NOTE — Telephone Encounter (Signed)
PA-approved 04/05/19-10/30/19 pharmacy and patient aware.

## 2019-08-05 ENCOUNTER — Encounter: Payer: Self-pay | Admitting: Family Medicine

## 2019-08-05 LAB — TOXASSURE SELECT 13 (MW), URINE

## 2019-08-06 ENCOUNTER — Telehealth: Payer: Self-pay

## 2019-08-06 DIAGNOSIS — Z79899 Other long term (current) drug therapy: Secondary | ICD-10-CM

## 2019-08-06 NOTE — Telephone Encounter (Signed)
The toxassure. WS

## 2019-08-06 NOTE — Telephone Encounter (Signed)
Patient aware, he will come by in the next couple of days and leave another urine.

## 2019-08-06 NOTE — Telephone Encounter (Signed)
Patient has a question and is not sure which test you want him to repeat.  His Toxassure came back with a low creatinine level but you said his CMP showed a normal level.  Do you want the patient to repeat the Toxassure or repeat a CMP to recheck his level?

## 2019-08-07 ENCOUNTER — Other Ambulatory Visit: Payer: Medicare HMO

## 2019-08-07 ENCOUNTER — Other Ambulatory Visit: Payer: Self-pay

## 2019-08-07 DIAGNOSIS — Z79899 Other long term (current) drug therapy: Secondary | ICD-10-CM | POA: Diagnosis not present

## 2019-08-09 LAB — TOXASSURE SELECT 13 (MW), URINE

## 2019-08-12 ENCOUNTER — Telehealth: Payer: Self-pay | Admitting: *Deleted

## 2019-08-12 NOTE — Telephone Encounter (Signed)
PATIENT REQUESTING RESULTS FROM FOLLOW UP DRUG SCREEN

## 2019-08-13 ENCOUNTER — Telehealth: Payer: Self-pay | Admitting: *Deleted

## 2019-08-13 NOTE — Telephone Encounter (Signed)
It came back normal.

## 2019-08-13 NOTE — Telephone Encounter (Signed)
Aware of result. 

## 2019-08-13 NOTE — Telephone Encounter (Signed)
Requesting results on last drug screen .

## 2019-09-25 DIAGNOSIS — L819 Disorder of pigmentation, unspecified: Secondary | ICD-10-CM | POA: Diagnosis not present

## 2019-09-25 DIAGNOSIS — L814 Other melanin hyperpigmentation: Secondary | ICD-10-CM | POA: Diagnosis not present

## 2019-09-25 DIAGNOSIS — L821 Other seborrheic keratosis: Secondary | ICD-10-CM | POA: Diagnosis not present

## 2019-09-25 DIAGNOSIS — D229 Melanocytic nevi, unspecified: Secondary | ICD-10-CM | POA: Diagnosis not present

## 2019-09-25 DIAGNOSIS — L570 Actinic keratosis: Secondary | ICD-10-CM | POA: Diagnosis not present

## 2019-11-25 DIAGNOSIS — Z961 Presence of intraocular lens: Secondary | ICD-10-CM | POA: Diagnosis not present

## 2019-11-25 DIAGNOSIS — H10413 Chronic giant papillary conjunctivitis, bilateral: Secondary | ICD-10-CM | POA: Diagnosis not present

## 2019-11-25 DIAGNOSIS — H40012 Open angle with borderline findings, low risk, left eye: Secondary | ICD-10-CM | POA: Diagnosis not present

## 2019-11-25 DIAGNOSIS — H0288A Meibomian gland dysfunction right eye, upper and lower eyelids: Secondary | ICD-10-CM | POA: Diagnosis not present

## 2019-11-25 DIAGNOSIS — H401112 Primary open-angle glaucoma, right eye, moderate stage: Secondary | ICD-10-CM | POA: Diagnosis not present

## 2019-11-25 DIAGNOSIS — H35371 Puckering of macula, right eye: Secondary | ICD-10-CM | POA: Diagnosis not present

## 2019-11-25 DIAGNOSIS — H0288B Meibomian gland dysfunction left eye, upper and lower eyelids: Secondary | ICD-10-CM | POA: Diagnosis not present

## 2019-11-25 DIAGNOSIS — Z9889 Other specified postprocedural states: Secondary | ICD-10-CM | POA: Diagnosis not present

## 2019-11-28 ENCOUNTER — Ambulatory Visit (INDEPENDENT_AMBULATORY_CARE_PROVIDER_SITE_OTHER): Payer: Medicare HMO | Admitting: Family Medicine

## 2019-11-28 ENCOUNTER — Other Ambulatory Visit: Payer: Self-pay

## 2019-11-28 ENCOUNTER — Encounter: Payer: Self-pay | Admitting: Family Medicine

## 2019-11-28 VITALS — BP 147/84 | HR 73 | Temp 97.6°F | Ht 67.0 in | Wt 125.2 lb

## 2019-11-28 DIAGNOSIS — L03113 Cellulitis of right upper limb: Secondary | ICD-10-CM | POA: Diagnosis not present

## 2019-11-28 MED ORDER — CEPHALEXIN 500 MG PO CAPS: 500.0000 mg | ORAL_CAPSULE | Freq: Two times a day (BID) | ORAL | 0 refills | Status: AC

## 2019-11-28 NOTE — Progress Notes (Signed)
Assessment & Plan:  1. Cellulitis of right upper extremity - Education provided on wound care. Patient is up-to-date with TDAP.  - cephALEXin (KEFLEX) 500 MG capsule; Take 1 capsule (500 mg total) by mouth 2 (two) times daily for 7 days.  Dispense: 14 capsule; Refill: 0   Return if symptoms worsen or fail to improve.  Hendricks Limes, MSN, APRN, FNP-C [facility]  Subjective:    Patient ID: Wesley Mixson., male    DOB: 02/26/1946, 74 y.o.   MRN: [id number]  Patient Care Team: [name], MD as PCP - General (Family Medicine)   Chief Complaint:  Chief Complaint  Patient presents with  . Arm Swelling    Patient states that yesterday he stuck his right lower arm with a wire  . spot on tounge    x 2 weeks after brushing with a hard tooth brush    HPI: Wesley Fowler. is a 74 y.o. male presenting on 11/28/2019 for Arm Swelling (Patient states that yesterday he stuck his right lower arm with a wire) and spot on tounge (x 2 weeks after brushing with a hard tooth brush)  Patient reports he stuck his right forearm with a fence wire yesterday while he was outside working. He is worried due to the swelling, redness, and warmth.   He also has a spot on his tongue that "looks different" after brushing with a hard bristle tooth brush two weeks ago. It does not hurt or feel irritated.    Social history:  Relevant past medical, surgical, family and social history reviewed and updated as indicated. Interim medical history since our last visit reviewed.  Allergies and medications reviewed and updated.  DATA REVIEWED: CHART IN EPIC  ROS: Negative unless specifically indicated above in HPI.    Current Outpatient Medications:  .  aspirin EC 325 MG tablet, Take 81 mg by mouth daily. Patient takes sporadically, Disp: , Rfl:  .  famotidine (PEPCID) 20 MG tablet, Take 1 tablet by mouth twice daily, Disp: 180 tablet, Rfl: 3 .  latanoprost (XALATAN) 0.005 %  ophthalmic solution, , Disp: , Rfl:  .  meclizine (ANTIVERT) 25 MG tablet, TAKE 1 TABLET BY MOUTH THREE TIMES DAILY AS NEEDED FOR DIZZINESS, Disp: 90 tablet, Rfl: 0 .  Multiple Vitamin (MULTIVITAMIN WITH MINERALS) TABS tablet, Take 1 tablet by mouth daily., Disp: , Rfl:  .  Multiple Vitamins-Minerals (EYE VITAMINS PO), Take 1 tablet by mouth daily., Disp: , Rfl:    Allergies  Allergen Reactions  . Flagyl [Metronidazole]     GI upset  . Percocet [Oxycodone-Acetaminophen] Nausea And Vomiting  . Sulfa Antibiotics Nausea Only   Past Medical History:  Diagnosis Date  . Anxiety   . Arthritis   . Glaucoma   . History of high blood pressure     Past Surgical History:  Procedure Laterality Date  . KNEE ARTHROSCOPY  2004   right  . LAPAROSCOPIC INGUINAL HERNIA REPAIR  03/15/11   bilateral  . RETINAL DETACHMENT SURGERY  2004   right    Social History   Socioeconomic History  . Marital status: Married    Spouse name: Not on file  . Number of children: Not on file  . Years of education: Not on file  . Highest education level: Not on file  Occupational History  . Not on file  Tobacco Use  . Smoking status: Former Research scientist (life sciences)  . Smokeless tobacco: Never Used  . Tobacco comment: quit 1982  Vaping Use  . Vaping Use: Never used  Substance and Sexual Activity  . Alcohol use: No  . Drug use: No  . Sexual activity: Never  Other Topics Concern  . Not on file  Social History Narrative  . Not on file   Social Determinants of Health   Financial Resource Strain:   . Difficulty of Paying Living Expenses: Not on file  Food Insecurity:   . Worried About Charity fundraiser in the Last Year: Not on file  . Ran Out of Food in the Last Year: Not on file  Transportation Needs:   . Lack of Transportation (Medical): Not on file  . Lack of Transportation (Non-Medical): Not on file  Physical Activity:   . Days of Exercise per Week: Not on file  . Minutes of Exercise per Session: Not on file    Stress:   . Feeling of Stress : Not on file  Social Connections:   . Frequency of Communication with Friends and Family: Not on file  . Frequency of Social Gatherings with Friends and Family: Not on file  . Attends Religious Services: Not on file  . Active Member of Clubs or Organizations: Not on file  . Attends Archivist Meetings: Not on file  . Marital Status: Not on file  Intimate Partner Violence:   . Fear of Current or Ex-Partner: Not on file  . Emotionally Abused: Not on file  . Physically Abused: Not on file  . Sexually Abused: Not on file        Objective:    BP (!) 147/84   Pulse 73   Temp 97.6 F (36.4 C) (Temporal)   Ht 5\' 7"  (1.702 m)   Wt 125 lb 3.2 oz (56.8 kg)   SpO2 97%   BMI 19.61 kg/m   Wt Readings from Last 3 Encounters:  11/28/19 125 lb 3.2 oz (56.8 kg)  08/01/19 125 lb 4 oz (56.8 kg)  02/20/19 122 lb (55.3 kg)    Physical Exam Vitals reviewed.  Constitutional:      General: He is not in acute distress.    Appearance: Normal appearance. He is not ill-appearing, toxic-appearing or diaphoretic.  HENT:     Head: Normocephalic and atraumatic.     Mouth/Throat:     Comments: Spot of concern on patient's tongue looks normal and healthy.  Eyes:     General: No scleral icterus.       Right eye: No discharge.        Left eye: No discharge.     Conjunctiva/sclera: Conjunctivae normal.  Cardiovascular:     Rate and Rhythm: Normal rate.  Pulmonary:     Effort: Pulmonary effort is normal. No respiratory distress.  Musculoskeletal:        General: Normal range of motion.     Cervical back: Normal range of motion.  Skin:    General: Skin is warm and dry.     Findings: Erythema (right forearm with swelling and warmth) present.  Neurological:     Mental Status: He is alert and oriented to person, place, and time. Mental status is at baseline.  Psychiatric:        Mood and Affect: Mood normal.        Behavior: Behavior normal.         Thought Content: Thought content normal.        Judgment: Judgment normal.     Lab Results  Component Value Date   TSH  0.98 12/24/2015   Lab Results  Component Value Date   WBC 4.9 06/25/2019   HGB 13.7 06/25/2019   HCT 38.4 06/25/2019   MCV 92 06/25/2019   PLT 275 06/25/2019   Lab Results  Component Value Date   NA 131 (L) 06/25/2019   K 4.7 06/25/2019   CO2 23 06/25/2019   GLUCOSE 91 06/25/2019   BUN 7 (L) 06/25/2019   CREATININE 0.80 06/25/2019   BILITOT 0.5 06/25/2019   ALKPHOS 131 (H) 06/25/2019   AST 23 06/25/2019   ALT 19 06/25/2019   PROT 6.4 06/25/2019   ALBUMIN 4.5 06/25/2019   CALCIUM 9.3 06/25/2019   Lab Results  Component Value Date   CHOL 203 (H) 06/25/2019   Lab Results  Component Value Date   HDL 91 06/25/2019   Lab Results  Component Value Date   LDLCALC 104 (H) 06/25/2019   Lab Results  Component Value Date   TRIG 42 06/25/2019   Lab Results  Component Value Date   CHOLHDL 2.2 06/25/2019   Lab Results  Component Value Date   HGBA1C 4.5 11/13/2012

## 2019-11-28 NOTE — Patient Instructions (Signed)
Wound Care, Adult Taking care of your wound properly can help to prevent pain, infection, and scarring. It can also help your wound to heal more quickly. How to care for your wound Wound care      Follow instructions from your health care provider about how to take care of your wound. Make sure you: ? Wash your hands with soap and water before you change the bandage (dressing). If soap and water are not available, use hand sanitizer. ? Change your dressing as told by your health care provider. ? Leave stitches (sutures), skin glue, or adhesive strips in place. These skin closures may need to stay in place for 2 weeks or longer. If adhesive strip edges start to loosen and curl up, you may trim the loose edges. Do not remove adhesive strips completely unless your health care provider tells you to do that.  Check your wound area every day for signs of infection. Check for: ? Redness, swelling, or pain. ? Fluid or blood. ? Warmth. ? Pus or a bad smell.  Ask your health care provider if you should clean the wound with mild soap and water. Doing this may include: ? Using a clean towel to pat the wound dry after cleaning it. Do not rub or scrub the wound. ? Applying a cream or ointment. Do this only as told by your health care provider. ? Covering the incision with a clean dressing.  Ask your health care provider when you can leave the wound uncovered.  Keep the dressing dry until your health care provider says it can be removed. Do not take baths, swim, use a hot tub, or do anything that would put the wound underwater until your health care provider approves. Ask your health care provider if you can take showers. You may only be allowed to take sponge baths. Medicines   If you were prescribed an antibiotic medicine, cream, or ointment, take or use the antibiotic as told by your health care provider. Do not stop taking or using the antibiotic even if your condition improves.  Take  over-the-counter and prescription medicines only as told by your health care provider. If you were prescribed pain medicine, take it 30 or more minutes before you do any wound care or as told by your health care provider. General instructions  Return to your normal activities as told by your health care provider. Ask your health care provider what activities are safe.  Do not scratch or pick at the wound.  Do not use any products that contain nicotine or tobacco, such as cigarettes and e-cigarettes. These may delay wound healing. If you need help quitting, ask your health care provider.  Keep all follow-up visits as told by your health care provider. This is important.  Eat a diet that includes protein, vitamin A, vitamin C, and other nutrient-rich foods to help the wound heal. ? Foods rich in protein include meat, dairy, beans, nuts, and other sources. ? Foods rich in vitamin A include carrots and dark green, leafy vegetables. ? Foods rich in vitamin C include citrus, tomatoes, and other fruits and vegetables. ? Nutrient-rich foods have protein, carbohydrates, fat, vitamins, or minerals. Eat a variety of healthy foods including vegetables, fruits, and whole grains. Contact a health care provider if:  You received a tetanus shot and you have swelling, severe pain, redness, or bleeding at the injection site.  Your pain is not controlled with medicine.  You have redness, swelling, or pain around the wound.    You have fluid or blood coming from the wound.  Your wound feels warm to the touch.  You have pus or a bad smell coming from the wound.  You have a fever or chills.  You are nauseous or you vomit.  You are dizzy. Get help right away if:  You have a red streak going away from your wound.  The edges of the wound open up and separate.  Your wound is bleeding, and the bleeding does not stop with gentle pressure.  You have a rash.  You faint.  You have trouble  breathing. Summary  Always wash your hands with soap and water before changing your bandage (dressing).  To help with healing, eat foods that are rich in protein, vitamin A, vitamin C, and other nutrients.  Check your wound every day for signs of infection. Contact your health care provider if you suspect that your wound is infected. This information is not intended to replace advice given to you by your health care provider. Make sure you discuss any questions you have with your health care provider. Document Revised: 07/09/2018 Document Reviewed: 10/06/2015 Elsevier Patient Education  2020 [employer]  

## 2019-12-05 DIAGNOSIS — R21 Rash and other nonspecific skin eruption: Secondary | ICD-10-CM | POA: Diagnosis not present

## 2019-12-06 ENCOUNTER — Ambulatory Visit: Payer: Medicare HMO | Admitting: Family Medicine

## 2020-01-03 ENCOUNTER — Encounter: Payer: Self-pay | Admitting: *Deleted

## 2020-01-11 IMAGING — DX RIGHT ANKLE - COMPLETE 3+ VIEW
3 series · 3 of 3 positions shown · non-contrast
Comparison: None.

CLINICAL DATA: Twisting injury of the ankle

EXAM:
RIGHT ANKLE - COMPLETE 3+ VIEW

[ankle ap]
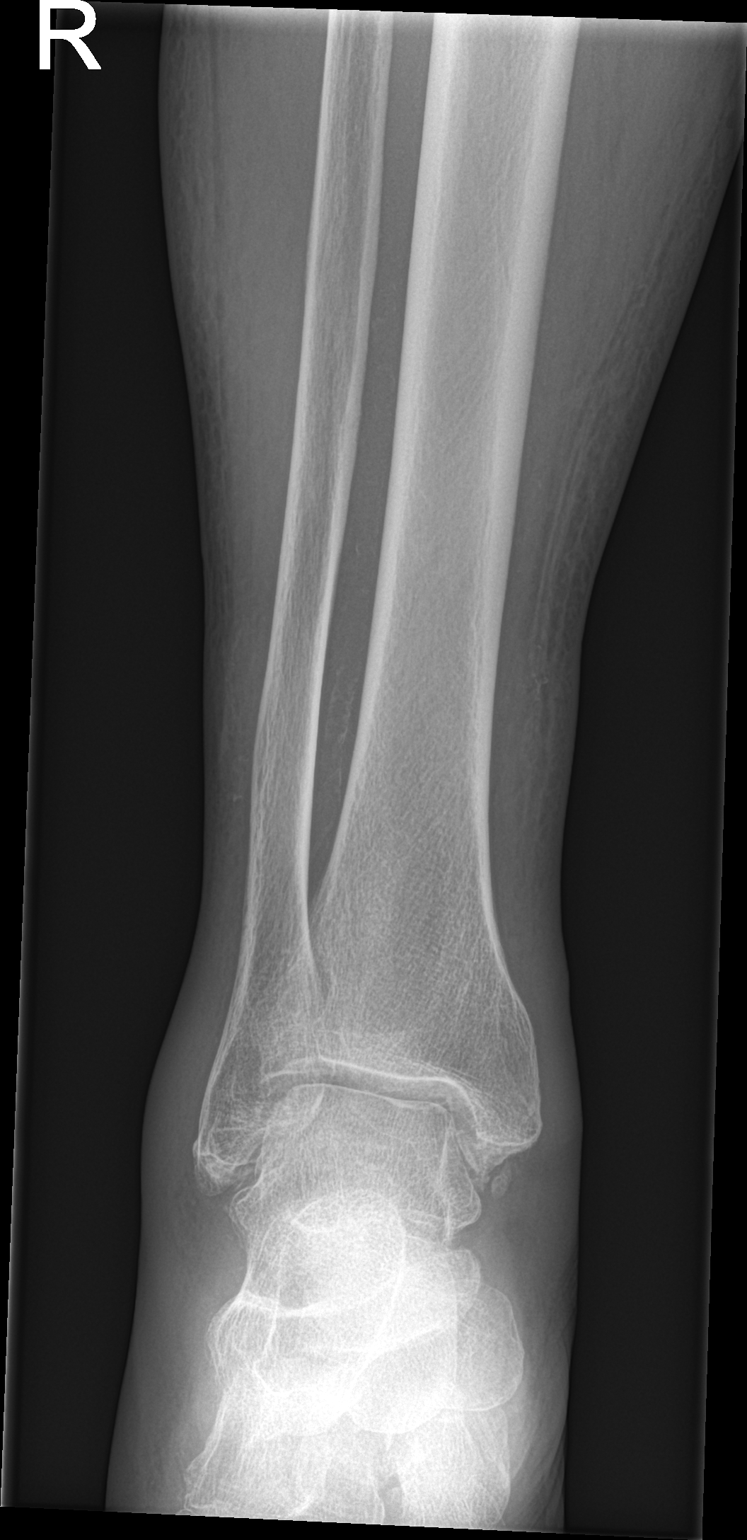

[ankle lat]
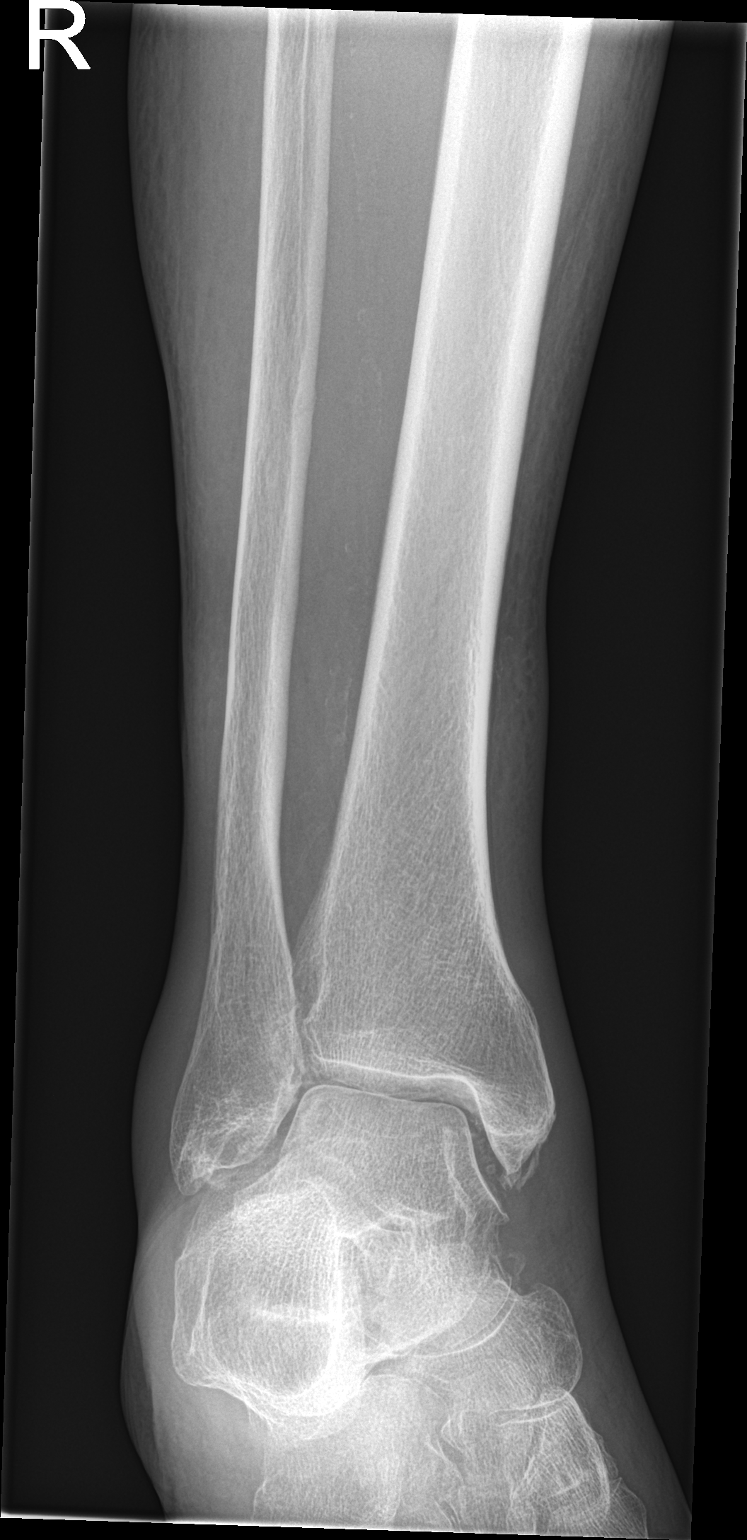

[ankle obl]
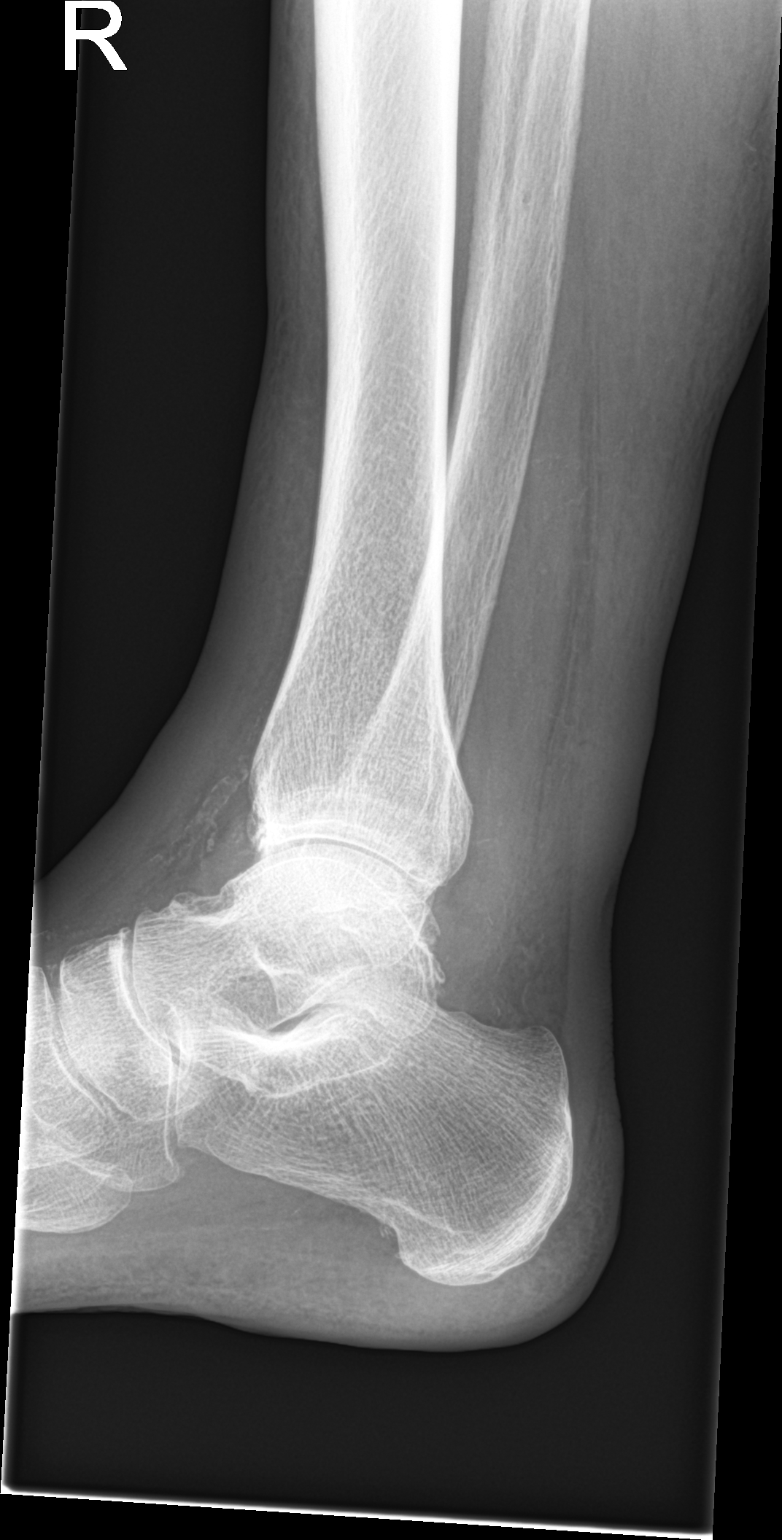

[3 of 3 positions shown; findings below may reference images not displayed]

FINDINGS: Generalized osteopenia. No acute fracture or dislocation.
Fragmentation adjacent to the medial malleolus consistent with
sequela prior avulsive injuries. Soft tissue swelling over the
lateral malleolus.

Intact ankle mortise. Small ankle joint effusion. Peripheral
vascular atherosclerotic disease.
IMPRESSION: 1.  No acute osseous injury of the right ankle.
2. Soft tissue swelling over the lateral malleolus.

## 2020-01-24 ENCOUNTER — Encounter: Payer: Self-pay | Admitting: *Deleted

## 2020-01-30 ENCOUNTER — Telehealth: Payer: Self-pay

## 2020-01-31 ENCOUNTER — Ambulatory Visit: Payer: Medicare HMO | Admitting: Family Medicine

## 2020-02-11 ENCOUNTER — Ambulatory Visit: Payer: Medicare HMO | Admitting: Family Medicine

## 2020-02-17 DIAGNOSIS — M170 Bilateral primary osteoarthritis of knee: Secondary | ICD-10-CM | POA: Diagnosis not present

## 2020-03-02 ENCOUNTER — Encounter: Payer: Self-pay | Admitting: Family Medicine

## 2020-03-02 ENCOUNTER — Ambulatory Visit (INDEPENDENT_AMBULATORY_CARE_PROVIDER_SITE_OTHER): Payer: Medicare HMO | Admitting: Family Medicine

## 2020-03-02 ENCOUNTER — Other Ambulatory Visit: Payer: Self-pay

## 2020-03-02 VITALS — Temp 97.6°F | Ht 67.0 in | Wt 126.2 lb

## 2020-03-02 DIAGNOSIS — F419 Anxiety disorder, unspecified: Secondary | ICD-10-CM | POA: Diagnosis not present

## 2020-03-02 DIAGNOSIS — F1320 Sedative, hypnotic or anxiolytic dependence, uncomplicated: Secondary | ICD-10-CM

## 2020-03-02 DIAGNOSIS — I10 Essential (primary) hypertension: Secondary | ICD-10-CM | POA: Diagnosis not present

## 2020-03-02 DIAGNOSIS — M5136 Other intervertebral disc degeneration, lumbar region: Secondary | ICD-10-CM | POA: Diagnosis not present

## 2020-03-02 DIAGNOSIS — R69 Illness, unspecified: Secondary | ICD-10-CM | POA: Diagnosis not present

## 2020-03-02 MED ORDER — MECLIZINE HCL 25 MG PO TABS
ORAL_TABLET | ORAL | 0 refills | Status: DC
Start: 2020-03-02 — End: 2020-08-03

## 2020-03-02 MED ORDER — DIAZEPAM 2 MG PO TABS: 2.0000 mg | ORAL_TABLET | Freq: Two times a day (BID) | ORAL | 1 refills | Status: AC | PRN

## 2020-03-05 ENCOUNTER — Encounter: Payer: Self-pay | Admitting: Family Medicine

## 2020-03-05 NOTE — Progress Notes (Signed)
Subjective:  Patient ID: Wesley Fowler., male    DOB: [birth year]  Age: 74 y.o. MRN: [id number]  CC: Follow-up (6 month)   HPI Wesley Fowler. presents for follow up of anxiety. He only takes it intermittently. PDMP review shows that he has a pattern of filling a bottle of 60 pills over a three month period.  BP remains good. Watching salt in diet. Arthritis is stable   Depression screen [facility] 2/9 03/02/2020 11/28/2019 08/01/2019  Decreased Interest 0 0 0  Down, Depressed, Hopeless 0 0 0  PHQ - 2 Score 0 0 0  Altered sleeping - - -  Tired, decreased energy - - -  Change in appetite - - -  Feeling bad or failure about yourself  - - -  Trouble concentrating - - -  Moving slowly or fidgety/restless - - -  Suicidal thoughts - - -  PHQ-9 Score - - -  Difficult doing work/chores - - -    History Wesley Fowler has a past medical history of Anxiety, Arthritis, Glaucoma, and History of high blood pressure.   He has a past surgical history that includes Retinal detachment surgery (2004); Knee arthroscopy (2004); and Laparoscopic inguinal hernia repair (03/15/11).   His family history includes Lung cancer in his maternal grandfather.He reports that he has quit smoking. He has never used smokeless tobacco. He reports that he does not drink alcohol and does not use drugs.    ROS Review of Systems  Constitutional: Negative for fever.  Respiratory: Negative for shortness of breath.   Cardiovascular: Negative for chest pain.  Musculoskeletal: Negative for arthralgias.  Skin: Negative for rash.    Objective:  Temp 97.6 F (36.4 C) (Temporal)   Ht 5\' 7"  (1.702 m)   Wt 126 lb 3.2 oz (57.2 kg)   BMI 19.77 kg/m   BP Readings from Last 3 Encounters:  11/28/19 (!) 147/84  08/01/19 136/84  02/20/19 (!) 155/86    Wt Readings from Last 3 Encounters:  03/02/20 126 lb 3.2 oz (57.2 kg)  11/28/19 125 lb 3.2 oz (56.8 kg)  08/01/19 125 lb 4 oz (56.8 kg)     Physical Exam Vitals reviewed.    Constitutional:      Appearance: He is well-developed.  HENT:     Head: Normocephalic and atraumatic.     Right Ear: Tympanic membrane and external ear normal. No decreased hearing noted.     Left Ear: Tympanic membrane and external ear normal. No decreased hearing noted.     Mouth/Throat:     Pharynx: No oropharyngeal exudate or posterior oropharyngeal erythema.  Eyes:     Pupils: Pupils are equal, round, and reactive to light.  Cardiovascular:     Rate and Rhythm: Normal rate and regular rhythm.     Heart sounds: No murmur heard.   Pulmonary:     Effort: No respiratory distress.     Breath sounds: Normal breath sounds.  Abdominal:     General: Bowel sounds are normal.     Palpations: Abdomen is soft. There is no mass.     Tenderness: There is no abdominal tenderness.  Musculoskeletal:     Cervical back: Normal range of motion and neck supple.       Assessment & Plan:   Little was seen today for follow-up.  Diagnoses and all orders for this visit:  Primary hypertension  Degeneration of lumbar intervertebral disc  Benzodiazepine dependence (HCC)  Anxiety  Other orders -  diazepam (VALIUM) 2 MG tablet; Take 1 tablet (2 mg total) by mouth every 12 (twelve) hours as needed for anxiety. -     meclizine (ANTIVERT) 25 MG tablet; TAKE 1 TABLET BY MOUTH THREE TIMES DAILY AS NEEDED FOR DIZZINESS       I have discontinued Wesley Huguenin Jr.'s buPROPion, cephALEXin, escitalopram, and triamcinolone cream. I am also having him maintain his aspirin EC, latanoprost, multivitamin with minerals, Multiple Vitamins-Minerals (EYE VITAMINS PO), famotidine, diazepam, and meclizine.  Allergies as of 03/02/2020      Reactions   Flagyl [metronidazole]    GI upset   Percocet [oxycodone-acetaminophen] Nausea And Vomiting   Sulfa Antibiotics Nausea Only      Medication List       Accurate as of March 02, 2020 11:59 PM. If you have any questions, ask your nurse or doctor.         STOP taking these medications   buPROPion 150 MG 24 hr tablet Commonly known as: WELLBUTRIN XL Stopped by: [name], MD   cephALEXin 500 MG capsule Commonly known as: KEFLEX Stopped by: [name], MD   escitalopram 10 MG tablet Commonly known as: LEXAPRO Stopped by: [name], MD   triamcinolone cream 0.5 % Commonly known as: KENALOG Stopped by: [name], MD     TAKE these medications   aspirin EC 325 MG tablet Take 81 mg by mouth daily. Patient takes sporadically   diazepam 2 MG tablet Commonly known as: VALIUM Take 1 tablet (2 mg total) by mouth every 12 (twelve) hours as needed for anxiety.   EYE VITAMINS PO Take 1 tablet by mouth daily.   famotidine 20 MG tablet Commonly known as: PEPCID Take 1 tablet by mouth twice daily   latanoprost 0.005 % ophthalmic solution Commonly known as: XALATAN   meclizine 25 MG tablet Commonly known as: ANTIVERT TAKE 1 TABLET BY MOUTH THREE TIMES DAILY AS NEEDED FOR DIZZINESS   multivitamin with minerals Tabs tablet Take 1 tablet by mouth daily.        Follow-up: Return in about 6 months (around 08/30/2020).  [name], M.D.

## 2020-03-09 DIAGNOSIS — M1711 Unilateral primary osteoarthritis, right knee: Secondary | ICD-10-CM | POA: Diagnosis not present

## 2020-04-28 ENCOUNTER — Telehealth: Payer: Self-pay

## 2020-04-28 NOTE — Telephone Encounter (Signed)
Pt started back on the Bupropion XL 150 mg about 4 1/2 wks His BP's have been running about 123/80 His last OV was 03/02/20 which is when this medication was stopped d/t his BPs, his next OV is 08/03/20 Please advise on refilling the above medication for a 90d supply until his next OV

## 2020-04-28 NOTE — Telephone Encounter (Signed)
Continue themedication and monitor BP at home. Remember resting BP should stay under 135/85

## 2020-04-28 NOTE — Telephone Encounter (Signed)
  Prescription Request  04/28/2020  What is the name of the medication or equipment? Bupropion XL 150mg   Have you contacted your pharmacy to request a refill? (if applicable) no  Which pharmacy would you like this sent to? [facility]   Patient notified that their request is being sent to the clinical staff for review and that they should receive a response within 2 business days.   60 day supply  Working now & b/p is under control.

## 2020-04-29 MED ORDER — BUPROPION HCL ER (XL) 150 MG PO TB24
150.0000 mg | ORAL_TABLET | Freq: Every day | ORAL | 0 refills | Status: DC
Start: 2020-04-29 — End: 2020-08-03

## 2020-04-29 NOTE — Telephone Encounter (Signed)
Patient informed, refill sent to pharmacy.

## 2020-05-18 ENCOUNTER — Other Ambulatory Visit: Payer: Self-pay | Admitting: Family Medicine

## 2020-05-18 ENCOUNTER — Telehealth: Payer: Self-pay | Admitting: *Deleted

## 2020-05-18 ENCOUNTER — Telehealth: Payer: Self-pay

## 2020-05-18 MED ORDER — HYDROXYZINE HCL 25 MG PO TABS: 25.0000 mg | ORAL_TABLET | Freq: Three times a day (TID) | ORAL | 2 refills | Status: DC | PRN

## 2020-05-18 NOTE — Telephone Encounter (Signed)
[employer] PA form came in for Meclizine tab 25 mg   [facility] has not ordered this med since Nov 2021 and it was a 30 day with No refills.  Since we have not ordered, nor pt requested. We will not fill out this form for need at this time.

## 2020-05-18 NOTE — Telephone Encounter (Signed)
I sent in a substitute for him.

## 2020-05-19 NOTE — Telephone Encounter (Signed)
Patient notified

## 2020-05-27 ENCOUNTER — Other Ambulatory Visit: Payer: Self-pay | Admitting: Family Medicine

## 2020-05-27 ENCOUNTER — Telehealth: Payer: Self-pay

## 2020-05-27 MED ORDER — PROCHLORPERAZINE MALEATE 10 MG PO TABS: 10.0000 mg | ORAL_TABLET | Freq: Four times a day (QID) | ORAL | 1 refills | Status: DC | PRN

## 2020-05-27 NOTE — Telephone Encounter (Signed)
Please let the patient know that I sent their prescription to their pharmacy. Thanks, WS 

## 2020-05-27 NOTE — Telephone Encounter (Signed)
Patient aware.

## 2020-06-10 ENCOUNTER — Encounter (INDEPENDENT_AMBULATORY_CARE_PROVIDER_SITE_OTHER): Payer: Medicare HMO | Admitting: Ophthalmology

## 2020-06-19 ENCOUNTER — Other Ambulatory Visit: Payer: Self-pay

## 2020-06-19 ENCOUNTER — Encounter (INDEPENDENT_AMBULATORY_CARE_PROVIDER_SITE_OTHER): Payer: Medicare Other | Admitting: Ophthalmology

## 2020-06-19 DIAGNOSIS — H338 Other retinal detachments: Secondary | ICD-10-CM

## 2020-06-19 DIAGNOSIS — H353112 Nonexudative age-related macular degeneration, right eye, intermediate dry stage: Secondary | ICD-10-CM

## 2020-06-19 DIAGNOSIS — H353121 Nonexudative age-related macular degeneration, left eye, early dry stage: Secondary | ICD-10-CM

## 2020-06-19 DIAGNOSIS — H35371 Puckering of macula, right eye: Secondary | ICD-10-CM

## 2020-06-19 DIAGNOSIS — H43813 Vitreous degeneration, bilateral: Secondary | ICD-10-CM

## 2020-07-15 ENCOUNTER — Telehealth: Payer: Self-pay

## 2020-07-15 ENCOUNTER — Other Ambulatory Visit: Payer: Self-pay | Admitting: Family Medicine

## 2020-07-15 DIAGNOSIS — I340 Nonrheumatic mitral (valve) insufficiency: Secondary | ICD-10-CM

## 2020-07-15 DIAGNOSIS — Z125 Encounter for screening for malignant neoplasm of prostate: Secondary | ICD-10-CM

## 2020-07-15 DIAGNOSIS — G450 Vertebro-basilar artery syndrome: Secondary | ICD-10-CM

## 2020-07-15 DIAGNOSIS — E871 Hypo-osmolality and hyponatremia: Secondary | ICD-10-CM

## 2020-07-15 DIAGNOSIS — I10 Essential (primary) hypertension: Secondary | ICD-10-CM

## 2020-07-15 NOTE — Telephone Encounter (Signed)
Done. Thanks, WS 

## 2020-07-16 NOTE — Telephone Encounter (Signed)
Left message lab orders are in.

## 2020-07-20 ENCOUNTER — Other Ambulatory Visit: Payer: Self-pay

## 2020-07-20 ENCOUNTER — Other Ambulatory Visit: Payer: Medicare Other

## 2020-07-21 LAB — CMP14+EGFR
ALT: 21 IU/L (ref 0–44)
AST: 24 IU/L (ref 0–40)
Albumin/Globulin Ratio: 2.2 (ref 1.2–2.2)
Albumin: 4.4 g/dL (ref 3.7–4.7)
Alkaline Phosphatase: 113 IU/L (ref 44–121)
BUN/Creatinine Ratio: 5 — ABNORMAL LOW (ref 10–24)
BUN: 5 mg/dL — ABNORMAL LOW (ref 8–27)
Bilirubin Total: 0.6 mg/dL (ref 0.0–1.2)
CO2: 21 mmol/L (ref 20–29)
Calcium: 9 mg/dL (ref 8.6–10.2)
Chloride: 97 mmol/L (ref 96–106)
Creatinine, Ser: 0.94 mg/dL (ref 0.76–1.27)
Globulin, Total: 2 g/dL (ref 1.5–4.5)
Glucose: 89 mg/dL (ref 65–99)
Potassium: 4.4 mmol/L (ref 3.5–5.2)
Sodium: 132 mmol/L — ABNORMAL LOW (ref 134–144)
Total Protein: 6.4 g/dL (ref 6.0–8.5)
eGFR: 86 mL/min/{1.73_m2} (ref >59)

## 2020-07-21 LAB — CBC WITH DIFFERENTIAL/PLATELET
Basophils Absolute: 0.1 10*3/uL (ref 0.0–0.2)
Basos: 1 %
EOS (ABSOLUTE): 0.1 10*3/uL (ref 0.0–0.4)
Eos: 1 %
Hematocrit: 36.8 % — ABNORMAL LOW (ref 37.5–51.0)
Hemoglobin: 13.2 g/dL (ref 13.0–17.7)
Immature Grans (Abs): 0 10*3/uL (ref 0.0–0.1)
Immature Granulocytes: 0 %
Lymphocytes Absolute: 1.1 10*3/uL (ref 0.7–3.1)
Lymphs: 17 %
MCH: 32.6 pg (ref 26.6–33.0)
MCHC: 35.9 g/dL — ABNORMAL HIGH (ref 31.5–35.7)
MCV: 91 fL (ref 79–97)
Monocytes Absolute: 0.6 10*3/uL (ref 0.1–0.9)
Monocytes: 10 %
Neutrophils Absolute: 4.3 10*3/uL (ref 1.4–7.0)
Neutrophils: 71 %
Platelets: 255 10*3/uL (ref 150–450)
RBC: 4.05 x10E6/uL — ABNORMAL LOW (ref 4.14–5.80)
RDW: 11.3 % — ABNORMAL LOW (ref 11.6–15.4)
WBC: 6.1 10*3/uL (ref 3.4–10.8)

## 2020-07-21 LAB — LIPID PANEL
Chol/HDL Ratio: 2.2 ratio (ref 0.0–5.0)
Cholesterol, Total: 214 mg/dL — ABNORMAL HIGH (ref 100–199)
HDL: 99 mg/dL (ref >39)
LDL Chol Calc (NIH): 108 mg/dL — ABNORMAL HIGH (ref 0–99)
Triglycerides: 37 mg/dL (ref 0–149)
VLDL Cholesterol Cal: 7 mg/dL (ref 5–40)

## 2020-07-21 LAB — PSA, TOTAL AND FREE
PSA, Free Pct: 25.6 %
PSA, Free: 0.23 ng/mL
Prostate Specific Ag, Serum: 0.9 ng/mL (ref 0.0–4.0)

## 2020-08-03 ENCOUNTER — Ambulatory Visit (INDEPENDENT_AMBULATORY_CARE_PROVIDER_SITE_OTHER): Payer: Medicare Other | Admitting: Family Medicine

## 2020-08-03 ENCOUNTER — Encounter: Payer: Self-pay | Admitting: Family Medicine

## 2020-08-03 ENCOUNTER — Other Ambulatory Visit: Payer: Self-pay

## 2020-08-03 VITALS — BP 134/79 | HR 79 | Temp 97.7°F | Ht 67.0 in | Wt 127.2 lb

## 2020-08-03 DIAGNOSIS — Z0289 Encounter for other administrative examinations: Secondary | ICD-10-CM

## 2020-08-03 DIAGNOSIS — F419 Anxiety disorder, unspecified: Secondary | ICD-10-CM | POA: Diagnosis not present

## 2020-08-03 DIAGNOSIS — Z0001 Encounter for general adult medical examination with abnormal findings: Secondary | ICD-10-CM | POA: Diagnosis not present

## 2020-08-03 DIAGNOSIS — Z0000 Encounter for general adult medical examination without abnormal findings: Secondary | ICD-10-CM

## 2020-08-03 DIAGNOSIS — F1320 Sedative, hypnotic or anxiolytic dependence, uncomplicated: Secondary | ICD-10-CM

## 2020-08-03 MED ORDER — BUPROPION HCL ER (XL) 150 MG PO TB24: 150.0000 mg | ORAL_TABLET | Freq: Every day | ORAL | 1 refills | Status: DC

## 2020-08-03 MED ORDER — DIAZEPAM 2 MG PO TABS: 2.0000 mg | ORAL_TABLET | Freq: Two times a day (BID) | ORAL | 1 refills | Status: DC | PRN

## 2020-08-03 MED ORDER — MECLIZINE HCL 25 MG PO TABS: ORAL_TABLET | ORAL | 1 refills | Status: DC

## 2020-08-03 NOTE — Progress Notes (Signed)
Subjective:  Patient ID: Wesley Fowler., male    DOB: 1945-07-28  Age: 75 y.o. MRN: [id number]  CC: Annual Exam  Wife HPI Wesley Fowler. presents for Complete physical. Wife ill with Polymayalgia Rheumatica and Giant Cell Arteritis. Pt. Taking care of her. Tired, nervous and anxious. Taking valium prn for the last 15-20 years for anxiety with being in crowds. Takes about 15-20 per month.  Depression screen [facility] 2/9 08/03/2020 08/03/2020 03/02/2020  Decreased Interest 0 0 0  Down, Depressed, Hopeless 0 0 0  PHQ - 2 Score 0 0 0  Altered sleeping 2 - -  Tired, decreased energy 1 - -  Change in appetite 0 - -  Feeling bad or failure about yourself  0 - -  Trouble concentrating 0 - -  Moving slowly or fidgety/restless 0 - -  Suicidal thoughts - - -  PHQ-9 Score 3 - -  Difficult doing work/chores Not difficult at all - -    History Wesley Fowler has a past medical history of Anxiety, Arthritis, Glaucoma, and History of high blood pressure.   He has a past surgical history that includes Retinal detachment surgery (2004); Knee arthroscopy (2004); and Laparoscopic inguinal hernia repair (03/15/11).   His family history includes Lung cancer in his maternal grandfather.He reports that he has quit smoking. He has never used smokeless tobacco. He reports that he does not drink alcohol and does not use drugs.    ROS Review of Systems  Constitutional: Negative for activity change, fatigue and unexpected weight change.  HENT: Negative for congestion, ear pain, hearing loss, postnasal drip and trouble swallowing.   Eyes: Negative for pain and visual disturbance.  Respiratory: Negative for cough, chest tightness and shortness of breath.   Cardiovascular: Negative for chest pain, palpitations and leg swelling.  Gastrointestinal: Negative for abdominal distention, abdominal pain, blood in stool, constipation, diarrhea, nausea and vomiting.  Endocrine: Negative for cold intolerance, heat intolerance and  polydipsia.  Genitourinary: Negative for difficulty urinating, dysuria, flank pain, frequency and urgency.  Musculoskeletal: Negative for arthralgias and joint swelling.  Skin: Negative for color change, rash and wound.  Neurological: Negative for dizziness, syncope, speech difficulty, weakness, light-headedness, numbness and headaches.  Hematological: Does not bruise/bleed easily.  Psychiatric/Behavioral: Negative for confusion, decreased concentration, dysphoric mood and sleep disturbance. The patient is not nervous/anxious.     Objective:  BP 134/79   Pulse 79   Temp 97.7 F (36.5 C)   Ht 5\' 7"  (1.702 m)   Wt 127 lb 3.2 oz (57.7 kg)   SpO2 100%   BMI 19.92 kg/m   BP Readings from Last 3 Encounters:  08/03/20 134/79  11/28/19 (!) 147/84  08/01/19 136/84    Wt Readings from Last 3 Encounters:  08/03/20 127 lb 3.2 oz (57.7 kg)  03/02/20 126 lb 3.2 oz (57.2 kg)  11/28/19 125 lb 3.2 oz (56.8 kg)     Physical Exam Constitutional:      Appearance: He is well-developed.  HENT:     Head: Normocephalic and atraumatic.  Eyes:     Pupils: Pupils are equal, round, and reactive to light.  Neck:     Thyroid: No thyromegaly.     Trachea: No tracheal deviation.  Cardiovascular:     Rate and Rhythm: Normal rate and regular rhythm.     Heart sounds: Normal heart sounds. No murmur heard. No friction rub. No gallop.   Pulmonary:     Breath sounds: Normal breath sounds. No wheezing  or rales.  Abdominal:     General: Bowel sounds are normal. There is no distension.     Palpations: Abdomen is soft. There is no mass.     Tenderness: There is no abdominal tenderness.     Hernia: There is no hernia in the left inguinal area.  Genitourinary:    Penis: Normal.      Testes: Normal.  Musculoskeletal:        General: Normal range of motion.     Cervical back: Normal range of motion.  Lymphadenopathy:     Cervical: No cervical adenopathy.  Skin:    General: Skin is warm and dry.   Neurological:     Mental Status: He is alert and oriented to person, place, and time.       Assessment & Plan:   Wesley Fowler was seen today for annual exam.  Diagnoses and all orders for this visit:  Well adult exam  Anxiety  Benzodiazepine dependence ([place])  Other orders -     diazepam (VALIUM) 2 MG tablet; Take 1 tablet (2 mg total) by mouth every 12 (twelve) hours as needed for anxiety. -     buPROPion (WELLBUTRIN XL) 150 MG 24 hr tablet; Take 1 tablet (150 mg total) by mouth daily. -     meclizine (ANTIVERT) 25 MG tablet; TAKE 1 TABLET BY MOUTH THREE TIMES DAILY AS NEEDED FOR DIZZINESS       I have discontinued Wesley Huguenin Jr.'s prochlorperazine. I am also having him start on diazepam. Additionally, I am having him maintain his aspirin EC, latanoprost, multivitamin with minerals, Multiple Vitamins-Minerals (EYE VITAMINS PO), famotidine, hydrOXYzine, buPROPion, and meclizine.  Allergies as of 08/03/2020      Reactions   Flagyl [metronidazole]    GI upset   Percocet [oxycodone-acetaminophen] Nausea And Vomiting   Sulfa Antibiotics Nausea Only      Medication List       Accurate as of Aug 03, 2020 10:35 AM. If you have any questions, ask your nurse or doctor.        STOP taking these medications   prochlorperazine 10 MG tablet Commonly known as: COMPAZINE Stopped by: [name], MD     TAKE these medications   aspirin EC 325 MG tablet Take 81 mg by mouth daily. Patient takes sporadically   buPROPion 150 MG 24 hr tablet Commonly known as: WELLBUTRIN XL Take 1 tablet (150 mg total) by mouth daily.   diazepam 2 MG tablet Commonly known as: Valium Take 1 tablet (2 mg total) by mouth every 12 (twelve) hours as needed for anxiety. Started by: [name], MD   EYE VITAMINS PO Take 1 tablet by mouth daily.   famotidine 20 MG tablet Commonly known as: PEPCID Take 1 tablet by mouth twice daily   hydrOXYzine 25 MG tablet Commonly known as:  ATARAX/VISTARIL Take 1 tablet (25 mg total) by mouth 3 (three) times daily as needed (dizziness).   latanoprost 0.005 % ophthalmic solution Commonly known as: XALATAN   meclizine 25 MG tablet Commonly known as: ANTIVERT TAKE 1 TABLET BY MOUTH THREE TIMES DAILY AS NEEDED FOR DIZZINESS   multivitamin with minerals Tabs tablet Take 1 tablet by mouth daily.        Follow-up: Return in about 6 months (around 02/03/2021).  [name], M.D.

## 2020-08-03 NOTE — Patient Instructions (Signed)
Earwax removal: ° °Debrox drops are available without a prescription at your pharmacy. ° °Lay on your side with the ear up that you want to treat. Place for 5 drops of the Debrox in the ear canal and lay still for 15 minutes. After that time you considered up and allow the excess to run out of the year and catch it with a Kleenex. Repeat this with the other ear if needed. ° °Repeat this process daily for 1 week. By that time the ear should feel less clogged and her hearing should be better, if not, follow up in the office for recheck of the ear. ° °Thanks, °Jefrey Raburn °

## 2020-08-03 NOTE — Addendum Note (Signed)
Addended by: Baldomero Lamy B on: 08/03/2020 10:46 AM   Modules accepted: Orders

## 2020-08-12 LAB — TOXASSURE SELECT 13 (MW), URINE

## 2021-02-04 ENCOUNTER — Other Ambulatory Visit: Payer: Self-pay

## 2021-02-04 ENCOUNTER — Encounter: Payer: Self-pay | Admitting: Family Medicine

## 2021-02-04 ENCOUNTER — Ambulatory Visit (INDEPENDENT_AMBULATORY_CARE_PROVIDER_SITE_OTHER): Payer: Medicare Other | Admitting: Family Medicine

## 2021-02-04 VITALS — BP 134/72 | HR 68 | Temp 97.6°F | Wt 130.6 lb

## 2021-02-04 DIAGNOSIS — Z13220 Encounter for screening for lipoid disorders: Secondary | ICD-10-CM

## 2021-02-04 DIAGNOSIS — I10 Essential (primary) hypertension: Secondary | ICD-10-CM

## 2021-02-04 LAB — CMP14+EGFR
ALT: 20 IU/L (ref 0–44)
AST: 23 IU/L (ref 0–40)
Albumin/Globulin Ratio: 3 — ABNORMAL HIGH (ref 1.2–2.2)
Albumin: 4.5 g/dL (ref 3.7–4.7)
Alkaline Phosphatase: 112 IU/L (ref 44–121)
BUN/Creatinine Ratio: 5 — ABNORMAL LOW (ref 10–24)
BUN: 4 mg/dL — ABNORMAL LOW (ref 8–27)
Bilirubin Total: 0.4 mg/dL (ref 0.0–1.2)
CO2: 26 mmol/L (ref 20–29)
Calcium: 9.7 mg/dL (ref 8.6–10.2)
Chloride: 95 mmol/L — ABNORMAL LOW (ref 96–106)
Creatinine, Ser: 0.82 mg/dL (ref 0.76–1.27)
Globulin, Total: 1.5 g/dL (ref 1.5–4.5)
Glucose: 94 mg/dL (ref 70–99)
Potassium: 5.2 mmol/L (ref 3.5–5.2)
Sodium: 130 mmol/L — ABNORMAL LOW (ref 134–144)
Total Protein: 6 g/dL (ref 6.0–8.5)
eGFR: 93 mL/min/{1.73_m2} (ref >59)

## 2021-02-04 LAB — CBC WITH DIFFERENTIAL/PLATELET
Basophils Absolute: 0.1 10*3/uL (ref 0.0–0.2)
Basos: 1 %
EOS (ABSOLUTE): 0.1 10*3/uL (ref 0.0–0.4)
Eos: 2 %
Hematocrit: 36.4 % — ABNORMAL LOW (ref 37.5–51.0)
Hemoglobin: 12.8 g/dL — ABNORMAL LOW (ref 13.0–17.7)
Immature Grans (Abs): 0 10*3/uL (ref 0.0–0.1)
Immature Granulocytes: 0 %
Lymphocytes Absolute: 1 10*3/uL (ref 0.7–3.1)
Lymphs: 23 %
MCH: 32.3 pg (ref 26.6–33.0)
MCHC: 35.2 g/dL (ref 31.5–35.7)
MCV: 92 fL (ref 79–97)
Monocytes Absolute: 0.6 10*3/uL (ref 0.1–0.9)
Monocytes: 13 %
Neutrophils Absolute: 2.6 10*3/uL (ref 1.4–7.0)
Neutrophils: 61 %
Platelets: 261 10*3/uL (ref 150–450)
RBC: 3.96 x10E6/uL — ABNORMAL LOW (ref 4.14–5.80)
RDW: 11.1 % — ABNORMAL LOW (ref 11.6–15.4)
WBC: 4.3 10*3/uL (ref 3.4–10.8)

## 2021-02-04 LAB — LIPID PANEL
Chol/HDL Ratio: 2.1 ratio (ref 0.0–5.0)
Cholesterol, Total: 199 mg/dL (ref 100–199)
HDL: 95 mg/dL (ref >39)
LDL Chol Calc (NIH): 96 mg/dL (ref 0–99)
Triglycerides: 43 mg/dL (ref 0–149)
VLDL Cholesterol Cal: 8 mg/dL (ref 5–40)

## 2021-02-04 MED ORDER — DIAZEPAM 2 MG PO TABS: 2.0000 mg | ORAL_TABLET | Freq: Two times a day (BID) | ORAL | 1 refills | Status: DC | PRN

## 2021-02-04 MED ORDER — MECLIZINE HCL 25 MG PO TABS: ORAL_TABLET | ORAL | 1 refills | Status: DC

## 2021-02-04 MED ORDER — BUPROPION HCL ER (XL) 150 MG PO TB24: 150.0000 mg | ORAL_TABLET | Freq: Every day | ORAL | 3 refills | Status: DC

## 2021-02-04 NOTE — Progress Notes (Signed)
Subjective:  Patient ID: Wesley Fowler., male    DOB: 1945/12/28  Age: 75 y.o. MRN: [id number]  CC: Medical Management of Chronic Issues   HPI Johnhenry Tippin. presents for  presents for  follow-up of hypertension. Patient has no history of headache chest pain or shortness of breath or recent cough. Patient also denies symptoms of TIA such as focal numbness or weakness. Patient denies side effects from medication. States taking it regularly.   Depression screen [facility] [place] 2/9 02/04/2021 08/03/2020 08/03/2020  Decreased Interest 0 0 0  Down, Depressed, Hopeless 0 0 0  PHQ - 2 Score 0 0 0  Altered sleeping - 2 -  Tired, decreased energy - 1 -  Change in appetite - 0 -  Feeling bad or failure about yourself  - 0 -  Trouble concentrating - 0 -  Moving slowly or fidgety/restless - 0 -  Suicidal thoughts - - -  PHQ-9 Score - 3 -  Difficult doing work/chores - Not difficult at all -    History Fernie has a past medical history of Anxiety, Arthritis, Glaucoma, and History of high blood pressure.   He has a past surgical history that includes Retinal detachment surgery (2004); Knee arthroscopy (2004); and Laparoscopic inguinal hernia repair (03/15/11).   His family history includes Lung cancer in his maternal grandfather.He reports that he has quit smoking. He has never used smokeless tobacco. He reports that he does not drink alcohol and does not use drugs.    ROS Review of Systems  Constitutional:  Negative for fever.  Respiratory:  Negative for shortness of breath.   Cardiovascular:  Negative for chest pain.  Musculoskeletal:  Negative for arthralgias.  Skin:  Negative for rash.   Objective:  BP 134/72   Pulse 68   Temp 97.6 F (36.4 C)   Wt 130 lb 9.6 oz (59.2 kg)   SpO2 99%   BMI 20.45 kg/m   BP Readings from Last 3 Encounters:  02/04/21 134/72  08/03/20 134/79  11/28/19 (!) 147/84    Wt Readings from Last 3 Encounters:  02/04/21 130 lb 9.6 oz (59.2 kg)  08/03/20 127 lb  3.2 oz (57.7 kg)  03/02/20 126 lb 3.2 oz (57.2 kg)     Physical Exam Vitals reviewed.  Constitutional:      Appearance: He is well-developed.  HENT:     Head: Normocephalic and atraumatic.     Right Ear: External ear normal.     Left Ear: External ear normal.     Mouth/Throat:     Pharynx: No oropharyngeal exudate or posterior oropharyngeal erythema.  Eyes:     Pupils: Pupils are equal, round, and reactive to light.  Cardiovascular:     Rate and Rhythm: Normal rate and regular rhythm.     Heart sounds: No murmur heard. Pulmonary:     Effort: No respiratory distress.     Breath sounds: Normal breath sounds.  Musculoskeletal:     Cervical back: Normal range of motion and neck supple.  Neurological:     Mental Status: He is alert and oriented to person, place, and time.      Assessment & Plan:   [place] was seen today for medical management of chronic issues.  Diagnoses and all orders for this visit:  Primary hypertension -     CBC with Differential/Platelet -     CMP14+EGFR  Lipid screening -     Lipid panel  Other orders -  buPROPion (WELLBUTRIN XL) 150 MG 24 hr tablet; Take 1 tablet (150 mg total) by mouth daily. -     diazepam (VALIUM) 2 MG tablet; Take 1 tablet (2 mg total) by mouth every 12 (twelve) hours as needed for anxiety. -     meclizine (ANTIVERT) 25 MG tablet; TAKE 1 TABLET BY MOUTH THREE TIMES DAILY AS NEEDED FOR DIZZINESS      I am having Wesley Fowler. maintain his aspirin EC, latanoprost, multivitamin with minerals, Multiple Vitamins-Minerals (EYE VITAMINS PO), famotidine, hydrOXYzine, buPROPion, diazepam, and meclizine.  Allergies as of 02/04/2021       Reactions   Flagyl [metronidazole]    GI upset   Percocet [oxycodone-acetaminophen] Nausea And Vomiting   Sulfa Antibiotics Nausea Only        Medication List        Accurate as of February 04, 2021  9:04 AM. If you have any questions, ask your nurse or doctor.           aspirin EC 325 MG tablet Take 81 mg by mouth daily. Patient takes sporadically   buPROPion 150 MG 24 hr tablet Commonly known as: WELLBUTRIN XL Take 1 tablet (150 mg total) by mouth daily.   diazepam 2 MG tablet Commonly known as: Valium Take 1 tablet (2 mg total) by mouth every 12 (twelve) hours as needed for anxiety.   EYE VITAMINS PO Take 1 tablet by mouth daily.   famotidine 20 MG tablet Commonly known as: PEPCID Take 1 tablet by mouth twice daily   hydrOXYzine 25 MG tablet Commonly known as: ATARAX/VISTARIL Take 1 tablet (25 mg total) by mouth 3 (three) times daily as needed (dizziness).   latanoprost 0.005 % ophthalmic solution Commonly known as: XALATAN   meclizine 25 MG tablet Commonly known as: ANTIVERT TAKE 1 TABLET BY MOUTH THREE TIMES DAILY AS NEEDED FOR DIZZINESS   multivitamin with minerals Tabs tablet Take 1 tablet by mouth daily.         Follow-up: No follow-ups on file.  [name], M.D.

## 2021-02-08 NOTE — Progress Notes (Signed)
Hello Jabarie,  Your lab result is normal and/or stable.Some minor variations that are not significant are commonly marked abnormal, but do not represent any medical problem for you.  Best regards, [name], M.D.

## 2021-02-17 ENCOUNTER — Telehealth: Payer: Self-pay | Admitting: Family Medicine

## 2021-02-17 NOTE — Telephone Encounter (Signed)
Patient declined the Medicare Wellness Visit with [facility]   Patient does not feel he needs to complete this.  Do not call back to try to schedule.

## 2021-03-24 ENCOUNTER — Telehealth: Payer: Self-pay | Admitting: Family Medicine

## 2021-03-24 NOTE — Telephone Encounter (Signed)
Spoke with patient when he called in and labs were discussed.

## 2021-04-28 ENCOUNTER — Encounter: Payer: Self-pay | Admitting: Nurse Practitioner

## 2021-04-28 ENCOUNTER — Ambulatory Visit (INDEPENDENT_AMBULATORY_CARE_PROVIDER_SITE_OTHER): Payer: Medicare Other | Admitting: Nurse Practitioner

## 2021-04-28 DIAGNOSIS — R058 Other specified cough: Secondary | ICD-10-CM

## 2021-04-28 DIAGNOSIS — U071 COVID-19: Secondary | ICD-10-CM | POA: Diagnosis not present

## 2021-04-28 MED ORDER — BENZONATATE 100 MG PO CAPS: 100.0000 mg | ORAL_CAPSULE | Freq: Three times a day (TID) | ORAL | 0 refills | Status: DC | PRN

## 2021-04-28 MED ORDER — NIRMATRELVIR/RITONAVIR (PAXLOVID)TABLET: 3.0000 | ORAL_TABLET | Freq: Two times a day (BID) | ORAL | 0 refills | Status: AC

## 2021-04-28 NOTE — Progress Notes (Signed)
° °  Virtual Visit  Note Due to COVID-19 pandemic this visit was conducted virtually. This visit type was conducted due to national recommendations for restrictions regarding the COVID-19 Pandemic (e.g. social distancing, sheltering in place) in an effort to limit this patient's exposure and mitigate transmission in our community. All issues noted in this document were discussed and addressed.  A physical exam was not performed with this format.  I connected with Wesley Dolores. on 04/28/21 at 1:40 pm   by telephone and verified that I am speaking with the correct person using two identifiers. Wesley Dolores. is currently located at home with spouse  during visit. The provider, Ivy Lynn, NP is located in their office at time of visit.  I discussed the limitations, risks, security and privacy concerns of performing an evaluation and management service by telephone and the availability of in person appointments. I also discussed with the patient that there may be a patient responsible charge related to this service. The patient expressed understanding and agreed to proceed.   History and Present Illness:  URI  This is a new problem. The current episode started yesterday. The problem has been gradually worsening. The maximum temperature recorded prior to his arrival was 100.4 - 100.9 F. Associated symptoms include congestion, coughing, nausea, sinus pain and a sore throat. Pertinent negatives include no rash. He has tried acetaminophen for the symptoms. The treatment provided mild relief.     Review of Systems  Constitutional:  Positive for chills, fever and malaise/fatigue.  HENT:  Positive for congestion, sinus pain and sore throat.   Respiratory:  Positive for cough.   Gastrointestinal:  Positive for nausea.  Skin:  Negative for rash.  All other systems reviewed and are negative.   Observations/Objective: Tele-visit patient not in distress  Assessment and Plan: Positive for  Covid-19 Take meds as prescribed - Use a cool mist humidifier  -Use saline nose sprays frequently -Force fluids -For fever or aches or pains- take Tylenol or ibuprofen. -Benzonate for cough -Paxlovid RX sent to pharmacy Follow up with worsening unresolved symptoms   Follow Up Instructions:  Follow up with worsening or unresolved symptoms    I discussed the assessment and treatment plan with the patient. The patient was provided an opportunity to ask questions and all were answered. The patient agreed with the plan and demonstrated an understanding of the instructions.   The patient was advised to call back or seek an in-person evaluation if the symptoms worsen or if the condition fails to improve as anticipated.  The above assessment and management plan was discussed with the patient. The patient verbalized understanding of and has agreed to the management plan. Patient is aware to call the clinic if symptoms persist or worsen. Patient is aware when to return to the clinic for a follow-up visit. Patient educated on when it is appropriate to go to the emergency department.   Time call ended: 1:50 pm    I provided 10 minutes of  non face-to-face time during this encounter.    Ivy Lynn, NP

## 2021-06-28 ENCOUNTER — Encounter (INDEPENDENT_AMBULATORY_CARE_PROVIDER_SITE_OTHER): Payer: Medicare Other | Admitting: Ophthalmology

## 2021-07-16 ENCOUNTER — Telehealth: Payer: Self-pay | Admitting: Family Medicine

## 2021-07-16 ENCOUNTER — Other Ambulatory Visit: Payer: Self-pay | Admitting: *Deleted

## 2021-07-16 DIAGNOSIS — I10 Essential (primary) hypertension: Secondary | ICD-10-CM

## 2021-07-16 DIAGNOSIS — Z13220 Encounter for screening for lipoid disorders: Secondary | ICD-10-CM

## 2021-07-16 DIAGNOSIS — Z79899 Other long term (current) drug therapy: Secondary | ICD-10-CM

## 2021-07-16 NOTE — Telephone Encounter (Signed)
Done. Thanks, WS 

## 2021-07-16 NOTE — Telephone Encounter (Signed)
Patient would like to come in for labs before his appt. Please call and let him know when they are added.  ?

## 2021-07-21 ENCOUNTER — Other Ambulatory Visit: Payer: Medicare Other

## 2021-07-21 ENCOUNTER — Other Ambulatory Visit: Payer: Self-pay | Admitting: Family Medicine

## 2021-07-21 ENCOUNTER — Other Ambulatory Visit: Payer: Self-pay | Admitting: *Deleted

## 2021-07-21 DIAGNOSIS — E559 Vitamin D deficiency, unspecified: Secondary | ICD-10-CM

## 2021-07-21 DIAGNOSIS — I10 Essential (primary) hypertension: Secondary | ICD-10-CM

## 2021-07-21 DIAGNOSIS — Z79899 Other long term (current) drug therapy: Secondary | ICD-10-CM

## 2021-07-21 DIAGNOSIS — Z13220 Encounter for screening for lipoid disorders: Secondary | ICD-10-CM

## 2021-07-21 DIAGNOSIS — Z125 Encounter for screening for malignant neoplasm of prostate: Secondary | ICD-10-CM

## 2021-07-23 ENCOUNTER — Telehealth: Payer: Self-pay | Admitting: Family Medicine

## 2021-07-23 NOTE — Telephone Encounter (Signed)
Stacks patient please review labs. Covering pcp ?

## 2021-07-26 ENCOUNTER — Encounter (INDEPENDENT_AMBULATORY_CARE_PROVIDER_SITE_OTHER): Payer: Medicare Other | Admitting: Ophthalmology

## 2021-07-26 DIAGNOSIS — H353121 Nonexudative age-related macular degeneration, left eye, early dry stage: Secondary | ICD-10-CM | POA: Diagnosis not present

## 2021-07-26 DIAGNOSIS — H353112 Nonexudative age-related macular degeneration, right eye, intermediate dry stage: Secondary | ICD-10-CM

## 2021-07-26 DIAGNOSIS — H35371 Puckering of macula, right eye: Secondary | ICD-10-CM

## 2021-07-26 DIAGNOSIS — H338 Other retinal detachments: Secondary | ICD-10-CM | POA: Diagnosis not present

## 2021-07-26 DIAGNOSIS — H43813 Vitreous degeneration, bilateral: Secondary | ICD-10-CM

## 2021-07-26 LAB — PSA, TOTAL AND FREE
PSA, Free Pct: 24 %
PSA, Free: 0.24 ng/mL
Prostate Specific Ag, Serum: 1 ng/mL (ref 0.0–4.0)

## 2021-07-26 LAB — LIPID PANEL
Chol/HDL Ratio: 2.3 ratio (ref 0.0–5.0)
Cholesterol, Total: 234 mg/dL — ABNORMAL HIGH (ref 100–199)
HDL: 103 mg/dL (ref >39)
LDL Chol Calc (NIH): 121 mg/dL — ABNORMAL HIGH (ref 0–99)
Triglycerides: 57 mg/dL (ref 0–149)
VLDL Cholesterol Cal: 10 mg/dL (ref 5–40)

## 2021-07-26 LAB — CMP14+EGFR
ALT: 17 IU/L (ref 0–44)
AST: 22 IU/L (ref 0–40)
Albumin/Globulin Ratio: 3.5 — ABNORMAL HIGH (ref 1.2–2.2)
Albumin: 4.9 g/dL — ABNORMAL HIGH (ref 3.7–4.7)
Alkaline Phosphatase: 98 IU/L (ref 44–121)
BUN/Creatinine Ratio: 5 — ABNORMAL LOW (ref 10–24)
BUN: 5 mg/dL — ABNORMAL LOW (ref 8–27)
Bilirubin Total: 0.6 mg/dL (ref 0.0–1.2)
CO2: 19 mmol/L — ABNORMAL LOW (ref 20–29)
Calcium: 9.5 mg/dL (ref 8.6–10.2)
Chloride: 96 mmol/L (ref 96–106)
Creatinine, Ser: 0.93 mg/dL (ref 0.76–1.27)
Globulin, Total: 1.4 g/dL — ABNORMAL LOW (ref 1.5–4.5)
Glucose: 100 mg/dL — ABNORMAL HIGH (ref 70–99)
Potassium: 4.8 mmol/L (ref 3.5–5.2)
Sodium: 132 mmol/L — ABNORMAL LOW (ref 134–144)
Total Protein: 6.3 g/dL (ref 6.0–8.5)
eGFR: 87 mL/min/{1.73_m2} (ref >59)

## 2021-07-26 LAB — CBC WITH DIFFERENTIAL/PLATELET
Basophils Absolute: 0.1 10*3/uL (ref 0.0–0.2)
Basos: 1 %
EOS (ABSOLUTE): 0.1 10*3/uL (ref 0.0–0.4)
Eos: 2 %
Hematocrit: 37.5 % (ref 37.5–51.0)
Hemoglobin: 13.4 g/dL (ref 13.0–17.7)
Immature Grans (Abs): 0 10*3/uL (ref 0.0–0.1)
Immature Granulocytes: 0 %
Lymphocytes Absolute: 1.1 10*3/uL (ref 0.7–3.1)
Lymphs: 22 %
MCH: 32.8 pg (ref 26.6–33.0)
MCHC: 35.7 g/dL (ref 31.5–35.7)
MCV: 92 fL (ref 79–97)
Monocytes Absolute: 0.5 10*3/uL (ref 0.1–0.9)
Monocytes: 11 %
Neutrophils Absolute: 3 10*3/uL (ref 1.4–7.0)
Neutrophils: 64 %
Platelets: 235 10*3/uL (ref 150–450)
RBC: 4.09 x10E6/uL — ABNORMAL LOW (ref 4.14–5.80)
RDW: 11.5 % — ABNORMAL LOW (ref 11.6–15.4)
WBC: 4.7 10*3/uL (ref 3.4–10.8)

## 2021-07-26 LAB — VITAMIN D 25 HYDROXY (VIT D DEFICIENCY, FRACTURES): Vit D, 25-Hydroxy: 72.7 ng/mL (ref 30.0–100.0)

## 2021-07-27 LAB — TOXASSURE SELECT 13 (MW), URINE

## 2021-08-04 ENCOUNTER — Ambulatory Visit (INDEPENDENT_AMBULATORY_CARE_PROVIDER_SITE_OTHER): Payer: Medicare Other | Admitting: Family Medicine

## 2021-08-04 ENCOUNTER — Encounter: Payer: Self-pay | Admitting: Family Medicine

## 2021-08-04 VITALS — BP 146/79 | HR 69 | Temp 96.8°F | Ht 67.0 in | Wt 126.6 lb

## 2021-08-04 DIAGNOSIS — Z125 Encounter for screening for malignant neoplasm of prostate: Secondary | ICD-10-CM | POA: Diagnosis not present

## 2021-08-04 DIAGNOSIS — I10 Essential (primary) hypertension: Secondary | ICD-10-CM | POA: Diagnosis not present

## 2021-08-04 DIAGNOSIS — F419 Anxiety disorder, unspecified: Secondary | ICD-10-CM

## 2021-08-04 MED ORDER — MECLIZINE HCL 25 MG PO TABS: ORAL_TABLET | ORAL | 1 refills | Status: DC

## 2021-08-04 MED ORDER — DIAZEPAM 2 MG PO TABS
2.0000 mg | ORAL_TABLET | Freq: Two times a day (BID) | ORAL | 1 refills | Status: DC | PRN
Start: 2021-08-04 — End: 2022-01-13

## 2021-08-04 NOTE — Progress Notes (Signed)
? ?Subjective:  ?Patient ID: Wesley Buist., male    DOB: 1945/06/28  Age: 76 y.o. MRN: [id number] ? ?CC: Annual Exam ? ? ?HPI ?Wesley Fowler. presents for  follow-up of hypertension. Patient has no history of headache chest pain or shortness of breath or recent cough. Patient also denies symptoms of TIA such as focal numbness or weakness. Patient denies side effects from medication. States taking it regularly. ? ? ?  08/04/2021  ?  9:30 AM 08/04/2021  ?  9:23 AM 02/04/2021  ?  8:51 AM 08/03/2020  ?  9:41 AM 08/03/2020  ?  9:28 AM  ?Depression screen PHQ 2/9  ?Decreased Interest 0 0 0 0 0  ?Down, Depressed, Hopeless 3 0 0 0 0  ?PHQ - 2 Score 3 0 0 0 0  ?Altered sleeping 0   2   ?Tired, decreased energy 0   1   ?Change in appetite 0   0   ?Feeling bad or failure about yourself  0   0   ?Trouble concentrating 0   0   ?Moving slowly or fidgety/restless 0   0   ?Suicidal thoughts 0      ?PHQ-9 Score 3   3   ?Difficult doing work/chores Not difficult at all   Not difficult at all   ? ? ?  08/04/2021  ?  9:32 AM 08/03/2020  ?  9:39 AM 08/01/2019  ?  9:42 AM  ?GAD 7 : Generalized Anxiety Score  ?Nervous, Anxious, on Edge '1 1 1  '$ ?Control/stop worrying 0 0 0  ?Worry too much - different things 0 0 0  ?Trouble relaxing 0 0 0  ?Restless 0 0 0  ?Easily annoyed or irritable 1 0 0  ?Afraid - awful might happen 0 0 0  ?Total GAD 7 Score '2 1 1  '$ ?Anxiety Difficulty Not difficult at all Not difficult at all Not difficult at all  ? ? ? ?History ?Wesley Fowler has a past medical history of Anxiety, Arthritis, Glaucoma, and History of high blood pressure.  ? ?He has a past surgical history that includes Retinal detachment surgery (2004); Knee arthroscopy (2004); and Laparoscopic inguinal hernia repair (03/15/11).  ? ?His family history includes Lung cancer in his maternal grandfather.He reports that he has quit smoking. He has never used smokeless tobacco. He reports that he does not drink alcohol and does not use drugs. ? ?Current Outpatient Medications  on File Prior to Visit  ?Medication Sig Dispense Refill  ? aspirin EC 325 MG tablet Take 81 mg by mouth daily. Patient takes sporadically    ? buPROPion (WELLBUTRIN XL) 150 MG 24 hr tablet Take 1 tablet (150 mg total) by mouth daily. 90 tablet 3  ? famotidine (PEPCID) 20 MG tablet Take 1 tablet by mouth twice daily 180 tablet 3  ? hydrOXYzine (ATARAX/VISTARIL) 25 MG tablet Take 1 tablet (25 mg total) by mouth 3 (three) times daily as needed (dizziness). 30 tablet 2  ? latanoprost (XALATAN) 0.005 % ophthalmic solution     ? Multiple Vitamin (MULTIVITAMIN WITH MINERALS) TABS tablet Take 1 tablet by mouth daily.    ? Multiple Vitamins-Minerals (EYE VITAMINS PO) Take 1 tablet by mouth daily.    ? ?No current facility-administered medications on file prior to visit.  ? ? ?ROS ?Review of Systems  ?Constitutional:  Negative for fever.  ?Respiratory:  Negative for shortness of breath.   ?Cardiovascular:  Negative for chest pain.  ?Musculoskeletal:  Negative for arthralgias.  ?  Skin:  Negative for rash.  ? ?Objective:  ?BP (!) 146/79   Pulse 69   Temp (!) 96.8 ?F (36 ?C)   Ht '5\' 7"'$  (1.702 m)   Wt 126 lb 9.6 oz (57.4 kg)   SpO2 99%   BMI 19.83 kg/m?  ? ?BP Readings from Last 3 Encounters:  ?08/04/21 (!) 146/79  ?02/04/21 134/72  ?08/03/20 134/79  ? ? ?Wt Readings from Last 3 Encounters:  ?08/04/21 126 lb 9.6 oz (57.4 kg)  ?02/04/21 130 lb 9.6 oz (59.2 kg)  ?08/03/20 127 lb 3.2 oz (57.7 kg)  ? ? ? ?Physical Exam ?Vitals reviewed.  ?Constitutional:   ?   Appearance: He is well-developed.  ?HENT:  ?   Head: Normocephalic and atraumatic.  ?   Right Ear: External ear normal.  ?   Left Ear: External ear normal.  ?   Mouth/Throat:  ?   Pharynx: No oropharyngeal exudate or posterior oropharyngeal erythema.  ?Eyes:  ?   Pupils: Pupils are equal, round, and reactive to light.  ?Cardiovascular:  ?   Rate and Rhythm: Normal rate and regular rhythm.  ?   Heart sounds: No murmur heard. ?Pulmonary:  ?   Effort: No respiratory distress.   ?   Breath sounds: Normal breath sounds.  ?Musculoskeletal:  ?   Cervical back: Normal range of motion and neck supple.  ?Neurological:  ?   Mental Status: He is alert and oriented to person, place, and time.  ? ? ? ? ?Assessment & Plan:  ? ?Wesley Fowler was seen today for annual exam. ? ?Diagnoses and all orders for this visit: ? ?Primary hypertension ? ?Screening for prostate cancer ? ?Anxiety ? ?Other orders ?-     diazepam (VALIUM) 2 MG tablet; Take 1 tablet (2 mg total) by mouth every 12 (twelve) hours as needed for anxiety. ?-     meclizine (ANTIVERT) 25 MG tablet; TAKE 1 TABLET BY MOUTH THREE TIMES DAILY AS NEEDED FOR DIZZINESS ? ? ?Allergies as of 08/04/2021   ? ?   Reactions  ? Flagyl [metronidazole]   ? GI upset  ? Percocet [oxycodone-acetaminophen] Nausea And Vomiting  ? Sulfa Antibiotics Nausea Only  ? ?  ? ?  ?Medication List  ?  ? ?  ? Accurate as of Aug 04, 2021  9:12 PM. If you have any questions, ask your nurse or doctor.  ?  ?  ? ?  ? ?aspirin EC 325 MG tablet ?Take 81 mg by mouth daily. Patient takes sporadically ?  ?buPROPion 150 MG 24 hr tablet ?Commonly known as: WELLBUTRIN XL ?Take 1 tablet (150 mg total) by mouth daily. ?  ?diazepam 2 MG tablet ?Commonly known as: Valium ?Take 1 tablet (2 mg total) by mouth every 12 (twelve) hours as needed for anxiety. ?  ?EYE VITAMINS PO ?Take 1 tablet by mouth daily. ?  ?famotidine 20 MG tablet ?Commonly known as: PEPCID ?Take 1 tablet by mouth twice daily ?  ?hydrOXYzine 25 MG tablet ?Commonly known as: ATARAX ?Take 1 tablet (25 mg total) by mouth 3 (three) times daily as needed (dizziness). ?  ?latanoprost 0.005 % ophthalmic solution ?Commonly known as: XALATAN ?  ?meclizine 25 MG tablet ?Commonly known as: ANTIVERT ?TAKE 1 TABLET BY MOUTH THREE TIMES DAILY AS NEEDED FOR DIZZINESS ?  ?multivitamin with minerals Tabs tablet ?Take 1 tablet by mouth daily. ?  ? ?  ? ? ?Meds ordered this encounter  ?Medications  ? diazepam (VALIUM) 2 MG tablet  ?  Sig: Take 1 tablet  (2 mg total) by mouth every 12 (twelve) hours as needed for anxiety.  ?  Dispense:  60 tablet  ?  Refill:  1  ? meclizine (ANTIVERT) 25 MG tablet  ?  Sig: TAKE 1 TABLET BY MOUTH THREE TIMES DAILY AS NEEDED FOR DIZZINESS  ?  Dispense:  90 tablet  ?  Refill:  1  ? ? ? ? ?Follow-up: Return in about 6 months (around 02/04/2022). ? ?[name], M.D. ?

## 2021-10-11 ENCOUNTER — Ambulatory Visit (INDEPENDENT_AMBULATORY_CARE_PROVIDER_SITE_OTHER): Payer: Medicare Other | Admitting: Nurse Practitioner

## 2021-10-11 ENCOUNTER — Encounter: Payer: Self-pay | Admitting: Nurse Practitioner

## 2021-10-11 VITALS — BP 152/80 | HR 71 | Temp 97.8°F | Resp 20 | Ht 67.0 in | Wt 123.0 lb

## 2021-10-11 DIAGNOSIS — F419 Anxiety disorder, unspecified: Secondary | ICD-10-CM | POA: Diagnosis not present

## 2021-10-11 DIAGNOSIS — I10 Essential (primary) hypertension: Secondary | ICD-10-CM | POA: Diagnosis not present

## 2021-10-11 MED ORDER — BUPROPION HCL ER (XL) 150 MG PO TB24: 300.0000 mg | ORAL_TABLET | Freq: Every day | ORAL | 1 refills | Status: DC

## 2021-10-11 MED ORDER — BUSPIRONE HCL 5 MG PO TABS: 5.0000 mg | ORAL_TABLET | Freq: Two times a day (BID) | ORAL | 1 refills | Status: DC

## 2021-10-11 MED ORDER — BUPROPION HCL ER (XL) 300 MG PO TB24
300.0000 mg | ORAL_TABLET | Freq: Every day | ORAL | 1 refills | Status: DC
Start: 2021-10-11 — End: 2021-10-11

## 2021-10-11 NOTE — Progress Notes (Signed)
Subjective:    Patient ID: Wesley Fowler., male    DOB: 1945/07/05, 76 y.o.   MRN: [id number]   Chief Complaint: Anxiety (Wife has dementia and having a hard time)   Anxiety Patient reports no chest pain, dizziness, palpitations or shortness of breath.     Patient comes in with elevated blood pressure that he says is affected by anxiety. 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 His blood pressure has been in the 150's at home, but sometimes it is in 675'Q systolic.Marland Kitchen He thinks that maybe increasing his Wellbutrin may help. He has valium to take as needed but he is afraid to take it.     10/11/2021    9:04 AM 08/04/2021    9:32 AM 08/03/2020    9:39 AM 08/01/2019    9:42 AM  GAD 7 : Generalized Anxiety Score  Nervous, Anxious, on Edge '1 1 1 1  '$ Control/stop worrying 0 0 0 0  Worry too much - different things 0 0 0 0  Trouble relaxing 0 0 0 0  Restless 0 0 0 0  Easily annoyed or irritable 1 1 0 0  Afraid - awful might happen 0 0 0 0  Total GAD 7 Score '2 2 1 1  '$ Anxiety Difficulty Not difficult at all Not difficult at all Not difficult at all Not difficult at all        Review of Systems  Constitutional:  Negative for diaphoresis.  Eyes:  Negative for pain.  Respiratory:  Negative for shortness of breath.   Cardiovascular:  Negative for chest pain, palpitations and leg swelling.  Gastrointestinal:  Negative for abdominal pain.  Endocrine: Negative for polydipsia.  Skin:  Negative for rash.  Neurological:  Negative for dizziness, weakness and headaches.  Hematological:  Does not bruise/bleed easily.  All other systems reviewed and are negative.      Objective:   Physical Exam Vitals and nursing note reviewed.  Constitutional:      Appearance: Normal appearance. He is well-developed.  Neck:     Thyroid: No thyroid mass or thyromegaly.     Vascular: No carotid bruit or JVD.     Trachea: Phonation normal.  Cardiovascular:     Rate  and Rhythm: Normal rate and regular rhythm.  Pulmonary:     Effort: Pulmonary effort is normal. No respiratory distress.     Breath sounds: Normal breath sounds.  Abdominal:     General: Bowel sounds are normal.     Palpations: Abdomen is soft.     Tenderness: There is no abdominal tenderness.  Musculoskeletal:        General: Normal range of motion.     Cervical back: Normal range of motion and neck supple.  Lymphadenopathy:     Cervical: No cervical adenopathy.  Skin:    General: Skin is warm and dry.  Neurological:     Mental Status: He is alert and oriented to person, place, and time.  Psychiatric:        Behavior: Behavior normal.        Thought Content: Thought content normal.        Judgment: Judgment normal.     BP (!) 161/83   Pulse 71   Temp 97.8 F (36.6 C) (Temporal)   Resp 20   Ht '5\' 7"'$  (1.702 m)   Wt 123 lb (55.8 kg)   SpO2 99%   BMI 19.26  kg/m        Assessment & Plan:   Wesley Fowler. in today with chief complaint of Anxiety (Wife has dementia and having a hard time)   1. Primary hypertension Keep diary of blood pressure  2. Anxiety Increased wellbutrin to '300mg'$  daily Discussed valium and addictive component.- he wanted to increase his amount because it really helps- told him he would have to get from his PCP. Wants  to try something that is not addicting Will try buspar'5mg'$  1 po bid prn Stress management discussed  Meds ordered this encounter  Medications   DISCONTD: buPROPion (WELLBUTRIN XL) 300 MG 24 hr tablet    Sig: Take 1 tablet (300 mg total) by mouth daily.    Dispense:  90 tablet    Refill:  1    Order Specific Question:   Supervising Provider    Answer:   Caryl Pina A [[id number]]   buPROPion (WELLBUTRIN XL) 150 MG 24 hr tablet    Sig: Take 2 tablets (300 mg total) by mouth daily.    Dispense:  180 tablet    Refill:  1    Do not fill the '300mg'$  prescription previously written    Order Specific Question:   Supervising  Provider    Answer:   Caryl Pina A [[id number]]   busPIRone (BUSPAR) 5 MG tablet    Sig: Take 1 tablet (5 mg total) by mouth 2 (two) times daily.    Dispense:  40 tablet    Refill:  1    Order Specific Question:   Supervising Provider    Answer:   Caryl Pina A [[id number]]         The above assessment and management plan was discussed with the patient. The patient verbalized understanding of and has agreed to the management plan. Patient is aware to call the clinic if symptoms persist or worsen. Patient is aware when to return to the clinic for a follow-up visit. Patient educated on when it is appropriate to go to the emergency department.   Mary-Margaret Hassell Done, FNP

## 2021-10-11 NOTE — Patient Instructions (Signed)

## 2021-11-04 ENCOUNTER — Ambulatory Visit (INDEPENDENT_AMBULATORY_CARE_PROVIDER_SITE_OTHER): Payer: Medicare Other | Admitting: Family Medicine

## 2021-11-04 ENCOUNTER — Encounter: Payer: Self-pay | Admitting: Family Medicine

## 2021-11-04 VITALS — BP 158/88 | HR 74 | Temp 97.2°F | Ht 67.0 in | Wt 119.2 lb

## 2021-11-04 DIAGNOSIS — F419 Anxiety disorder, unspecified: Secondary | ICD-10-CM | POA: Diagnosis not present

## 2021-11-04 DIAGNOSIS — F330 Major depressive disorder, recurrent, mild: Secondary | ICD-10-CM | POA: Diagnosis not present

## 2021-11-04 DIAGNOSIS — I10 Essential (primary) hypertension: Secondary | ICD-10-CM

## 2021-11-04 MED ORDER — AMLODIPINE BESYLATE 5 MG PO TABS
5.0000 mg | ORAL_TABLET | Freq: Every day | ORAL | 1 refills | Status: DC
Start: 2021-11-04 — End: 2022-01-13

## 2021-11-04 NOTE — Progress Notes (Signed)
Assessment & Plan:  1. Primary hypertension Uncontrolled - possible due to increase in Wellbutrin. Starting amlodipine. Patient is going to start with 1/2 tablet and monitor his blood pressure. He will increase to the whole tablet if BP readings remain >150/90. Education provided on the DASH diet.  - amLODipine (NORVASC) 5 MG tablet; Take 1 tablet (5 mg total) by mouth daily.  Dispense: 30 tablet; Refill: 1  2-3. Anxiety/Recurrent major depressive episodes, mild ([place]) Patient does not wish to decrease or change Wellbutrin at this time. GeneSight testing completed today to guide further adjustments as he is unsure due to previous failure of Lexapro.    Follow up plan: Return in about 4 weeks (around 12/02/2021) for HTN with me.  Hendricks Limes, MSN, APRN, FNP-C [facility]  Subjective:   Patient ID: Wesley Vandervelden., male    DOB: Apr 09, 1945, 76 y.o.   MRN: [id number]  HPI: Wesley Genet. is a 76 y.o. male presenting on 11/04/2021 for Hypertension (160/100 this morning )  Patient is concerned about elevated blood pressure readings since increasing Wellbutrin from 150 mg to 300 mg a few weeks ago. BP was 150/94 4-5 days ago and 160/100 this morning per his home cuff, which has been compared to ours for accuracy.      10/11/2021    9:02 AM 08/04/2021    9:30 AM 08/04/2021    9:23 AM  Depression screen PHQ 2/9  Decreased Interest 0 0 0  Down, Depressed, Hopeless 3 3 0  PHQ - 2 Score 3 3 0  Altered sleeping 0 0   Tired, decreased energy 0 0   Change in appetite 0 0   Feeling bad or failure about yourself  0 0   Trouble concentrating 0 0   Moving slowly or fidgety/restless 0 0   Suicidal thoughts 0 0   PHQ-9 Score 3 3   Difficult doing work/chores Not difficult at all Not difficult at all       10/11/2021    9:04 AM 08/04/2021    9:32 AM 08/03/2020    9:39 AM 08/01/2019    9:42 AM  GAD 7 : Generalized Anxiety Score  Nervous, Anxious, on Edge '1 1 1 1   '$ Control/stop worrying 0 0 0 0  Worry too much - different things 0 0 0 0  Trouble relaxing 0 0 0 0  Restless 0 0 0 0  Easily annoyed or irritable 1 1 0 0  Afraid - awful might happen 0 0 0 0  Total GAD 7 Score '2 2 1 1  '$ Anxiety Difficulty Not difficult at all Not difficult at all Not difficult at all Not difficult at all    ROS: Negative unless specifically indicated above in HPI.   Relevant past medical history reviewed and updated as indicated.   Allergies and medications reviewed and updated.   Current Outpatient Medications:    aspirin EC 325 MG tablet, Take 81 mg by mouth daily. Patient takes sporadically, Disp: , Rfl:    buPROPion (WELLBUTRIN XL) 150 MG 24 hr tablet, Take 2 tablets (300 mg total) by mouth daily., Disp: 180 tablet, Rfl: 1   busPIRone (BUSPAR) 5 MG tablet, Take 1 tablet (5 mg total) by mouth 2 (two) times daily., Disp: 40 tablet, Rfl: 1   diazepam (VALIUM) 2 MG tablet, Take 1 tablet (2 mg total) by mouth every 12 (twelve) hours as needed for anxiety., Disp: 60 tablet, Rfl: 1   latanoprost (XALATAN) 0.005 %  ophthalmic solution, , Disp: , Rfl:    meclizine (ANTIVERT) 25 MG tablet, TAKE 1 TABLET BY MOUTH THREE TIMES DAILY AS NEEDED FOR DIZZINESS, Disp: 90 tablet, Rfl: 1   Multiple Vitamin (MULTIVITAMIN WITH MINERALS) TABS tablet, Take 1 tablet by mouth daily., Disp: , Rfl:    Multiple Vitamins-Minerals (EYE VITAMINS PO), Take 1 tablet by mouth daily., Disp: , Rfl:    famotidine (PEPCID) 20 MG tablet, Take 1 tablet by mouth twice daily (Patient not taking: Reported on 11/04/2021), Disp: 180 tablet, Rfl: 3   hydrOXYzine (ATARAX/VISTARIL) 25 MG tablet, Take 1 tablet (25 mg total) by mouth 3 (three) times daily as needed (dizziness). (Patient not taking: Reported on 11/04/2021), Disp: 30 tablet, Rfl: 2  Allergies  Allergen Reactions   Flagyl [Metronidazole]     GI upset   Oxycodone-Acetaminophen    Percocet [Oxycodone-Acetaminophen] Nausea And Vomiting   Sulfa  Antibiotics Nausea Only    Objective:   BP (!) 158/88   Pulse 74   Temp (!) 97.2 F (36.2 C) (Temporal)   Ht '5\' 7"'$  (1.702 m)   Wt 119 lb 3.2 oz (54.1 kg)   SpO2 97%   BMI 18.67 kg/m    Physical Exam Vitals reviewed.  Constitutional:      General: He is not in acute distress.    Appearance: Normal appearance. He is not ill-appearing, toxic-appearing or diaphoretic.  HENT:     Head: Normocephalic and atraumatic.  Eyes:     General: No scleral icterus.       Right eye: No discharge.        Left eye: No discharge.     Conjunctiva/sclera: Conjunctivae normal.  Cardiovascular:     Rate and Rhythm: Normal rate and regular rhythm.     Heart sounds: Normal heart sounds. No murmur heard.    No friction rub. No gallop.  Pulmonary:     Effort: Pulmonary effort is normal. No respiratory distress.     Breath sounds: Normal breath sounds. No stridor. No wheezing, rhonchi or rales.  Musculoskeletal:        General: Normal range of motion.     Cervical back: Normal range of motion.  Skin:    General: Skin is warm and dry.  Neurological:     Mental Status: He is alert and oriented to person, place, and time. Mental status is at baseline.  Psychiatric:        Mood and Affect: Mood normal.        Behavior: Behavior normal.        Thought Content: Thought content normal.        Judgment: Judgment normal.

## 2021-11-11 ENCOUNTER — Ambulatory Visit: Payer: Medicare Other | Admitting: Family Medicine

## 2021-11-11 ENCOUNTER — Telehealth: Payer: Self-pay | Admitting: Family Medicine

## 2021-11-11 NOTE — Telephone Encounter (Signed)
Please let patient know I received his GeneSight results and it looks like Pristiq is going to be a good medication for him. If he is agreeable, please send 25 mg once daily.

## 2021-11-12 NOTE — Telephone Encounter (Signed)
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

## 2021-11-12 NOTE — Telephone Encounter (Signed)
Patient aware and states that he will look into the pristiq.  He would like to know if he stays on the Wellbutrin will he have good luck?

## 2021-11-12 NOTE — Telephone Encounter (Signed)
Patient aware and states that he called his insurance and the Pristiq is tier 4 and would cost him $150 a month.  He would like a list of other medications in the middle that he can take.  He will call insurance and look into them before making a decision.

## 2021-11-15 NOTE — Telephone Encounter (Signed)
Pristiq can be purchased for around $30 with a GoodRx discount at [facility] or around $20 with the [employer] if he is interested in either of those options. If not, next level options would be Prozac, Celexa, Lexapro, or Zoloft. There are more, but these would be my preference where to start.

## 2021-11-15 NOTE — Telephone Encounter (Signed)
lmtcb

## 2021-11-15 NOTE — Telephone Encounter (Signed)
Patient aware states that he can not read his copy of gene sight that was sent in the mail to him.  Requesting another copy to be mailed.  Not in chart - do you still have a copy of it.  Patient aware you are out of the office and he will not get a call back until tomorrow.

## 2021-11-16 NOTE — Telephone Encounter (Signed)
Patient aware.

## 2021-11-16 NOTE — Telephone Encounter (Signed)
Reprinted.  New copy put in the mail to him.

## 2021-11-25 ENCOUNTER — Telehealth: Payer: Self-pay | Admitting: Family Medicine

## 2021-11-26 ENCOUNTER — Telehealth: Payer: Self-pay | Admitting: Family Medicine

## 2021-11-29 NOTE — Telephone Encounter (Signed)
Yes it can. It is harmless. Just Put your feet up when it occurs if possible.

## 2021-11-29 NOTE — Telephone Encounter (Signed)
Line Busy.

## 2021-11-30 NOTE — Telephone Encounter (Signed)
Patient aware.

## 2021-12-01 ENCOUNTER — Telehealth: Payer: Self-pay | Admitting: Family Medicine

## 2021-12-01 ENCOUNTER — Ambulatory Visit (INDEPENDENT_AMBULATORY_CARE_PROVIDER_SITE_OTHER): Payer: Medicare Other | Admitting: Family Medicine

## 2021-12-01 ENCOUNTER — Encounter: Payer: Self-pay | Admitting: Family Medicine

## 2021-12-01 VITALS — BP 134/74 | HR 76 | Temp 98.8°F | Ht 67.0 in | Wt 121.0 lb

## 2021-12-01 DIAGNOSIS — L03116 Cellulitis of left lower limb: Secondary | ICD-10-CM | POA: Diagnosis not present

## 2021-12-01 DIAGNOSIS — I10 Essential (primary) hypertension: Secondary | ICD-10-CM | POA: Diagnosis not present

## 2021-12-01 MED ORDER — DOXYCYCLINE HYCLATE 100 MG PO TABS: 100.0000 mg | ORAL_TABLET | Freq: Two times a day (BID) | ORAL | 0 refills | Status: AC

## 2021-12-01 NOTE — Telephone Encounter (Signed)
Patient said he was prescribed doxycycline (VIBRA-TABS) 100 MG tablet and that is it making him sick. Would like to know if something else can be called in instead. Please call back and let him know.

## 2021-12-01 NOTE — Patient Instructions (Signed)

## 2021-12-01 NOTE — Progress Notes (Signed)
Subjective:  Patient ID: Wesley Fowler., male    DOB: [birth year], 76 y.o.   MRN: [id number]  Patient Care Team: [name], MD as PCP - General (Family Medicine)   Chief Complaint:  Wound Infection (Left leg//Bilateral ankle swelling/)   HPI: Wesley Fowler. is a 76 y.o. male presenting on 12/01/2021 for Wound Infection (Left leg//Bilateral ankle swelling/)   Pt presents today for reevaluation of blood pressure after being started on amlodipine. He started at 2.5 mg daily and increased to 5 mg daily because blood pressure was staying in the 140/80 range. Once he increased dosing, he started having bilateral ankle swelling. This improves with elevation of legs. No chest pain, headaches, palpitations, shortness of breath, urinary output changes, orthopnea, or PND. No weakness or confusion.  He also reports a wound to his left lower leg. States he fell causing an abrasion to his leg. Area is now red, warm to touch, and swollen. Td is up to date.       Relevant past medical, surgical, family, and social history reviewed and updated as indicated.  Allergies and medications reviewed and updated. Data reviewed: Chart in Epic.   Past Medical History:  Diagnosis Date   Anxiety    Arthritis    Glaucoma    History of high blood pressure     Past Surgical History:  Procedure Laterality Date   KNEE ARTHROSCOPY  2004   right   LAPAROSCOPIC INGUINAL HERNIA REPAIR  03/15/11   bilateral   RETINAL DETACHMENT SURGERY  2004   right    Social History   Socioeconomic History   Marital status: Married    Spouse name: Not on file   Number of children: Not on file   Years of education: Not on file   Highest education level: Not on file  Occupational History   Not on file  Tobacco Use   Smoking status: Former   Smokeless tobacco: Never   Tobacco comments:    quit 1982  Vaping Use   Vaping Use: Never used  Substance and Sexual Activity   Alcohol use: No   Drug use: No    Sexual activity: Never  Other Topics Concern   Not on file  Social History Narrative   Not on file   Social Determinants of Health   Financial Resource Strain: Not on file  Food Insecurity: Not on file  Transportation Needs: Not on file  Physical Activity: Not on file  Stress: Not on file  Social Connections: Not on file  Intimate Partner Violence: Not on file    Outpatient Encounter Medications as of 12/01/2021  Medication Sig   amLODipine (NORVASC) 5 MG tablet Take 1 tablet (5 mg total) by mouth daily.   aspirin EC 325 MG tablet Take 81 mg by mouth daily. Patient takes sporadically   buPROPion (WELLBUTRIN XL) 150 MG 24 hr tablet Take 2 tablets (300 mg total) by mouth daily. (Patient taking differently: Take 150 mg by mouth daily.)   busPIRone (BUSPAR) 5 MG tablet Take 1 tablet (5 mg total) by mouth 2 (two) times daily.   diazepam (VALIUM) 2 MG tablet Take 1 tablet (2 mg total) by mouth every 12 (twelve) hours as needed for anxiety.   doxycycline (VIBRA-TABS) 100 MG tablet Take 1 tablet (100 mg total) by mouth 2 (two) times daily for 10 days. 1 po bid   famotidine (PEPCID) 20 MG tablet Take 1 tablet by mouth twice daily   hydrOXYzine (  ATARAX/VISTARIL) 25 MG tablet Take 1 tablet (25 mg total) by mouth 3 (three) times daily as needed (dizziness).   latanoprost (XALATAN) 0.005 % ophthalmic solution    meclizine (ANTIVERT) 25 MG tablet TAKE 1 TABLET BY MOUTH THREE TIMES DAILY AS NEEDED FOR DIZZINESS   Multiple Vitamin (MULTIVITAMIN WITH MINERALS) TABS tablet Take 1 tablet by mouth daily.   Multiple Vitamins-Minerals (EYE VITAMINS PO) Take 1 tablet by mouth daily.   No facility-administered encounter medications on file as of 12/01/2021.    Allergies  Allergen Reactions   Flagyl [Metronidazole]     GI upset   Oxycodone-Acetaminophen    Percocet [Oxycodone-Acetaminophen] Nausea And Vomiting   Sulfa Antibiotics Nausea Only    Review of Systems  Constitutional:  Negative for  activity change, appetite change, chills, diaphoresis, fatigue, fever and unexpected weight change.  Respiratory:  Negative for cough, chest tightness and shortness of breath.   Cardiovascular:  Positive for leg swelling. Negative for chest pain and palpitations.  Genitourinary:  Negative for decreased urine volume and difficulty urinating.  Skin:  Positive for color change and wound.  Neurological:  Negative for dizziness, tremors, seizures, syncope, facial asymmetry, speech difficulty, weakness, light-headedness, numbness and headaches.  Psychiatric/Behavioral:  Negative for confusion.   All other systems reviewed and are negative.       Objective:  BP 134/74   Pulse 76   Temp 98.8 F (37.1 C)   Ht 5' 7"  (1.702 m)   Wt 121 lb (54.9 kg)   SpO2 96%   BMI 18.95 kg/m    Wt Readings from Last 3 Encounters:  12/01/21 121 lb (54.9 kg)  11/04/21 119 lb 3.2 oz (54.1 kg)  10/11/21 123 lb (55.8 kg)    Physical Exam Vitals and nursing note reviewed.  Constitutional:      General: He is not in acute distress.    Appearance: Normal appearance. He is well-developed, well-groomed and normal weight. He is not ill-appearing, toxic-appearing or diaphoretic.  HENT:     Head: Normocephalic and atraumatic.     Jaw: There is normal jaw occlusion.     Right Ear: Hearing normal.     Left Ear: Hearing normal.     Nose: Nose normal.     Mouth/Throat:     Lips: Pink.     Mouth: Mucous membranes are moist.     Pharynx: Oropharynx is clear. Uvula midline.  Eyes:     General: Lids are normal.     Extraocular Movements: Extraocular movements intact.     Conjunctiva/sclera: Conjunctivae normal.     Pupils: Pupils are equal, round, and reactive to light.  Neck:     Thyroid: No thyroid mass, thyromegaly or thyroid tenderness.     Vascular: No carotid bruit or JVD.     Trachea: Trachea and phonation normal.  Cardiovascular:     Rate and Rhythm: Normal rate and regular rhythm.     Chest Wall: PMI  is not displaced.     Pulses: Normal pulses.     Heart sounds: Normal heart sounds. No murmur heard.    No friction rub. No gallop.  Pulmonary:     Effort: Pulmonary effort is normal. No respiratory distress.     Breath sounds: Normal breath sounds. No wheezing.  Abdominal:     General: Bowel sounds are normal. There is no distension or abdominal bruit.     Palpations: Abdomen is soft. There is no hepatomegaly or splenomegaly.     Tenderness: There  is no abdominal tenderness. There is no right CVA tenderness or left CVA tenderness.     Hernia: No hernia is present.  Musculoskeletal:        General: Normal range of motion.     Cervical back: Normal range of motion and neck supple.     Right lower leg: 1+ Edema present.     Left lower leg: 1+ Edema present.  Lymphadenopathy:     Cervical: No cervical adenopathy.  Skin:    General: Skin is warm and dry.     Capillary Refill: Capillary refill takes less than 2 seconds.     Coloration: Skin is not cyanotic, jaundiced or pale.     Findings: Erythema and wound present. No rash.       Neurological:     General: No focal deficit present.     Mental Status: He is alert and oriented to person, place, and time.     Sensory: Sensation is intact.     Motor: Motor function is intact.     Coordination: Coordination is intact.     Gait: Gait is intact.     Deep Tendon Reflexes: Reflexes are normal and symmetric.  Psychiatric:        Attention and Perception: Attention and perception normal.        Mood and Affect: Mood and affect normal.        Speech: Speech normal.        Behavior: Behavior normal. Behavior is cooperative.        Thought Content: Thought content normal.        Cognition and Memory: Cognition and memory normal.        Judgment: Judgment normal.     Results for orders placed or performed in visit on 07/21/21  ToxASSURE Select 13 (MW), Urine  Result Value Ref Range   Summary Note   PSA, total and free  Result Value  Ref Range   Prostate Specific Ag, Serum 1.0 0.0 - 4.0 ng/mL   PSA, Free 0.24 N/A ng/mL   PSA, Free Pct 24.0 %  VITAMIN D 25 Hydroxy (Vit-D Deficiency, Fractures)  Result Value Ref Range   Vit D, 25-Hydroxy 72.7 30.0 - 100.0 ng/mL  Lipid panel  Result Value Ref Range   Cholesterol, Total 234 (H) 100 - 199 mg/dL   Triglycerides 57 0 - 149 mg/dL   HDL 103 >39 mg/dL   VLDL Cholesterol Cal 10 5 - 40 mg/dL   LDL Chol Calc (NIH) 121 (H) 0 - 99 mg/dL   Chol/HDL Ratio 2.3 0.0 - 5.0 ratio  CMP14+EGFR  Result Value Ref Range   Glucose 100 (H) 70 - 99 mg/dL   BUN 5 (L) 8 - 27 mg/dL   Creatinine, Ser 0.93 0.76 - 1.27 mg/dL   eGFR 87 >59 mL/min/1.73   BUN/Creatinine Ratio 5 (L) 10 - 24   Sodium 132 (L) 134 - 144 mmol/L   Potassium 4.8 3.5 - 5.2 mmol/L   Chloride 96 96 - 106 mmol/L   CO2 19 (L) 20 - 29 mmol/L   Calcium 9.5 8.6 - 10.2 mg/dL   Total Protein 6.3 6.0 - 8.5 g/dL   Albumin 4.9 (H) 3.7 - 4.7 g/dL   Globulin, Total 1.4 (L) 1.5 - 4.5 g/dL   Albumin/Globulin Ratio 3.5 (H) 1.2 - 2.2   Bilirubin Total 0.6 0.0 - 1.2 mg/dL   Alkaline Phosphatase 98 44 - 121 IU/L   AST 22 0 - 40 IU/L  ALT 17 0 - 44 IU/L  CBC with Differential/Platelet  Result Value Ref Range   WBC 4.7 3.4 - 10.8 x10E3/uL   RBC 4.09 (L) 4.14 - 5.80 x10E6/uL   Hemoglobin 13.4 13.0 - 17.7 g/dL   Hematocrit 37.5 37.5 - 51.0 %   MCV 92 79 - 97 fL   MCH 32.8 26.6 - 33.0 pg   MCHC 35.7 31.5 - 35.7 g/dL   RDW 11.5 (L) 11.6 - 15.4 %   Platelets 235 150 - 450 x10E3/uL   Neutrophils 64 Not Estab. %   Lymphs 22 Not Estab. %   Monocytes 11 Not Estab. %   Eos 2 Not Estab. %   Basos 1 Not Estab. %   Neutrophils Absolute 3.0 1.4 - 7.0 x10E3/uL   Lymphocytes Absolute 1.1 0.7 - 3.1 x10E3/uL   Monocytes Absolute 0.5 0.1 - 0.9 x10E3/uL   EOS (ABSOLUTE) 0.1 0.0 - 0.4 x10E3/uL   Basophils Absolute 0.1 0.0 - 0.2 x10E3/uL   Immature Granulocytes 0 Not Estab. %   Immature Grans (Abs) 0.0 0.0 - 0.1 x10E3/uL       Pertinent  labs & imaging results that were available during my care of the patient were reviewed by me and considered in my medical decision making.  Assessment & Plan:  Odarius was seen today for wound infection.  Diagnoses and all orders for this visit:  Primary hypertension Pt with bilateral ankle swelling with 5 mg dosing of amlodipine. Pt aware to decrease dosing to 2.5 and monitor BP over next 2 weeks, if remains below goal of 150/90, will not increase dosing back to 5 mg. DASH diet discussed in detail. Will recheck renal function today.  -     BMP8+EGFR  Cellulitis of left lower extremity Wound care performed in office and dressing applied. Doxycycline as prescribed. Report new, worsening, or persistent symptoms.  -     doxycycline (VIBRA-TABS) 100 MG tablet; Take 1 tablet (100 mg total) by mouth 2 (two) times daily for 10 days. 1 po bid     Continue all other maintenance medications.  Follow up plan: Return if symptoms worsen or fail to improve.   Continue healthy lifestyle choices, including diet (rich in fruits, vegetables, and lean proteins, and low in salt and simple carbohydrates) and exercise (at least 30 minutes of moderate physical activity daily).  Educational handout given for cellulitis, DASH diet, HTN  The above assessment and management plan was discussed with the patient. The patient verbalized understanding of and has agreed to the management plan. Patient is aware to call the clinic if they develop any new symptoms or if symptoms persist or worsen. Patient is aware when to return to the clinic for a follow-up visit. Patient educated on when it is appropriate to go to the emergency department.   Monia Pouch, FNP-C [facility] [phone]

## 2021-12-01 NOTE — Telephone Encounter (Signed)
Since you saw him and prescribed this today, would you mind responding please? Wesley Fowler

## 2021-12-02 ENCOUNTER — Ambulatory Visit (INDEPENDENT_AMBULATORY_CARE_PROVIDER_SITE_OTHER): Payer: Medicare Other | Admitting: Family Medicine

## 2021-12-02 ENCOUNTER — Ambulatory Visit: Payer: Medicare Other

## 2021-12-02 ENCOUNTER — Encounter: Payer: Self-pay | Admitting: Family Medicine

## 2021-12-02 DIAGNOSIS — U071 COVID-19: Secondary | ICD-10-CM

## 2021-12-02 LAB — BMP8+EGFR
BUN/Creatinine Ratio: 9 — ABNORMAL LOW (ref 10–24)
BUN: 7 mg/dL — ABNORMAL LOW (ref 8–27)
CO2: 25 mmol/L (ref 20–29)
Calcium: 9.5 mg/dL (ref 8.6–10.2)
Chloride: 98 mmol/L (ref 96–106)
Creatinine, Ser: 0.82 mg/dL (ref 0.76–1.27)
Glucose: 98 mg/dL (ref 70–99)
Potassium: 4.3 mmol/L (ref 3.5–5.2)
Sodium: 136 mmol/L (ref 134–144)
eGFR: 93 mL/min/{1.73_m2} (ref >59)

## 2021-12-02 MED ORDER — CEPHALEXIN 500 MG PO CAPS: 500.0000 mg | ORAL_CAPSULE | Freq: Two times a day (BID) | ORAL | 0 refills | Status: DC

## 2021-12-02 MED ORDER — ONDANSETRON HCL 4 MG PO TABS: 4.0000 mg | ORAL_TABLET | Freq: Three times a day (TID) | ORAL | 0 refills | Status: DC | PRN

## 2021-12-02 MED ORDER — MOLNUPIRAVIR EUA 200MG CAPSULE: 4.0000 | ORAL_CAPSULE | Freq: Two times a day (BID) | ORAL | 0 refills | Status: AC

## 2021-12-02 MED ORDER — BENZONATATE 100 MG PO CAPS: 100.0000 mg | ORAL_CAPSULE | Freq: Three times a day (TID) | ORAL | 0 refills | Status: DC | PRN

## 2021-12-02 NOTE — Progress Notes (Signed)
Virtual Visit  Note Due to COVID-19 pandemic this visit was conducted virtually. This visit type was conducted due to national recommendations for restrictions regarding the COVID-19 Pandemic (e.g. social distancing, sheltering in place) in an effort to limit this patient's exposure and mitigate transmission in our community. All issues noted in this document were discussed and addressed.  A physical exam was not performed with this format.  I connected with Wesley Fowler. on 12/02/21 at 1251 by telephone and verified that I am speaking with the correct person using two identifiers. [name]. is currently located at home and his wife is currently with him during the visit. The provider, Gwenlyn Perking, FNP is located in their office at time of visit.  I discussed the limitations, risks, security and privacy concerns of performing an evaluation and management service by telephone and the availability of in person appointments. I also discussed with the patient that there may be a patient responsible charge related to this service. The patient expressed understanding and agreed to proceed.  CC: Covid 19  History and Present Illness:  URI  This is a new problem. The current episode started yesterday. The problem has been gradually worsening. The maximum temperature recorded prior to his arrival was 101 - 101.9 F. Associated symptoms include congestion, coughing, headaches, joint pain, nausea, rhinorrhea, sinus pain, a sore throat and vomiting. Pertinent negatives include no chest pain, diarrhea, dysuria, ear pain, joint swelling, neck pain, plugged ear sensation, rash, sneezing, swollen glands or wheezing. He has tried decongestant and acetaminophen for the symptoms. The treatment provided mild relief.    Review of Systems  HENT:  Positive for congestion, rhinorrhea, sinus pain and sore throat. Negative for ear pain and sneezing.   Respiratory:  Positive for cough. Negative for wheezing.    Cardiovascular:  Negative for chest pain.  Gastrointestinal:  Positive for nausea and vomiting. Negative for diarrhea.  Genitourinary:  Negative for dysuria.  Musculoskeletal:  Positive for joint pain. Negative for neck pain.  Skin:  Negative for rash.  Neurological:  Positive for headaches.     Observations/Objective: Alert and oriented x 3. Able to speak in full sentences without difficulty.   Assessment and Plan: Leiby was seen today for covid positive.  Diagnoses and all orders for this visit:  Positive self-administered antigen test for COVID-19 Molnupiravir as below. Discussed symptomatic care and return precautions. Discussed quarantine and when to seek emergency care.  -     molnupiravir EUA (LAGEVRIO) 200 mg CAPS capsule; Take 4 capsules (800 mg total) by mouth 2 (two) times daily for 5 days. -     benzonatate (TESSALON PERLES) 100 MG capsule; Take 1 capsule (100 mg total) by mouth 3 (three) times daily as needed for cough. -     ondansetron (ZOFRAN) 4 MG tablet; Take 1 tablet (4 mg total) by mouth every 8 (eight) hours as needed for nausea or vomiting.     Follow Up Instructions: As needed.     I discussed the assessment and treatment plan with the patient. The patient was provided an opportunity to ask questions and all were answered. The patient agreed with the plan and demonstrated an understanding of the instructions.   The patient was advised to call back or seek an in-person evaluation if the symptoms worsen or if the condition fails to improve as anticipated.  The above assessment and management plan was discussed with the patient. The patient verbalized understanding of and has agreed to  the management plan. Patient is aware to call the clinic if symptoms persist or worsen. Patient is aware when to return to the clinic for a follow-up visit. Patient educated on when it is appropriate to go to the emergency department.   Time call ended:  1303  I provided 12  minutes of  non face-to-face time during this encounter.    Gwenlyn Perking, FNP

## 2021-12-02 NOTE — Telephone Encounter (Signed)
Patient had phone visit today with [facility]

## 2021-12-02 NOTE — Telephone Encounter (Signed)
Keflex sent in.

## 2021-12-02 NOTE — Telephone Encounter (Signed)
Patient aware and verbalizes understanding and states that he does take it with food.  Also states that he has taken it before and he could not tolerate it.  He took a home covid test lastnight and it was positive.

## 2021-12-02 NOTE — Addendum Note (Signed)
Addended by: Gwenlyn Perking on: 12/02/2021 03:08 PM   Modules accepted: Orders

## 2021-12-02 NOTE — Telephone Encounter (Signed)
Patient notified

## 2021-12-02 NOTE — Telephone Encounter (Signed)
Patient wants Keflex called in, says he can't take doxycycline (VIBRA-TABS) 100 MG tablet

## 2021-12-03 ENCOUNTER — Other Ambulatory Visit: Payer: Self-pay | Admitting: Family Medicine

## 2021-12-03 NOTE — Telephone Encounter (Signed)
Pt called stating that is just ran out of this medicine and needs refill sent in to [facility]. Pt says he still takes this medicine on a PRN basis and says he made Dr Livia Snellen aware of that at his last check up visit but didn't need refills sent in at that time.

## 2022-01-13 ENCOUNTER — Ambulatory Visit (INDEPENDENT_AMBULATORY_CARE_PROVIDER_SITE_OTHER): Payer: Medicare Other | Admitting: Family Medicine

## 2022-01-13 ENCOUNTER — Ambulatory Visit (INDEPENDENT_AMBULATORY_CARE_PROVIDER_SITE_OTHER): Payer: Medicare Other

## 2022-01-13 ENCOUNTER — Encounter: Payer: Self-pay | Admitting: Family Medicine

## 2022-01-13 VITALS — BP 145/79 | Temp 97.9°F | Ht 67.0 in | Wt 121.6 lb

## 2022-01-13 DIAGNOSIS — K219 Gastro-esophageal reflux disease without esophagitis: Secondary | ICD-10-CM | POA: Diagnosis not present

## 2022-01-13 DIAGNOSIS — I10 Essential (primary) hypertension: Secondary | ICD-10-CM

## 2022-01-13 DIAGNOSIS — S20212A Contusion of left front wall of thorax, initial encounter: Secondary | ICD-10-CM

## 2022-01-13 DIAGNOSIS — F419 Anxiety disorder, unspecified: Secondary | ICD-10-CM

## 2022-01-13 DIAGNOSIS — Z13220 Encounter for screening for lipoid disorders: Secondary | ICD-10-CM

## 2022-01-13 MED ORDER — MECLIZINE HCL 25 MG PO TABS: ORAL_TABLET | ORAL | 1 refills | Status: DC

## 2022-01-13 MED ORDER — FAMOTIDINE 20 MG PO TABS: 20.0000 mg | ORAL_TABLET | Freq: Two times a day (BID) | ORAL | 3 refills | Status: DC

## 2022-01-13 MED ORDER — BUSPIRONE HCL 5 MG PO TABS: 5.0000 mg | ORAL_TABLET | Freq: Two times a day (BID) | ORAL | 3 refills | Status: DC

## 2022-01-13 MED ORDER — AMLODIPINE BESYLATE 2.5 MG PO TABS: 2.5000 mg | ORAL_TABLET | Freq: Every day | ORAL | 3 refills | Status: DC

## 2022-01-13 NOTE — Progress Notes (Signed)
Subjective:  Patient ID: Wesley Dolores., male    DOB: Oct 23, 1945  Age: 76 y.o. MRN: [id number]  CC: No chief complaint on file.   HPI Wesley Fowler. presents for concern for poor balance. Wesley Fowler off a tractor running board last week. Balance not like it used to be. Stays half dizzy. Light headed. Denies vertigo. Wesley Fowler out of a boat when his feet got tangled. Hit his left side. Getting better, but requesting rib XR.  Taking valium prn. Doesn't want it anymore. Declines the CSA. Has some at home. Will self-taper. Says sometimes he goes without for weeks.   Cut back on the amlodipine due to swelling. +-    01/13/2022    1:10 PM 12/01/2021   11:16 AM 10/11/2021    9:02 AM  Depression screen PHQ 2/9  Decreased Interest 1 1 0  Down, Depressed, Hopeless 0 1 3  PHQ - 2 Score _0 Altered sleeping 0 0 0  Tired, decreased energy 0 0 0  Change in appetite 0 0 0  Feeling bad or failure about yourself  0 0 0  Trouble concentrating 0 0 0  Moving slowly or fidgety/restless 0 0 0  Suicidal thoughts 0 0 0  PHQ-9 Score _1 Difficult doing work/chores Not difficult at all Not difficult at all Not difficult at all    History Wesley Fowler has a past medical history of Anxiety, Arthritis, Glaucoma, and History of high blood pressure.   He has a past surgical history that includes Retinal detachment surgery (2004); Knee arthroscopy (2004); and Laparoscopic inguinal hernia repair (03/15/11).   His family history includes Lung cancer in his maternal grandfather.He reports that he has quit smoking. He has never used smokeless tobacco. He reports that he does not drink alcohol and does not use drugs.    ROS Review of Systems  Constitutional:  Negative for fever.  Respiratory:  Negative for shortness of breath.   Cardiovascular:  Negative for chest pain.  Musculoskeletal:  Negative for arthralgias.  Skin:  Negative for rash.    Objective:  BP (!) 145/79   Temp 97.9 F (36.6 C)   Ht 5' 7"  (1.702 m)   Wt 121 lb 9.6 oz (55.2 kg)   BMI 19.05 kg/m   BP Readings from Last 3 Encounters:  01/13/22 (!) 145/79  12/01/21 134/74  11/04/21 (!) 158/88    Wt Readings from Last 3 Encounters:  01/13/22 121 lb 9.6 oz (55.2 kg)  12/01/21 121 lb (54.9 kg)  11/04/21 119 lb 3.2 oz (54.1 kg)     Physical Exam Vitals reviewed.  Constitutional:      Appearance: He is well-developed.  HENT:     Head: Normocephalic and atraumatic.     Right Ear: External ear normal.     Left Ear: External ear normal.     Mouth/Throat:     Pharynx: No oropharyngeal exudate or posterior oropharyngeal erythema.  Eyes:     Pupils: Pupils are equal, round, and reactive to light.  Cardiovascular:     Rate and Rhythm: Normal rate and regular rhythm.     Heart sounds: No murmur heard. Pulmonary:     Effort: No respiratory distress.     Breath sounds: Normal breath sounds.  Musculoskeletal:     Cervical back: Normal range of motion and neck supple.  Neurological:     Mental Status: He is alert and oriented to person, place, and time.  Assessment & Plan:   Diagnoses and all orders for this visit:  Primary hypertension -     CBC with Differential/Platelet -     CMP14+EGFR -     amLODipine (NORVASC) 2.5 MG tablet; Take 1 tablet (2.5 mg total) by mouth daily.  Lipid screening -     Lipid panel  Anxiety -     busPIRone (BUSPAR) 5 MG tablet; Take 1 tablet (5 mg total) by mouth 2 (two) times daily.  Gastroesophageal reflux disease -     famotidine (PEPCID) 20 MG tablet; Take 1 tablet (20 mg total) by mouth 2 (two) times daily.  Contusion of rib on left side, initial encounter -     DG Ribs Unilateral W/Chest Left; Future  Other orders -     meclizine (ANTIVERT) 25 MG tablet; TAKE 1 TABLET BY MOUTH THREE TIMES DAILY AS NEEDED FOR DIZZINESS       I have discontinued [name]'s diazepam, benzonatate, ondansetron, cephALEXin, and prochlorperazine. I have also changed his  amLODipine and famotidine. Additionally, I am having him maintain his aspirin EC, latanoprost, multivitamin with minerals, Multiple Vitamins-Minerals (EYE VITAMINS PO), hydrOXYzine, buPROPion, busPIRone, and meclizine.  Allergies as of 01/13/2022       Reactions   Flagyl [metronidazole]    GI upset   Oxycodone-acetaminophen    Percocet [oxycodone-acetaminophen] Nausea And Vomiting   Sulfa Antibiotics Nausea Only        Medication List        Accurate as of January 13, 2022 11:59 PM. If you have any questions, ask your nurse or doctor.          STOP taking these medications    benzonatate 100 MG capsule Commonly known as: Best boy Stopped by: [name], MD   cephALEXin 500 MG capsule Commonly known as: Keflex Stopped by: [name], MD   diazepam 2 MG tablet Commonly known as: Valium Stopped by: [name], MD   ondansetron 4 MG tablet Commonly known as: Zofran Stopped by: [name], MD   prochlorperazine 10 MG tablet Commonly known as: COMPAZINE Stopped by: [name], MD       TAKE these medications    amLODipine 2.5 MG tablet Commonly known as: NORVASC Take 1 tablet (2.5 mg total) by mouth daily. What changed:  medication strength how much to take Changed by: [name], MD   aspirin EC 325 MG tablet Take 81 mg by mouth daily. Patient takes sporadically   buPROPion 150 MG 24 hr tablet Commonly known as: WELLBUTRIN XL Take 2 tablets (300 mg total) by mouth daily. What changed: how much to take   busPIRone 5 MG tablet Commonly known as: BUSPAR Take 1 tablet (5 mg total) by mouth 2 (two) times daily.   EYE VITAMINS PO Take 1 tablet by mouth daily.   famotidine 20 MG tablet Commonly known as: PEPCID Take 1 tablet (20 mg total) by mouth 2 (two) times daily.   hydrOXYzine 25 MG tablet Commonly known as: ATARAX Take 1 tablet (25 mg total) by mouth 3 (three) times daily as needed (dizziness).   latanoprost 0.005 %  ophthalmic solution Commonly known as: XALATAN   meclizine 25 MG tablet Commonly known as: ANTIVERT TAKE 1 TABLET BY MOUTH THREE TIMES DAILY AS NEEDED FOR DIZZINESS   multivitamin with minerals Tabs tablet Take 1 tablet by mouth daily.       Pt. Declines balance program. Says his wife has balance exercises he will follow.  T. States internet told him that amlodipine was causing his dizziness. Also states that his BP goal should be 150/90 rather than 130/80. As a result, it appears we have some differences that can not be reconciled and he will transfer to Ms. Rakes since his wife sees her.   Follow-up: Return if symptoms worsen or fail to improve.  Warren Stacks, M.D. 

## 2022-01-15 ENCOUNTER — Encounter: Payer: Self-pay | Admitting: Family Medicine

## 2022-02-03 ENCOUNTER — Ambulatory Visit: Payer: Medicare Other | Admitting: Family Medicine

## 2022-04-04 HISTORY — PX: KNEE SURGERY: SHX244

## 2022-05-24 ENCOUNTER — Ambulatory Visit: Payer: Medicare Other

## 2022-06-04 ENCOUNTER — Other Ambulatory Visit: Payer: Self-pay | Admitting: Family Medicine

## 2022-06-07 ENCOUNTER — Ambulatory Visit (INDEPENDENT_AMBULATORY_CARE_PROVIDER_SITE_OTHER): Payer: Medicare Other | Admitting: Family Medicine

## 2022-06-07 ENCOUNTER — Encounter: Payer: Self-pay | Admitting: Family Medicine

## 2022-06-07 VITALS — BP 133/77 | HR 88 | Temp 98.4°F | Ht 67.0 in | Wt 127.4 lb

## 2022-06-07 DIAGNOSIS — F419 Anxiety disorder, unspecified: Secondary | ICD-10-CM

## 2022-06-07 DIAGNOSIS — I10 Essential (primary) hypertension: Secondary | ICD-10-CM | POA: Diagnosis not present

## 2022-06-07 DIAGNOSIS — R42 Dizziness and giddiness: Secondary | ICD-10-CM | POA: Diagnosis not present

## 2022-06-07 DIAGNOSIS — E559 Vitamin D deficiency, unspecified: Secondary | ICD-10-CM | POA: Diagnosis not present

## 2022-06-07 DIAGNOSIS — Z125 Encounter for screening for malignant neoplasm of prostate: Secondary | ICD-10-CM

## 2022-06-07 MED ORDER — MECLIZINE HCL 25 MG PO TABS: ORAL_TABLET | ORAL | 1 refills | Status: DC

## 2022-06-07 MED ORDER — BUPROPION HCL ER (XL) 150 MG PO TB24: 300.0000 mg | ORAL_TABLET | Freq: Every day | ORAL | 2 refills | Status: DC

## 2022-06-07 MED ORDER — BUSPIRONE HCL 5 MG PO TABS: 5.0000 mg | ORAL_TABLET | Freq: Two times a day (BID) | ORAL | 2 refills | Status: DC

## 2022-06-07 MED ORDER — AMLODIPINE BESYLATE 2.5 MG PO TABS: 2.5000 mg | ORAL_TABLET | Freq: Two times a day (BID) | ORAL | 2 refills | Status: DC

## 2022-06-07 NOTE — Addendum Note (Signed)
Addended by: Karle Plumber on: 06/07/2022 03:48 PM   Modules accepted: Orders

## 2022-06-07 NOTE — Progress Notes (Signed)
Subjective:  Patient ID: Wesley Fowler., male    DOB: April 07, 1945, 77 y.o.   MRN: [id number]  Patient Care Team: Baruch Gouty, FNP as PCP - General (Family Medicine)   Chief Complaint:  Establish Care (Dr. Livia Snellen ) and Medical Management of Chronic Issues   HPI: Wesley Fowler. is a 77 y.o. male presenting on 06/07/2022 for Establish Care (Dr. Livia Snellen ) and Medical Management of Chronic Issues   1. Anxiety Currently on Wellbutrin and Buspar and tolerating well. He was on Valium in the past, but has been off for several months. Aware this will not be restarted.     01/13/2022    1:11 PM 12/01/2021   11:17 AM 10/11/2021    9:04 AM 08/04/2021    9:32 AM  GAD 7 : Generalized Anxiety Score  Nervous, Anxious, on Edge '1 1 1 1  '$ Control/stop worrying 0 0 0 0  Worry too much - different things 0 0 0 0  Trouble relaxing 0 0 0 0  Restless 0 0 0 0  Easily annoyed or irritable '1 1 1 1  '$ Afraid - awful might happen 0 0 0 0  Total GAD 7 Score '2 2 2 2  '$ Anxiety Difficulty Not difficult at all Not difficult at all Not difficult at all Not difficult at all       01/13/2022    1:10 PM 12/01/2021   11:16 AM 10/11/2021    9:02 AM 08/04/2021    9:30 AM 08/04/2021    9:23 AM  Depression screen PHQ 2/9  Decreased Interest 1 1 0 0 0  Down, Depressed, Hopeless 0 '1 3 3 '$ 0  PHQ - 2 Score '1 2 3 3 '$ 0  Altered sleeping 0 0 0 0   Tired, decreased energy 0 0 0 0   Change in appetite 0 0 0 0   Feeling bad or failure about yourself  0 0 0 0   Trouble concentrating 0 0 0 0   Moving slowly or fidgety/restless 0 0 0 0   Suicidal thoughts 0 0 0 0   PHQ-9 Score '1 2 3 3   '$ Difficult doing work/chores Not difficult at all Not difficult at all Not difficult at all Not difficult at all     2. Primary hypertension Has been taking amlodipine 2.5 mg twice daily. Denies leg swelling, chest pain, palpitations, weakness, confusion, headaches, or visual changes. Does watch sodium intake. Does not exercise on a regular  basis.   3. Vertigo Does well on as needed Meclizine. This has been an ongoing problem for years, no new or worsening symptoms.   4. Vitamin D deficiency Pt is taking oral repletion therapy. Denies bone pain and tenderness, muscle weakness, fracture, and difficulty walking. Lab Results  Component Value Date   VD25OH 72.7 07/21/2021   VD25OH 38.9 06/25/2019   VD25OH 53 06/07/2018   Lab Results  Component Value Date   CALCIUM 9.5 12/01/2021       Relevant past medical, surgical, family, and social history reviewed and updated as indicated.  Allergies and medications reviewed and updated. Data reviewed: Chart in Epic.   Past Medical History:  Diagnosis Date   Anxiety    Arthritis    Glaucoma    History of high blood pressure     Past Surgical History:  Procedure Laterality Date   KNEE ARTHROSCOPY  2004   right   LAPAROSCOPIC INGUINAL HERNIA REPAIR  03/15/11   bilateral  RETINAL DETACHMENT SURGERY  2004   right    Social History   Socioeconomic History   Marital status: Married    Spouse name: Not on file   Number of children: Not on file   Years of education: Not on file   Highest education level: Not on file  Occupational History   Not on file  Tobacco Use   Smoking status: Former   Smokeless tobacco: Never   Tobacco comments:    quit 1982  Vaping Use   Vaping Use: Never used  Substance and Sexual Activity   Alcohol use: No   Drug use: No   Sexual activity: Never  Other Topics Concern   Not on file  Social History Narrative   Not on file   Social Determinants of Health   Financial Resource Strain: Not on file  Food Insecurity: Not on file  Transportation Needs: Not on file  Physical Activity: Not on file  Stress: Not on file  Social Connections: Not on file  Intimate Partner Violence: Not on file    Outpatient Encounter Medications as of 06/07/2022  Medication Sig   aspirin EC 325 MG tablet Take 81 mg by mouth daily. Patient takes  sporadically   famotidine (PEPCID) 20 MG tablet Take 1 tablet (20 mg total) by mouth 2 (two) times daily.   latanoprost (XALATAN) 0.005 % ophthalmic solution    Multiple Vitamin (MULTIVITAMIN WITH MINERALS) TABS tablet Take 1 tablet by mouth daily.   Multiple Vitamins-Minerals (EYE VITAMINS PO) Take 1 tablet by mouth daily.   [DISCONTINUED] amLODipine (NORVASC) 2.5 MG tablet Take 1 tablet (2.5 mg total) by mouth daily. (Patient taking differently: Take 2.5 mg by mouth in the morning and at bedtime.)   [DISCONTINUED] buPROPion (WELLBUTRIN XL) 150 MG 24 hr tablet Take 2 tablets (300 mg total) by mouth daily. (Patient taking differently: Take 150 mg by mouth daily.)   [DISCONTINUED] busPIRone (BUSPAR) 5 MG tablet Take 1 tablet (5 mg total) by mouth 2 (two) times daily.   [DISCONTINUED] meclizine (ANTIVERT) 25 MG tablet TAKE 1 TABLET BY MOUTH THREE TIMES DAILY AS NEEDED FOR DIZZINESS   amLODipine (NORVASC) 2.5 MG tablet Take 1 tablet (2.5 mg total) by mouth in the morning and at bedtime.   buPROPion (WELLBUTRIN XL) 150 MG 24 hr tablet Take 2 tablets (300 mg total) by mouth daily.   busPIRone (BUSPAR) 5 MG tablet Take 1 tablet (5 mg total) by mouth 2 (two) times daily.   meclizine (ANTIVERT) 25 MG tablet TAKE 1 TABLET BY MOUTH THREE TIMES DAILY AS NEEDED FOR DIZZINESS   [DISCONTINUED] hydrOXYzine (ATARAX/VISTARIL) 25 MG tablet Take 1 tablet (25 mg total) by mouth 3 (three) times daily as needed (dizziness).   No facility-administered encounter medications on file as of 06/07/2022.    Allergies  Allergen Reactions   Flagyl [Metronidazole]     GI upset   Oxycodone-Acetaminophen    Percocet [Oxycodone-Acetaminophen] Nausea And Vomiting   Sulfa Antibiotics Nausea Only    Review of Systems  Constitutional:  Positive for activity change. Negative for appetite change, chills, diaphoresis, fatigue, fever and unexpected weight change.  HENT: Negative.    Eyes: Negative.  Negative for photophobia and  visual disturbance.  Respiratory:  Negative for cough, chest tightness and shortness of breath.   Cardiovascular:  Negative for chest pain, palpitations and leg swelling.  Gastrointestinal:  Negative for abdominal pain, blood in stool, constipation, diarrhea, nausea and vomiting.  Endocrine: Negative.   Genitourinary:  Negative  for decreased urine volume, difficulty urinating, dysuria, frequency and urgency.       Nocturia  Musculoskeletal:  Negative for arthralgias and myalgias.  Skin: Negative.   Allergic/Immunologic: Negative.   Neurological:  Negative for dizziness, tremors, seizures, syncope, facial asymmetry, speech difficulty, weakness, light-headedness, numbness and headaches.  Hematological: Negative.   Psychiatric/Behavioral:  Positive for agitation. Negative for behavioral problems, confusion, decreased concentration, dysphoric mood, hallucinations, self-injury, sleep disturbance and suicidal ideas. The patient is nervous/anxious. The patient is not hyperactive.   All other systems reviewed and are negative.       Objective:  BP 133/77   Pulse 88   Temp 98.4 F (36.9 C) (Temporal)   Ht '5\' 7"'$  (1.702 m)   Wt 127 lb 6.4 oz (57.8 kg)   SpO2 97%   BMI 19.95 kg/m    Wt Readings from Last 3 Encounters:  06/07/22 127 lb 6.4 oz (57.8 kg)  01/13/22 121 lb 9.6 oz (55.2 kg)  12/01/21 121 lb (54.9 kg)    Physical Exam Vitals and nursing note reviewed.  Constitutional:      General: He is not in acute distress.    Appearance: Normal appearance. He is well-developed and well-groomed. He is not ill-appearing, toxic-appearing or diaphoretic.  HENT:     Head: Normocephalic and atraumatic.     Jaw: There is normal jaw occlusion.     Right Ear: Hearing normal.     Left Ear: Hearing normal.     Nose: Nose normal.     Mouth/Throat:     Lips: Pink.     Mouth: Mucous membranes are moist.     Pharynx: Oropharynx is clear. Uvula midline.  Eyes:     General: Lids are normal.      Extraocular Movements: Extraocular movements intact.     Conjunctiva/sclera: Conjunctivae normal.     Pupils: Pupils are equal, round, and reactive to light.  Neck:     Thyroid: No thyroid mass, thyromegaly or thyroid tenderness.     Vascular: No carotid bruit or JVD.     Trachea: Trachea and phonation normal.  Cardiovascular:     Rate and Rhythm: Normal rate and regular rhythm.     Chest Wall: PMI is not displaced.     Pulses: Normal pulses.     Heart sounds: Normal heart sounds. No murmur heard.    No friction rub. No gallop.  Pulmonary:     Effort: Pulmonary effort is normal. No respiratory distress.     Breath sounds: Normal breath sounds. No wheezing.  Abdominal:     General: Bowel sounds are normal. There is no distension or abdominal bruit.     Palpations: Abdomen is soft. There is no hepatomegaly or splenomegaly.     Tenderness: There is no abdominal tenderness. There is no right CVA tenderness or left CVA tenderness.     Hernia: No hernia is present.  Musculoskeletal:        General: Normal range of motion.     Cervical back: Normal range of motion and neck supple.     Right lower leg: No edema.     Left lower leg: No edema.  Lymphadenopathy:     Cervical: No cervical adenopathy.  Skin:    General: Skin is warm and dry.     Capillary Refill: Capillary refill takes less than 2 seconds.     Coloration: Skin is not cyanotic, jaundiced or pale.     Findings: No rash.  Neurological:  General: No focal deficit present.     Mental Status: He is alert and oriented to person, place, and time.     Sensory: Sensation is intact.     Motor: Motor function is intact.     Coordination: Coordination is intact.     Gait: Gait is intact.     Deep Tendon Reflexes: Reflexes are normal and symmetric.  Psychiatric:        Attention and Perception: Attention and perception normal.        Mood and Affect: Mood and affect normal.        Speech: Speech normal.        Behavior: Behavior  normal. Behavior is cooperative.        Thought Content: Thought content normal.        Cognition and Memory: Cognition and memory normal.        Judgment: Judgment normal.     Results for orders placed or performed in visit on 12/01/21  BMP8+EGFR  Result Value Ref Range   Glucose 98 70 - 99 mg/dL   BUN 7 (L) 8 - 27 mg/dL   Creatinine, Ser 0.82 0.76 - 1.27 mg/dL   eGFR 93 >59 mL/min/1.73   BUN/Creatinine Ratio 9 (L) 10 - 24   Sodium 136 134 - 144 mmol/L   Potassium 4.3 3.5 - 5.2 mmol/L   Chloride 98 96 - 106 mmol/L   CO2 25 20 - 29 mmol/L   Calcium 9.5 8.6 - 10.2 mg/dL       Pertinent labs & imaging results that were available during my care of the patient were reviewed by me and considered in my medical decision making.  Assessment & Plan:  Wesley Fowler was seen today for establish care and medical management of chronic issues.  Diagnoses and all orders for this visit:  Vertigo Has been dong well on below. Will make sure sodium level and blood count are normal.  -     meclizine (ANTIVERT) 25 MG tablet; TAKE 1 TABLET BY MOUTH THREE TIMES DAILY AS NEEDED FOR DIZZINESS -     BMP8+EGFR -     CBC with Differential/Platelet  Anxiety Doing well on below. Will continue.  -     buPROPion (WELLBUTRIN XL) 150 MG 24 hr tablet; Take 2 tablets (300 mg total) by mouth daily. -     busPIRone (BUSPAR) 5 MG tablet; Take 1 tablet (5 mg total) by mouth 2 (two) times daily. -     BMP8+EGFR -     CBC with Differential/Platelet -     Thyroid Panel With TSH  Primary hypertension BP well controlled. Changes were not made in regimen today. Goal BP is 130/80. Pt aware to report any persistent high or low readings. DASH diet and exercise encouraged. Exercise at least 150 minutes per week and increase as tolerated. Goal BMI > 25. Stress management encouraged. Avoid nicotine and tobacco product use. Avoid excessive alcohol and NSAID's. Avoid more than 2000 mg of sodium daily. Medications as prescribed.  Follow up as scheduled.  -     amLODipine (NORVASC) 2.5 MG tablet; Take 1 tablet (2.5 mg total) by mouth in the morning and at bedtime. -     BMP8+EGFR -     CBC with Differential/Platelet -     Lipid panel -     Thyroid Panel With TSH  Vitamin D deficiency Labs pending. Continue repletion therapy. If indicated, will change repletion dosage. Eat foods rich in Vit D including  milk, orange juice, yogurt with vitamin D added, salmon or mackerel, canned tuna fish, cereals with vitamin D added, and cod liver oil. Get out in the sun but make sure to wear at least SPF 30 sunscreen.  -     VITAMIN D 25 Hydroxy (Vit-D Deficiency, Fractures)  Screening for prostate cancer -     PSA, total and free     Continue all other maintenance medications.  Follow up plan: Return in about 3 months (around 09/07/2022) for CPE.   Continue healthy lifestyle choices, including diet (rich in fruits, vegetables, and lean proteins, and low in salt and simple carbohydrates) and exercise (at least 30 minutes of moderate physical activity daily).  Educational handout given for DASH diet, HTN  The above assessment and management plan was discussed with the patient. The patient verbalized understanding of and has agreed to the management plan. Patient is aware to call the clinic if they develop any new symptoms or if symptoms persist or worsen. Patient is aware when to return to the clinic for a follow-up visit. Patient educated on when it is appropriate to go to the emergency department.   Monia Pouch, FNP-C [facility] [phone]

## 2022-06-07 NOTE — Patient Instructions (Signed)

## 2022-06-08 LAB — CBC WITH DIFFERENTIAL/PLATELET
Basophils Absolute: 0.1 10*3/uL (ref 0.0–0.2)
Basos: 1 %
EOS (ABSOLUTE): 0.1 10*3/uL (ref 0.0–0.4)
Eos: 1 %
Hematocrit: 33.1 % — ABNORMAL LOW (ref 37.5–51.0)
Hemoglobin: 11.6 g/dL — ABNORMAL LOW (ref 13.0–17.7)
Immature Grans (Abs): 0 10*3/uL (ref 0.0–0.1)
Immature Granulocytes: 0 %
Lymphocytes Absolute: 1.4 10*3/uL (ref 0.7–3.1)
Lymphs: 15 %
MCH: 32 pg (ref 26.6–33.0)
MCHC: 35 g/dL (ref 31.5–35.7)
MCV: 91 fL (ref 79–97)
Monocytes Absolute: 1 10*3/uL — ABNORMAL HIGH (ref 0.1–0.9)
Monocytes: 10 %
Neutrophils Absolute: 6.9 10*3/uL (ref 1.4–7.0)
Neutrophils: 73 %
Platelets: 307 10*3/uL (ref 150–450)
RBC: 3.62 x10E6/uL — ABNORMAL LOW (ref 4.14–5.80)
RDW: 11.1 % — ABNORMAL LOW (ref 11.6–15.4)
WBC: 9.5 10*3/uL (ref 3.4–10.8)

## 2022-06-13 LAB — THYROID PANEL WITH TSH
Free Thyroxine Index: 1.8 (ref 1.2–4.9)
T3 Uptake Ratio: 24 % (ref 24–39)
T4, Total: 7.3 ug/dL (ref 4.5–12.0)
TSH: 0.869 u[IU]/mL (ref 0.450–4.500)

## 2022-06-13 LAB — CBC WITH DIFFERENTIAL/PLATELET

## 2022-06-13 LAB — LIPID PANEL
Chol/HDL Ratio: 2.3 ratio (ref 0.0–5.0)
Cholesterol, Total: 183 mg/dL (ref 100–199)
HDL: 79 mg/dL (ref >39)
LDL Chol Calc (NIH): 92 mg/dL (ref 0–99)
Triglycerides: 66 mg/dL (ref 0–149)
VLDL Cholesterol Cal: 12 mg/dL (ref 5–40)

## 2022-06-13 LAB — VITAMIN D 25 HYDROXY (VIT D DEFICIENCY, FRACTURES): Vit D, 25-Hydroxy: 87.6 ng/mL (ref 30.0–100.0)

## 2022-06-13 LAB — BMP8+EGFR
BUN/Creatinine Ratio: 9 — ABNORMAL LOW (ref 10–24)
BUN: 8 mg/dL (ref 8–27)
CO2: 23 mmol/L (ref 20–29)
Calcium: 9.4 mg/dL (ref 8.6–10.2)
Chloride: 97 mmol/L (ref 96–106)
Creatinine, Ser: 0.92 mg/dL (ref 0.76–1.27)
Glucose: 92 mg/dL (ref 70–99)
Potassium: 4.9 mmol/L (ref 3.5–5.2)
Sodium: 135 mmol/L (ref 134–144)
eGFR: 87 mL/min/{1.73_m2} (ref >59)

## 2022-06-13 LAB — PSA, TOTAL AND FREE
PSA, Free Pct: 23.6 %
PSA, Free: 0.26 ng/mL
Prostate Specific Ag, Serum: 1.1 ng/mL (ref 0.0–4.0)

## 2022-08-08 ENCOUNTER — Encounter (INDEPENDENT_AMBULATORY_CARE_PROVIDER_SITE_OTHER): Payer: Medicare Other | Admitting: Ophthalmology

## 2022-08-09 ENCOUNTER — Encounter: Payer: Medicare Other | Admitting: Family Medicine

## 2022-08-30 ENCOUNTER — Other Ambulatory Visit: Payer: Self-pay | Admitting: Family Medicine

## 2022-08-30 ENCOUNTER — Telehealth: Payer: Self-pay | Admitting: Family Medicine

## 2022-08-30 DIAGNOSIS — I10 Essential (primary) hypertension: Secondary | ICD-10-CM

## 2022-08-30 MED ORDER — ENALAPRIL MALEATE 5 MG PO TABS: 5.0000 mg | ORAL_TABLET | Freq: Every day | ORAL | 6 refills | Status: DC

## 2022-08-30 NOTE — Telephone Encounter (Signed)
Patient aware and verbalizes understanding.  6 week appointment scheduled.

## 2022-09-05 ENCOUNTER — Other Ambulatory Visit: Payer: Self-pay

## 2022-09-05 ENCOUNTER — Emergency Department (HOSPITAL_BASED_OUTPATIENT_CLINIC_OR_DEPARTMENT_OTHER)
Admission: EM | Admit: 2022-09-05 | Discharge: 2022-09-05 | Disposition: A | Discharge status: Home / Self Care | Payer: Medicare Other | Attending: Emergency Medicine | Admitting: Emergency Medicine

## 2022-09-05 ENCOUNTER — Encounter (HOSPITAL_BASED_OUTPATIENT_CLINIC_OR_DEPARTMENT_OTHER): Payer: Self-pay | Admitting: Emergency Medicine

## 2022-09-05 ENCOUNTER — Emergency Department (HOSPITAL_BASED_OUTPATIENT_CLINIC_OR_DEPARTMENT_OTHER): Payer: Medicare Other

## 2022-09-05 DIAGNOSIS — Z7982 Long term (current) use of aspirin: Secondary | ICD-10-CM | POA: Insufficient documentation

## 2022-09-05 DIAGNOSIS — D649 Anemia, unspecified: Secondary | ICD-10-CM | POA: Insufficient documentation

## 2022-09-05 DIAGNOSIS — E871 Hypo-osmolality and hyponatremia: Secondary | ICD-10-CM | POA: Diagnosis not present

## 2022-09-05 DIAGNOSIS — G4486 Cervicogenic headache: Secondary | ICD-10-CM | POA: Diagnosis not present

## 2022-09-05 DIAGNOSIS — I10 Essential (primary) hypertension: Secondary | ICD-10-CM | POA: Diagnosis not present

## 2022-09-05 DIAGNOSIS — R519 Headache, unspecified: Secondary | ICD-10-CM | POA: Diagnosis present

## 2022-09-05 LAB — BASIC METABOLIC PANEL
Anion gap: 6 (ref 5–15)
BUN: 10 mg/dL (ref 8–23)
CO2: 28 mmol/L (ref 22–32)
Calcium: 8.9 mg/dL (ref 8.9–10.3)
Chloride: 95 mmol/L — ABNORMAL LOW (ref 98–111)
Creatinine, Ser: 0.77 mg/dL (ref 0.61–1.24)
GFR, Estimated: >60 mL/min (ref >60)
Glucose, Bld: 96 mg/dL (ref 70–99)
Potassium: 3.9 mmol/L (ref 3.5–5.1)
Sodium: 129 mmol/L — ABNORMAL LOW (ref 135–145)

## 2022-09-05 LAB — CBC WITH DIFFERENTIAL/PLATELET
Abs Immature Granulocytes: 0.02 10*3/uL (ref 0.00–0.07)
Basophils Absolute: 0.1 10*3/uL (ref 0.0–0.1)
Basophils Relative: 1 %
Eosinophils Absolute: 0 10*3/uL (ref 0.0–0.5)
Eosinophils Relative: 1 %
HCT: 33.4 % — ABNORMAL LOW (ref 39.0–52.0)
Hemoglobin: 12.2 g/dL — ABNORMAL LOW (ref 13.0–17.0)
Immature Granulocytes: 0 %
Lymphocytes Relative: 21 %
Lymphs Abs: 1 10*3/uL (ref 0.7–4.0)
MCH: 32.2 pg (ref 26.0–34.0)
MCHC: 36.5 g/dL — ABNORMAL HIGH (ref 30.0–36.0)
MCV: 88.1 fL (ref 80.0–100.0)
Monocytes Absolute: 0.4 10*3/uL (ref 0.1–1.0)
Monocytes Relative: 9 %
Neutro Abs: 3.3 10*3/uL (ref 1.7–7.7)
Neutrophils Relative %: 68 %
Platelets: 280 10*3/uL (ref 150–400)
RBC: 3.79 MIL/uL — ABNORMAL LOW (ref 4.22–5.81)
RDW: 11.8 % (ref 11.5–15.5)
WBC: 4.8 10*3/uL (ref 4.0–10.5)
nRBC: 0 % (ref 0.0–0.2)

## 2022-09-05 NOTE — ED Notes (Signed)
Patient verbalizes understanding of discharge instructions. Opportunity for questioning and answers were provided. Patient discharged from ED.  °

## 2022-09-05 NOTE — ED Provider Notes (Signed)
[facility]PARTMENT AT [facility] Provider Note   CSN: [id number] Arrival date & time: 09/05/22  1222     History  Chief Complaint  Patient presents with   Headache   Eye Pain    Wesley Tirey. is a 77 y.o. male with PMH anxiety, HTN who presents to ED c/o abrupt onset right sided frontal headache and eye pain at 10am this morning. Reports he took 2 Tylenol with relief of pain but was concerned due to intensity of headache and abrupt onset so came to ED for further evaluation. No visual disturbance, focal weakness. No history of similar headaches. Notes right trapezius muscle pain on waking this morning which was unusual. No fever, chills, nausea, vomiting, abdominal pain, chest pain, shortness of breath, paresthesias, or other complaints. No family history neurological diseases. No fall or trauma to head/neck.       Home Medications Prior to Admission medications   Medication Sig Start Date End Date Taking? Authorizing Provider  aspirin EC 325 MG tablet Take 81 mg by mouth daily. Patient takes sporadically    [provider]  buPROPion (WELLBUTRIN XL) 150 MG 24 hr tablet Take 2 tablets (300 mg total) by mouth daily. 06/07/22   Sonny Masters, FNP  busPIRone (BUSPAR) 5 MG tablet Take 1 tablet (5 mg total) by mouth 2 (two) times daily. 06/07/22   Sonny Masters, FNP  enalapril (VASOTEC) 5 MG tablet Take 1 tablet (5 mg total) by mouth daily. 08/30/22   Sonny Masters, FNP  famotidine (PEPCID) 20 MG tablet Take 1 tablet (20 mg total) by mouth 2 (two) times daily. 01/13/22   Mechele Claude, MD  latanoprost (XALATAN) 0.005 % ophthalmic solution  06/10/16   [provider]  meclizine (ANTIVERT) 25 MG tablet TAKE 1 TABLET BY MOUTH THREE TIMES DAILY AS NEEDED FOR DIZZINESS 06/07/22   Sonny Masters, FNP  Multiple Vitamin (MULTIVITAMIN WITH MINERALS) TABS tablet Take 1 tablet by mouth daily.    [provider]  Multiple Vitamins-Minerals (EYE VITAMINS  PO) Take 1 tablet by mouth daily.    [provider]      Allergies    Flagyl [metronidazole], Oxycodone-acetaminophen, Percocet [oxycodone-acetaminophen], and Sulfa antibiotics    Review of Systems   Review of Systems  All other systems reviewed and are negative.   Physical Exam Updated Vital Signs BP (!) 163/93 (BP Location: Right Arm)   Pulse 85   Temp 98.3 F (36.8 C) (Oral)   Resp 18   Ht 5\' 9"  (1.753 m)   Wt 59 kg   SpO2 97%   BMI 19.20 kg/m  Physical Exam Vitals and nursing note reviewed.  Constitutional:      General: He is not in acute distress.    Appearance: Normal appearance. He is not ill-appearing or toxic-appearing.  HENT:     Head: Normocephalic and atraumatic.     Mouth/Throat:     Mouth: Mucous membranes are moist.  Eyes:     Extraocular Movements: Extraocular movements intact.     Conjunctiva/sclera: Conjunctivae normal.     Pupils: Pupils are equal, round, and reactive to light.  Cardiovascular:     Rate and Rhythm: Normal rate and regular rhythm.     Heart sounds: No murmur heard. Pulmonary:     Effort: Pulmonary effort is normal.     Breath sounds: Normal breath sounds.  Abdominal:     General: Abdomen is flat.     Palpations: Abdomen  is soft.     Tenderness: There is no abdominal tenderness.  Musculoskeletal:        General: Normal range of motion.     Cervical back: Normal range of motion and neck supple. No rigidity.     Right lower leg: No edema.     Left lower leg: No edema.  Skin:    General: Skin is warm and dry.     Capillary Refill: Capillary refill takes less than 2 seconds.  Neurological:     Mental Status: He is alert and oriented to person, place, and time.     GCS: GCS eye subscore is 4. GCS verbal subscore is 5. GCS motor subscore is 6.     Cranial Nerves: No cranial nerve deficit, dysarthria or facial asymmetry.     Sensory: No sensory deficit.  Psychiatric:        Mood and Affect: Mood normal.        Speech:  Speech normal.        Behavior: Behavior normal.     ED Results / Procedures / Treatments   Labs (all labs ordered are listed, but only abnormal results are displayed) Labs Reviewed  CBC WITH DIFFERENTIAL/PLATELET - Abnormal; Notable for the following components:      Result Value   RBC 3.79 (*)    Hemoglobin 12.2 (*)    HCT 33.4 (*)    MCHC 36.5 (*)    All other components within normal limits  BASIC METABOLIC PANEL - Abnormal; Notable for the following components:   Sodium 129 (*)    Chloride 95 (*)    All other components within normal limits    EKG None  Radiology CT Head Wo Contrast  Result Date: 09/05/2022 CLINICAL DATA:  Neck pain, acute, no red flags; Headache, new onset (Age >= 51y) EXAM: CT HEAD WITHOUT CONTRAST CT CERVICAL SPINE WITHOUT CONTRAST TECHNIQUE: Multidetector CT imaging of the head and cervical spine was performed following the standard protocol without intravenous contrast. Multiplanar CT image reconstructions of the cervical spine were also generated. RADIATION DOSE REDUCTION: This exam was performed according to the departmental dose-optimization program which includes automated exposure control, adjustment of the mA and/or kV according to patient size and/or use of iterative reconstruction technique. COMPARISON:  MR Head 11/08/12, CT head 11/08/12 FINDINGS: CT HEAD FINDINGS Brain: No evidence of acute infarction, hemorrhage, hydrocephalus, extra-axial collection or mass lesion/mass effect. Sequela of moderate chronic microvascular ischemic change. Vascular: No hyperdense vessel or unexpected calcification. Skull: Normal. Negative for fracture or focal lesion. Sinuses/Orbits: No middle ear or mastoid effusion. Paranasal sinuses are clear. Bilateral lens replacement. Scleral band on the right. Orbits are otherwise unremarkable. Other: None CT CERVICAL SPINE FINDINGS Alignment: Trace retrolisthesis of C4 on C5 and C5 on C6. Skull base and vertebrae: No acute fracture. No  primary bone lesion or focal pathologic process. Soft tissues and spinal canal: No prevertebral fluid or swelling. No visible canal hematoma. Disc levels:  Mild spinal canal narrowing C4-C5. Upper chest: Negative. Other: None IMPRESSION: 1. No acute intracranial abnormality. Sequela of moderate chronic microvascular ischemic change. 2. No acute abnormality in the cervical spine. Electronically Signed   By: Lorenza Cambridge M.D.   On: 09/05/2022 15:24   CT Cervical Spine Wo Contrast  Result Date: 09/05/2022 CLINICAL DATA:  Neck pain, acute, no red flags; Headache, new onset (Age >= 51y) EXAM: CT HEAD WITHOUT CONTRAST CT CERVICAL SPINE WITHOUT CONTRAST TECHNIQUE: Multidetector CT imaging of the head and cervical  spine was performed following the standard protocol without intravenous contrast. Multiplanar CT image reconstructions of the cervical spine were also generated. RADIATION DOSE REDUCTION: This exam was performed according to the departmental dose-optimization program which includes automated exposure control, adjustment of the mA and/or kV according to patient size and/or use of iterative reconstruction technique. COMPARISON:  MR Head 11/08/12, CT head 11/08/12 FINDINGS: CT HEAD FINDINGS Brain: No evidence of acute infarction, hemorrhage, hydrocephalus, extra-axial collection or mass lesion/mass effect. Sequela of moderate chronic microvascular ischemic change. Vascular: No hyperdense vessel or unexpected calcification. Skull: Normal. Negative for fracture or focal lesion. Sinuses/Orbits: No middle ear or mastoid effusion. Paranasal sinuses are clear. Bilateral lens replacement. Scleral band on the right. Orbits are otherwise unremarkable. Other: None CT CERVICAL SPINE FINDINGS Alignment: Trace retrolisthesis of C4 on C5 and C5 on C6. Skull base and vertebrae: No acute fracture. No primary bone lesion or focal pathologic process. Soft tissues and spinal canal: No prevertebral fluid or swelling. No visible canal  hematoma. Disc levels:  Mild spinal canal narrowing C4-C5. Upper chest: Negative. Other: None IMPRESSION: 1. No acute intracranial abnormality. Sequela of moderate chronic microvascular ischemic change. 2. No acute abnormality in the cervical spine. Electronically Signed   By: Lorenza Cambridge M.D.   On: 09/05/2022 15:24    Procedures Procedures    Medications Ordered in ED Medications - No data to display  ED Course/ Medical Decision Making/ A&P                             Medical Decision Making Amount and/or Complexity of Data Reviewed Labs: ordered. Decision-making details documented in ED Course. Radiology: ordered. Decision-making details documented in ED Course.   Medical Decision Making:   Wesley Fowler. is a 77 y.o. male who presented to the ED today with headache detailed above.    Patient's presentation is complicated by their history of advanced age, HTN.  Complete initial physical exam performed, notably the patient was in NAD. Neurologically intact. PERRL, EOMI. Nonfocal neuro exam. No meningismus.    Reviewed and confirmed nursing documentation for past medical history, family history, social history.    Initial Assessment:   With the patient's presentation of headache, differential diagnosis includes but is not limited to emergent considerations for headache include subarachnoid hemorrhage, meningitis, temporal arteritis, glaucoma, cerebral ischemia, carotid/vertebral dissection, intracranial tumor, venous sinus thrombosis, carbon monoxide poisoning, acute or chronic subdural hemorrhage.  Other considerations include: migraine, cluster headache, hypertension, caffeine, alcohol, or drug withdrawal, pseudotumor cerebri, arteriovenous malformation, head injury, neurocysticercosis, post-lumbar puncture, preeclampsia in those assigned male at birth and in reproductive age, tension headache, viral vs acute bacterial sinusitis, cervical arthritis, refractive error causing strain,  temporomandibular joint syndrome, depression, somatoform disorder (eg, somatization), trigeminal neuralgia, glossopharyngeal neuralgia.  This is most consistent with an acute complicated illness  Initial Plan:  Screening labs including CBC and Metabolic panel to evaluate for infectious or metabolic etiology of disease.  CT brain/cervical spine to assess for intracranial pathology Symptomatic management Objective evaluation as below reviewed   Initial Study Results:   Laboratory  All laboratory results reviewed without evidence of clinically relevant pathology.   Exceptions include: Na 129, Hgb 12.2   Radiology:  All images reviewed independently. Agree with radiology report at this time.   CT Head Wo Contrast  Result Date: 09/05/2022 CLINICAL DATA:  Neck pain, acute, no red flags; Headache, new onset (Age >= 51y) EXAM: CT HEAD WITHOUT  CONTRAST CT CERVICAL SPINE WITHOUT CONTRAST TECHNIQUE: Multidetector CT imaging of the head and cervical spine was performed following the standard protocol without intravenous contrast. Multiplanar CT image reconstructions of the cervical spine were also generated. RADIATION DOSE REDUCTION: This exam was performed according to the departmental dose-optimization program which includes automated exposure control, adjustment of the mA and/or kV according to patient size and/or use of iterative reconstruction technique. COMPARISON:  MR Head 11/08/12, CT head 11/08/12 FINDINGS: CT HEAD FINDINGS Brain: No evidence of acute infarction, hemorrhage, hydrocephalus, extra-axial collection or mass lesion/mass effect. Sequela of moderate chronic microvascular ischemic change. Vascular: No hyperdense vessel or unexpected calcification. Skull: Normal. Negative for fracture or focal lesion. Sinuses/Orbits: No middle ear or mastoid effusion. Paranasal sinuses are clear. Bilateral lens replacement. Scleral band on the right. Orbits are otherwise unremarkable. Other: None CT CERVICAL SPINE  FINDINGS Alignment: Trace retrolisthesis of C4 on C5 and C5 on C6. Skull base and vertebrae: No acute fracture. No primary bone lesion or focal pathologic process. Soft tissues and spinal canal: No prevertebral fluid or swelling. No visible canal hematoma. Disc levels:  Mild spinal canal narrowing C4-C5. Upper chest: Negative. Other: None IMPRESSION: 1. No acute intracranial abnormality. Sequela of moderate chronic microvascular ischemic change. 2. No acute abnormality in the cervical spine. Electronically Signed   By: Lorenza Cambridge M.D.   On: 09/05/2022 15:24   CT Cervical Spine Wo Contrast  Result Date: 09/05/2022 CLINICAL DATA:  Neck pain, acute, no red flags; Headache, new onset (Age >= 51y) EXAM: CT HEAD WITHOUT CONTRAST CT CERVICAL SPINE WITHOUT CONTRAST TECHNIQUE: Multidetector CT imaging of the head and cervical spine was performed following the standard protocol without intravenous contrast. Multiplanar CT image reconstructions of the cervical spine were also generated. RADIATION DOSE REDUCTION: This exam was performed according to the departmental dose-optimization program which includes automated exposure control, adjustment of the mA and/or kV according to patient size and/or use of iterative reconstruction technique. COMPARISON:  MR Head 11/08/12, CT head 11/08/12 FINDINGS: CT HEAD FINDINGS Brain: No evidence of acute infarction, hemorrhage, hydrocephalus, extra-axial collection or mass lesion/mass effect. Sequela of moderate chronic microvascular ischemic change. Vascular: No hyperdense vessel or unexpected calcification. Skull: Normal. Negative for fracture or focal lesion. Sinuses/Orbits: No middle ear or mastoid effusion. Paranasal sinuses are clear. Bilateral lens replacement. Scleral band on the right. Orbits are otherwise unremarkable. Other: None CT CERVICAL SPINE FINDINGS Alignment: Trace retrolisthesis of C4 on C5 and C5 on C6. Skull base and vertebrae: No acute fracture. No primary bone lesion  or focal pathologic process. Soft tissues and spinal canal: No prevertebral fluid or swelling. No visible canal hematoma. Disc levels:  Mild spinal canal narrowing C4-C5. Upper chest: Negative. Other: None IMPRESSION: 1. No acute intracranial abnormality. Sequela of moderate chronic microvascular ischemic change. 2. No acute abnormality in the cervical spine. Electronically Signed   By: Lorenza Cambridge M.D.   On: 09/05/2022 15:24      Final Assessment and Plan:   77 year old male presents to ED for right sided headache. Currently asymptomatic. Neurologically intact on exam. No history of similar headaches. Associated right sided trapezius pain as well. Headache sounds cervicogenic vs tension in nature. Nontoxic appearing. Workup initiated as above for further evaluation. No visual changes. Low suspicion for ocular emergency. Discussed case with attending physician who co-signed this note who also evaluated pt and agreed with management. CT head/neck without acute findings. Pt remains with reassuring neuro exam. Will have follow up with PCP  for recheck and to discuss hyponatremia. Strict ED return precautions given, all questions answered, and stable for discharge.    Clinical Impression:  1. Cervicogenic headache   2. Hyponatremia      Discharge           Final Clinical Impression(s) / ED Diagnoses Final diagnoses:  Hyponatremia  Cervicogenic headache    Rx / DC Orders ED Discharge Orders     None         Richardson Dopp 09/05/22 1540    Rondel Baton, MD 09/08/22 1017

## 2022-09-05 NOTE — Discharge Instructions (Addendum)
Your imaging was normal. Follow up with your PCP this week for re-evaluation and to discuss your low sodium level today. For any new or worsening symptoms, return to nearest ED for re-evaluation.

## 2022-09-05 NOTE — ED Triage Notes (Signed)
Pt arrived POV from home, R eye pain with headache and muscles tender at R base of head/neck that started this am at 1000. Pain has subsided after taking Tylenol. No vision changes or neuro deficits. A/ox4, VSS.   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

## 2022-09-06 ENCOUNTER — Telehealth: Payer: Self-pay

## 2022-09-06 NOTE — Transitions of Care (Post Inpatient/ED Visit) (Signed)
   09/06/2022  Name: Wesley Fowler. MRN: [id number] DOB: 12-Aug-1945  Today's TOC FU Call Status: Today's TOC FU Call Status:: Unsuccessul Call (1st Attempt) Unsuccessful Call (1st Attempt) Date: 09/06/22  Attempted to reach the patient regarding the most recent Inpatient/ED visit.  Follow Up Plan: Additional outreach attempts will be made to reach the patient to complete the Transitions of Care (Post Inpatient/ED visit) call.   Signature Karena Addison, LPN [facility] Nurse Health Advisor Direct Dial [phone]

## 2022-09-07 NOTE — Transitions of Care (Post Inpatient/ED Visit) (Signed)
   09/07/2022  Name: Wesley Fowler. MRN: [id number] DOB: 1945-05-05  Today's TOC FU Call Status: Today's TOC FU Call Status:: Unsuccessful Call (2nd Attempt) Unsuccessful Call (1st Attempt) Date: 09/06/22 Unsuccessful Call (2nd Attempt) Date: 09/07/22  Attempted to reach the patient regarding the most recent Inpatient/ED visit.  Follow Up Plan: Additional outreach attempts will be made to reach the patient to complete the Transitions of Care (Post Inpatient/ED visit) call.   Signature Karena Addison, LPN [facility] Nurse Health Advisor Direct Dial [phone]

## 2022-09-08 ENCOUNTER — Encounter (INDEPENDENT_AMBULATORY_CARE_PROVIDER_SITE_OTHER): Payer: Medicare Other | Admitting: Ophthalmology

## 2022-09-09 NOTE — Transitions of Care (Post Inpatient/ED Visit) (Signed)
   09/09/2022  Name: Wesley Fowler. MRN: [id number] DOB: 1945-12-05  Today's TOC FU Call Status: Today's TOC FU Call Status:: Unsuccessful Call (3rd Attempt) Unsuccessful Call (1st Attempt) Date: 09/06/22 Unsuccessful Call (2nd Attempt) Date: 09/07/22 Unsuccessful Call (3rd Attempt) Date: 09/09/22  Attempted to reach the patient regarding the most recent Inpatient/ED visit.  Follow Up Plan: No further outreach attempts will be made at this time. We have been unable to contact the patient.  Signature Karena Addison, LPN [facility] Nurse Health Advisor Direct Dial [phone]

## 2022-09-13 ENCOUNTER — Ambulatory Visit: Payer: Medicare Other | Admitting: Family Medicine

## 2022-09-14 ENCOUNTER — Telehealth: Payer: Self-pay | Admitting: Family Medicine

## 2022-09-14 NOTE — Telephone Encounter (Signed)
Patient aware and verbalizes understanding. 

## 2022-09-14 NOTE — Telephone Encounter (Signed)
BP goal in 140/90 and below, continue to monitor and limit sodium intake. If remains elevated, make an appointment to be evaluated.

## 2022-10-01 ENCOUNTER — Encounter (HOSPITAL_BASED_OUTPATIENT_CLINIC_OR_DEPARTMENT_OTHER): Payer: Self-pay | Admitting: Emergency Medicine

## 2022-10-01 ENCOUNTER — Emergency Department (HOSPITAL_BASED_OUTPATIENT_CLINIC_OR_DEPARTMENT_OTHER): Payer: Medicare Other | Admitting: Radiology

## 2022-10-01 ENCOUNTER — Other Ambulatory Visit: Payer: Self-pay

## 2022-10-01 ENCOUNTER — Emergency Department (HOSPITAL_BASED_OUTPATIENT_CLINIC_OR_DEPARTMENT_OTHER)
Admission: EM | Admit: 2022-10-01 | Discharge: 2022-10-01 | Disposition: A | Discharge status: Home / Self Care | Payer: Medicare Other | Attending: Emergency Medicine | Admitting: Emergency Medicine

## 2022-10-01 DIAGNOSIS — Z7982 Long term (current) use of aspirin: Secondary | ICD-10-CM | POA: Diagnosis not present

## 2022-10-01 DIAGNOSIS — S62102A Fracture of unspecified carpal bone, left wrist, initial encounter for closed fracture: Secondary | ICD-10-CM

## 2022-10-01 DIAGNOSIS — S52572A Other intraarticular fracture of lower end of left radius, initial encounter for closed fracture: Secondary | ICD-10-CM | POA: Diagnosis not present

## 2022-10-01 DIAGNOSIS — W130XXA Fall from, out of or through balcony, initial encounter: Secondary | ICD-10-CM | POA: Insufficient documentation

## 2022-10-01 DIAGNOSIS — M25532 Pain in left wrist: Secondary | ICD-10-CM | POA: Diagnosis present

## 2022-10-01 DIAGNOSIS — S52612A Displaced fracture of left ulna styloid process, initial encounter for closed fracture: Secondary | ICD-10-CM | POA: Diagnosis not present

## 2022-10-01 HISTORY — DX: Essential (primary) hypertension: I10

## 2022-10-01 MED ORDER — OXYCODONE HCL 5 MG PO TABS: 5.0000 mg | ORAL_TABLET | Freq: Four times a day (QID) | ORAL | 0 refills | Status: DC | PRN

## 2022-10-01 MED ORDER — OXYCODONE HCL 5 MG PO TABS
5.0000 mg | ORAL_TABLET | Freq: Once | ORAL | Status: AC
  Administered 2022-10-01: 5 mg via ORAL
  Filled 2022-10-01: qty 1

## 2022-10-01 NOTE — Discharge Instructions (Signed)
I have given you narcotic pain medicine called oxycodone for breakthrough pain.  Otherwise use 500 mg of Tylenol every 6 hours as needed for pain.

## 2022-10-01 NOTE — ED Provider Notes (Signed)
[facility] EMERGENCY DEPARTMENT AT [facility] Provider Note   CSN: [id number] Arrival date & time: 10/01/22  1056     History  Chief Complaint  Patient presents with   Marletta Lor    Akhilesh Sparr. is a 77 y.o. male.  Patient here after fall.  Pain mostly to the left wrist.  He is on a blood thinners.  Did not hit his head or lose consciousness.  Lost his balance while going up stairs.  Pain also to the left shoulder and left hip.  But has been able to ambulate since the fall.  He is not having any neck pain or chest pain or shortness of breath or weakness or numbness or tingling.  The history is provided by the patient.       Home Medications Prior to Admission medications   Medication Sig Start Date End Date Taking? Authorizing Provider  oxyCODONE (ROXICODONE) 5 MG immediate release tablet Take 1 tablet (5 mg total) by mouth every 6 (six) hours as needed for up to 10 doses for breakthrough pain. 10/01/22  Yes Yasemin Rabon, DO  aspirin EC 325 MG tablet Take 81 mg by mouth daily. Patient takes sporadically    [provider]  buPROPion (WELLBUTRIN XL) 150 MG 24 hr tablet Take 2 tablets (300 mg total) by mouth daily. 06/07/22   Sonny Masters, FNP  busPIRone (BUSPAR) 5 MG tablet Take 1 tablet (5 mg total) by mouth 2 (two) times daily. 06/07/22   Sonny Masters, FNP  enalapril (VASOTEC) 5 MG tablet Take 1 tablet (5 mg total) by mouth daily. 08/30/22   Sonny Masters, FNP  famotidine (PEPCID) 20 MG tablet Take 1 tablet (20 mg total) by mouth 2 (two) times daily. 01/13/22   Mechele Claude, MD  latanoprost (XALATAN) 0.005 % ophthalmic solution  06/10/16   [provider]  meclizine (ANTIVERT) 25 MG tablet TAKE 1 TABLET BY MOUTH THREE TIMES DAILY AS NEEDED FOR DIZZINESS 06/07/22   Sonny Masters, FNP  Multiple Vitamin (MULTIVITAMIN WITH MINERALS) TABS tablet Take 1 tablet by mouth daily.    [provider]  Multiple Vitamins-Minerals (EYE VITAMINS PO) Take 1  tablet by mouth daily.    [provider]      Allergies    Flagyl [metronidazole], Oxycodone-acetaminophen, Percocet [oxycodone-acetaminophen], and Sulfa antibiotics    Review of Systems   Review of Systems  Physical Exam Updated Vital Signs BP (!) 162/85 (BP Location: Right Arm)   Pulse 81   Temp 97.7 F (36.5 C)   Resp 18   SpO2 99%  Physical Exam Vitals and nursing note reviewed.  Constitutional:      General: He is not in acute distress.    Appearance: He is well-developed. He is not ill-appearing.  HENT:     Head: Normocephalic and atraumatic.     Mouth/Throat:     Mouth: Mucous membranes are moist.  Eyes:     Extraocular Movements: Extraocular movements intact.     Conjunctiva/sclera: Conjunctivae normal.     Pupils: Pupils are equal, round, and reactive to light.  Cardiovascular:     Rate and Rhythm: Normal rate and regular rhythm.     Pulses: Normal pulses.     Heart sounds: Normal heart sounds. No murmur heard. Pulmonary:     Effort: Pulmonary effort is normal. No respiratory distress.     Breath sounds: Normal breath sounds.  Abdominal:     Palpations: Abdomen is soft.  Tenderness: There is no abdominal tenderness.  Musculoskeletal:        General: No swelling. Normal range of motion.     Cervical back: Normal range of motion and neck supple. No tenderness.     Comments: Tenderness to the left wrist, left hip, left shoulder but no obvious deformity, no midline spinal tenderness  Skin:    General: Skin is warm and dry.     Capillary Refill: Capillary refill takes less than 2 seconds.  Neurological:     General: No focal deficit present.     Mental Status: He is alert and oriented to person, place, and time.     Cranial Nerves: No cranial nerve deficit.     Sensory: No sensory deficit.     Motor: No weakness.     Coordination: Coordination normal.     Gait: Gait normal.  Psychiatric:        Mood and Affect: Mood normal.     ED Results /  Procedures / Treatments   Labs (all labs ordered are listed, but only abnormal results are displayed) Labs Reviewed - No data to display  EKG None  Radiology DG Wrist Complete Left  Result Date: 10/01/2022 CLINICAL DATA:  77 year old male with history of trauma from a fall off of a porch today. Left wrist pain. EXAM: LEFT WRIST - COMPLETE 3+ VIEW COMPARISON:  No priors. FINDINGS: Four views of the left wrist demonstrate a minimally displaced mildly comminuted ulnar styloid avulsion fracture. There is also a mildly comminuted intra-articular fracture of the distal radius which appears mildly impacted with minimal displacement (approximately 3 mm) of dorsal fracture fragments and extensive surrounding soft tissue swelling. Extensive chondrocalcinosis noted in the radiocarpal joint. Multifocal joint space narrowing, subchondral sclerosis, subchondral cyst formation and osteophyte formation noted in the wrist joint, indicative of osteoarthritis, most severe at the first [facility] joint. IMPRESSION: 1. Acute mildly comminuted impacted minimally displaced intra-articular fracture of the distal radius, as above. 2. Acute minimally comminuted mildly displaced ulnar styloid avulsion fracture. 3. Degenerative changes of osteoarthritis, as above. 4. 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 Electronically Signed   By: Trudie Reed M.D.   On: 10/01/2022 12:33   DG Shoulder Left  Result Date: 10/01/2022 CLINICAL DATA:  77 year old male with history of left shoulder pain after a fall. EXAM: LEFT SHOULDER - 2+ VIEW COMPARISON:  No priors. FINDINGS: Two views of the left shoulder demonstrate no acute displaced fracture or dislocation. Mild degenerative changes of osteoarthritis are noted in the left shoulder joint. In addition, there is extensive chondrocalcinosis. IMPRESSION: 1. No acute radiographic abnormality of the left shoulder. 2. 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 3. Mild glenohumeral joint osteoarthritis. Electronically Signed   By: Trudie Reed M.D.   On: 10/01/2022 12:21   DG Hip Unilat With Pelvis 2-3 Views Left  Result Date: 10/01/2022 CLINICAL DATA:  76 year old male with history of trauma from a fall. Left hip pain. EXAM: DG HIP (WITH OR WITHOUT PELVIS) 2-3V LEFT COMPARISON:  No priors. FINDINGS: AP view of the bony pelvis and AP and lateral views of the left hip demonstrate no definite acute displaced fracture of the bony pelvic ring. Bilateral proximal femurs as visualized appear intact, and the left femoral head is properly located. There is joint space narrowing, subchondral sclerosis, subchondral cyst formation and osteophyte formation in the hip joints bilaterally, indicative of osteoarthritis. Numerous atherosclerotic calcifications are noted in the pelvic  vasculature. Soft tissue anchors projecting over the left pelvis. IMPRESSION: 1. No acute radiographic abnormality of the bony pelvis or the left hip. 2. Severe bilateral hip joint osteoarthritis. Electronically Signed   By: Trudie Reed M.D.   On: 10/01/2022 12:17    Procedures Procedures    Medications Ordered in ED Medications  oxyCODONE (Oxy IR/ROXICODONE) immediate release tablet 5 mg (5 mg Oral Given 10/01/22 1248)    ED Course/ Medical Decision Making/ A&P                             Medical Decision Making Amount and/or Complexity of Data Reviewed Radiology: ordered.  Risk Prescription drug management.   Damita Dunnings. is here with mostly left wrist pain after mechanical fall.  Not on blood thinners.  Did not hit his head or lose consciousness.  Pain also to left shoulder and left hip but has been ambulatory.  X-rays do confirm left wrist fracture but overall good alignment.  Neurovascular neuromuscular intact.  No midline spinal pain.  X-ray of the left shoulder and left hip  unremarkable.  Placed in a splint will have him follow-up with hand.  Recommend Tylenol and oxycodone for pain control.  Discharged in good condition.  This chart was dictated using voice recognition software.  Despite best efforts to proofread,  errors can occur which can change the documentation meaning.         Final Clinical Impression(s) / ED Diagnoses Final diagnoses:  Closed fracture of left wrist, initial encounter    Rx / DC Orders ED Discharge Orders          Ordered    oxyCODONE (ROXICODONE) 5 MG immediate release tablet  Every 6 hours PRN        10/01/22 1254              Giulian Goldring, DO 10/01/22 1255

## 2022-10-01 NOTE — ED Notes (Signed)
Discharge instructions, follow up care, pain management and prescription reviewed and explained, pt verbalized understanding and had no further questions on d/c. Pt caox4, ambulatory, NAD with splint and shoulder sling in place on d/c.

## 2022-10-01 NOTE — ED Triage Notes (Signed)
NO THINNERS

## 2022-10-01 NOTE — ED Triage Notes (Signed)
Pt fell off his porch today landing on his left side, left wrist taking most of the impact. Left shoulder and left hip hurt as well.

## 2022-10-12 ENCOUNTER — Ambulatory Visit: Payer: Medicare Other | Admitting: Family Medicine

## 2022-10-31 ENCOUNTER — Telehealth: Payer: Self-pay | Admitting: Family Medicine

## 2022-10-31 NOTE — Telephone Encounter (Signed)
Pt. Needs to be seen for this. Thanks, WS 

## 2022-10-31 NOTE — Telephone Encounter (Signed)
Scheduled with his pcp for wednesday

## 2022-11-02 ENCOUNTER — Encounter: Payer: Self-pay | Admitting: Family Medicine

## 2022-11-02 ENCOUNTER — Ambulatory Visit (INDEPENDENT_AMBULATORY_CARE_PROVIDER_SITE_OTHER): Payer: Medicare Other | Admitting: Family Medicine

## 2022-11-02 VITALS — BP 161/87 | HR 88 | Temp 98.1°F | Ht 69.0 in | Wt 126.8 lb

## 2022-11-02 DIAGNOSIS — I10 Essential (primary) hypertension: Secondary | ICD-10-CM | POA: Diagnosis not present

## 2022-11-02 DIAGNOSIS — L282 Other prurigo: Secondary | ICD-10-CM

## 2022-11-02 DIAGNOSIS — R600 Localized edema: Secondary | ICD-10-CM | POA: Diagnosis not present

## 2022-11-02 MED ORDER — FUROSEMIDE 20 MG PO TABS
20.0000 mg | ORAL_TABLET | Freq: Every day | ORAL | 3 refills | Status: DC
Start: 2022-11-02 — End: 2022-11-18

## 2022-11-02 MED ORDER — TRIAMCINOLONE ACETONIDE 0.1 % EX CREA
1.0000 | TOPICAL_CREAM | Freq: Two times a day (BID) | CUTANEOUS | 0 refills | Status: DC
Start: 2022-11-02 — End: 2024-02-09

## 2022-11-02 NOTE — Patient Instructions (Addendum)
Take furosemide for the next three days and then once daily as needed for swelling or elevated blood pressure readings over 150/100    Goal BP:  For patients younger than 60: Goal BP < 140/90. For patients 60 and older: Goal BP < 150/90. For patients with diabetes: Goal BP < 140/90.  Take your medications faithfully as prescribed. Maintain a healthy weight. Get at least 150 minutes of aerobic exercise per week. Minimize salt intake, less than 2000 mg per day. Minimize alcohol intake.  DASH Eating Plan DASH stands for "Dietary Approaches to Stop Hypertension." The DASH eating plan is a healthy eating plan that has been shown to reduce high blood pressure (hypertension). Additional health benefits may include reducing the risk of type 2 diabetes mellitus, heart disease, and stroke. The DASH eating plan may also help with weight loss.  WHAT DO I NEED TO KNOW ABOUT THE DASH EATING PLAN? For the DASH eating plan, you will follow these general guidelines: Choose foods with a percent daily value for sodium of less than 5% (as listed on the food label). Use salt-free seasonings or herbs instead of table salt or sea salt. Check with your health care provider or pharmacist before using salt substitutes. Eat lower-sodium products, often labeled as "lower sodium" or "no salt added." Eat fresh foods. Eat more vegetables, fruits, and low-fat dairy products. Choose whole grains. Look for the word "whole" as the first word in the ingredient list. Choose fish and skinless chicken or [place] more often than red meat. Limit fish, poultry, and meat to 6 oz (170 g) each day. Limit sweets, desserts, sugars, and sugary drinks. Choose heart-healthy fats. Limit cheese to 1 oz (28 g) per day. Eat more home-cooked food and less restaurant, buffet, and fast food. Limit fried foods. Cook foods using methods other than frying. Limit canned vegetables. If you do use them, rinse them well to decrease the  sodium. When eating at a restaurant, ask that your food be prepared with less salt, or no salt if possible.  WHAT FOODS CAN I EAT? Seek help from a dietitian for individual calorie needs.  Grains Whole grain or whole wheat bread. Brown rice. Whole grain or whole wheat pasta. Quinoa, bulgur, and whole grain cereals. Low-sodium cereals. Corn or whole wheat flour tortillas. Whole grain cornbread. Whole grain crackers. Low-sodium crackers.  Vegetables Fresh or frozen vegetables (raw, steamed, roasted, or grilled). Low-sodium or reduced-sodium tomato and vegetable juices. Low-sodium or reduced-sodium tomato sauce and paste. Low-sodium or reduced-sodium canned vegetables.   Fruits All fresh, canned (in natural juice), or frozen fruits.  Meat and Other Protein Products Ground beef (85% or leaner), grass-fed beef, or beef trimmed of fat. Skinless chicken or [place]. Ground chicken or [place]. Pork trimmed of fat. All fish and seafood. Eggs. Dried beans, peas, or lentils. Unsalted nuts and seeds. Unsalted canned beans.  Dairy Low-fat dairy products, such as skim or 1% milk, 2% or reduced-fat cheeses, low-fat ricotta or cottage cheese, or plain low-fat yogurt. Low-sodium or reduced-sodium cheeses.  Fats and Oils Tub margarines without trans fats. Light or reduced-fat mayonnaise and salad dressings (reduced sodium). Avocado. Safflower, olive, or canola oils. Natural peanut or almond butter.  Other Unsalted popcorn and pretzels. The items listed above may not be a complete list of recommended foods or beverages. Contact your dietitian for more options.  WHAT FOODS ARE NOT RECOMMENDED?  Grains White bread. White pasta. White rice. Refined cornbread. Bagels and croissants. Crackers that contain trans fat.  Vegetables Creamed or fried vegetables. Vegetables in a cheese sauce. Regular canned vegetables. Regular canned tomato sauce and paste. Regular tomato and vegetable juices.  Fruits Dried  fruits. Canned fruit in light or heavy syrup. Fruit juice.  Meat and Other Protein Products Fatty cuts of meat. Ribs, chicken wings, bacon, sausage, bologna, salami, chitterlings, fatback, hot dogs, bratwurst, and packaged luncheon meats. Salted nuts and seeds. Canned beans with salt.  Dairy Whole or 2% milk, cream, half-and-half, and cream cheese. Whole-fat or sweetened yogurt. Full-fat cheeses or blue cheese. Nondairy creamers and whipped toppings. Processed cheese, cheese spreads, or cheese curds.  Condiments Onion and garlic salt, seasoned salt, table salt, and sea salt. Canned and packaged gravies. Worcestershire sauce. Tartar sauce. Barbecue sauce. Teriyaki sauce. Soy sauce, including reduced sodium. Steak sauce. Fish sauce. Oyster sauce. Cocktail sauce. Horseradish. Ketchup and mustard. Meat flavorings and tenderizers. Bouillon cubes. Hot sauce. Tabasco sauce. Marinades. Taco seasonings. Relishes.  Fats and Oils Butter, stick margarine, lard, shortening, ghee, and bacon fat. Coconut, palm kernel, or palm oils. Regular salad dressings.  Other Pickles and olives. Salted popcorn and pretzels.  The items listed above may not be a complete list of foods and beverages to avoid. Contact your dietitian for more information.  WHERE CAN I FIND MORE INFORMATION? National [employer], [employer], [employer] Institute: [employer] Document Released: 03/10/2011 Document Revised: 08/05/2013 Document Reviewed: 01/23/2013 [facility] Patient Information 2015 [place], [place]. This information is not intended to replace advice given to you by your health care provider. Make sure you discuss any questions you have with your health care provider.   I think that you would greatly benefit from seeing a nutritionist.  If you are interested, please call Dr. Gerilyn Pilgrim at [phone] to schedule an appointment.

## 2022-11-02 NOTE — Progress Notes (Signed)
Subjective:  Patient ID: Wesley Dunnings., male    DOB: [birth year], 77 y.o.   MRN: [id number]  Patient Care Team: Sonny Masters, FNP as PCP - General (Family Medicine)   Chief Complaint:  Hypertension (Ongoing but states that Saturday night it was 164/103.  Yesterday BP was 130/83.) and Rash (Bilateral feet x 3 weeks )   HPI: Wesley Fowler. is a 77 y.o. male presenting on 11/02/2022 for Hypertension (Ongoing but states that Saturday night it was 164/103.  Yesterday BP was 130/83.) and Rash (Bilateral feet x 3 weeks )   Pt presents today for elevated blood pressure readings, feet swelling, and rash to bilateral feet. He states his blood pressure has been running high over the last few days, was normal yesterday but high again today. Denies chest pain, palpitations, headaches, shortness of breath, orthopnea, PND, confusion, weakness, or syncope. He did have a procedure on his knees recently and has had some slight lower leg swelling for a few days. He was wearing compression stockings after the procedure and the swelling was controlled. States after he stopped wearing the stockings the swelling returned and he developed a red, pruritic rash to his feet. No known exposures. Has not tried anything for the rash. Denies weeping or drainage.   Relevant past medical, surgical, family, and social history reviewed and updated as indicated.  Allergies and medications reviewed and updated. Data reviewed: Chart in Epic.   Past Medical History:  Diagnosis Date   Anxiety    Arthritis    Glaucoma    History of high blood pressure    Hypertension     Past Surgical History:  Procedure Laterality Date   KNEE ARTHROSCOPY  04/04/2002   right   KNEE SURGERY Bilateral 2024   LAPAROSCOPIC INGUINAL HERNIA REPAIR  03/15/2011   bilateral   RETINAL DETACHMENT SURGERY  04/04/2002   right    Social History   Socioeconomic History   Marital status: Married    Spouse name: Not on file   Number of  children: Not on file   Years of education: Not on file   Highest education level: Not on file  Occupational History   Not on file  Tobacco Use   Smoking status: Former   Smokeless tobacco: Never   Tobacco comments:    quit 1982  Vaping Use   Vaping status: Never Used  Substance and Sexual Activity   Alcohol use: No   Drug use: No   Sexual activity: Never  Other Topics Concern   Not on file  Social History Narrative   Not on file   Social Determinants of Health   Financial Resource Strain: Not on file  Food Insecurity: Not on file  Transportation Needs: Not on file  Physical Activity: Not on file  Stress: Not on file  Social Connections: Not on file  Intimate Partner Violence: Not on file    Outpatient Encounter Medications as of 11/02/2022  Medication Sig   aspirin EC 325 MG tablet Take 81 mg by mouth daily. Patient takes sporadically   buPROPion (WELLBUTRIN XL) 150 MG 24 hr tablet Take 2 tablets (300 mg total) by mouth daily.   busPIRone (BUSPAR) 5 MG tablet Take 1 tablet (5 mg total) by mouth 2 (two) times daily.   enalapril (VASOTEC) 5 MG tablet Take 1 tablet (5 mg total) by mouth daily.   famotidine (PEPCID) 20 MG tablet Take 1 tablet (20 mg total) by mouth 2 (two)  times daily.   furosemide (LASIX) 20 MG tablet Take 1 tablet (20 mg total) by mouth daily. Take for three days and then once daily as needed for swelling, high blood pressure   latanoprost (XALATAN) 0.005 % ophthalmic solution    meclizine (ANTIVERT) 25 MG tablet TAKE 1 TABLET BY MOUTH THREE TIMES DAILY AS NEEDED FOR DIZZINESS   Multiple Vitamin (MULTIVITAMIN WITH MINERALS) TABS tablet Take 1 tablet by mouth daily.   Multiple Vitamins-Minerals (EYE VITAMINS PO) Take 1 tablet by mouth daily.   triamcinolone cream (KENALOG) 0.1 % Apply 1 Application topically 2 (two) times daily.   oxyCODONE (ROXICODONE) 5 MG immediate release tablet Take 1 tablet (5 mg total) by mouth every 6 (six) hours as needed for up to  10 doses for breakthrough pain. (Patient not taking: Reported on 11/02/2022)   No facility-administered encounter medications on file as of 11/02/2022.    Allergies  Allergen Reactions   Flagyl [Metronidazole]     GI upset   Oxycodone-Acetaminophen    Percocet [Oxycodone-Acetaminophen] Nausea And Vomiting   Sulfa Antibiotics Nausea Only    Review of Systems  Constitutional:  Negative for activity change, appetite change, chills, diaphoresis, fatigue, fever and unexpected weight change.  HENT: Negative.    Eyes: Negative.  Negative for photophobia and visual disturbance.  Respiratory:  Negative for cough, chest tightness and shortness of breath.   Cardiovascular:  Positive for leg swelling. Negative for chest pain and palpitations.  Gastrointestinal:  Negative for abdominal pain, blood in stool, constipation, diarrhea, nausea and vomiting.  Endocrine: Negative.   Genitourinary:  Negative for decreased urine volume, difficulty urinating, dysuria, frequency and urgency.  Musculoskeletal:  Negative for arthralgias and myalgias.  Skin:  Positive for color change and rash.  Allergic/Immunologic: Negative.   Neurological:  Negative for dizziness, tremors, seizures, syncope, facial asymmetry, speech difficulty, weakness, light-headedness, numbness and headaches.  Hematological: Negative.   Psychiatric/Behavioral:  Negative for confusion, hallucinations, sleep disturbance and suicidal ideas.   All other systems reviewed and are negative.       Objective:  BP (!) 161/87   Pulse 88   Temp 98.1 F (36.7 C) (Temporal)   Ht 5\' 9"  (1.753 m)   Wt 126 lb 12.8 oz (57.5 kg)   SpO2 100%   BMI 18.73 kg/m    Wt Readings from Last 3 Encounters:  11/02/22 126 lb 12.8 oz (57.5 kg)  09/05/22 130 lb (59 kg)  06/07/22 127 lb 6.4 oz (57.8 kg)    Physical Exam Vitals and nursing note reviewed.  Constitutional:      General: He is not in acute distress.    Appearance: Normal appearance. He is  well-developed and well-groomed. He is not ill-appearing, toxic-appearing or diaphoretic.  HENT:     Head: Normocephalic and atraumatic.     Jaw: There is normal jaw occlusion.     Right Ear: Hearing normal.     Left Ear: Hearing normal.     Nose: Nose normal.     Mouth/Throat:     Lips: Pink.     Mouth: Mucous membranes are moist.     Pharynx: Oropharynx is clear. Uvula midline.  Eyes:     General: Lids are normal.     Extraocular Movements: Extraocular movements intact.     Conjunctiva/sclera: Conjunctivae normal.     Pupils: Pupils are equal, round, and reactive to light.  Neck:     Thyroid: No thyroid mass, thyromegaly or thyroid tenderness.  Vascular: No carotid bruit or JVD.     Trachea: Trachea and phonation normal.  Cardiovascular:     Rate and Rhythm: Normal rate and regular rhythm.     Chest Wall: PMI is not displaced.     Pulses: Normal pulses.     Heart sounds: Normal heart sounds. No murmur heard.    No friction rub. No gallop.  Pulmonary:     Effort: Pulmonary effort is normal. No respiratory distress.     Breath sounds: Normal breath sounds. No wheezing.  Abdominal:     General: Bowel sounds are normal. There is no distension or abdominal bruit.     Palpations: Abdomen is soft. There is no hepatomegaly or splenomegaly.     Tenderness: There is no abdominal tenderness. There is no right CVA tenderness or left CVA tenderness.     Hernia: No hernia is present.  Musculoskeletal:        General: Normal range of motion.     Cervical back: Normal range of motion and neck supple.     Right lower leg: Edema present.     Left lower leg: Edema present.  Lymphadenopathy:     Cervical: No cervical adenopathy.  Skin:    General: Skin is warm and dry.     Capillary Refill: Capillary refill takes less than 2 seconds.     Coloration: Skin is not cyanotic, jaundiced or pale.     Findings: Erythema and rash (bilateral feet) present. Rash is papular. Rash is not crusting,  macular, nodular, purpuric, pustular, scaling, urticarial or vesicular.     Nails: There is no clubbing.       Neurological:     General: No focal deficit present.     Mental Status: He is alert and oriented to person, place, and time.     Sensory: Sensation is intact.     Motor: Motor function is intact.     Coordination: Coordination is intact.     Gait: Gait is intact.     Deep Tendon Reflexes: Reflexes are normal and symmetric.  Psychiatric:        Attention and Perception: Attention and perception normal.        Mood and Affect: Mood and affect normal.        Speech: Speech normal.        Behavior: Behavior normal. Behavior is cooperative.        Thought Content: Thought content normal.        Cognition and Memory: Cognition and memory normal.        Judgment: Judgment normal.     Results for orders placed or performed during the hospital encounter of 09/05/22  CBC with Differential  Result Value Ref Range   WBC 4.8 4.0 - 10.5 K/uL   RBC 3.79 (L) 4.22 - 5.81 MIL/uL   Hemoglobin 12.2 (L) 13.0 - 17.0 g/dL   HCT 25.3 (L) 66.4 - 40.3 %   MCV 88.1 80.0 - 100.0 fL   MCH 32.2 26.0 - 34.0 pg   MCHC 36.5 (H) 30.0 - 36.0 g/dL   RDW 47.4 25.9 - 56.3 %   Platelets 280 150 - 400 K/uL   nRBC 0.0 0.0 - 0.2 %   Neutrophils Relative % 68 %   Neutro Abs 3.3 1.7 - 7.7 K/uL   Lymphocytes Relative 21 %   Lymphs Abs 1.0 0.7 - 4.0 K/uL   Monocytes Relative 9 %   Monocytes Absolute 0.4 0.1 - 1.0 K/uL  Eosinophils Relative 1 %   Eosinophils Absolute 0.0 0.0 - 0.5 K/uL   Basophils Relative 1 %   Basophils Absolute 0.1 0.0 - 0.1 K/uL   Immature Granulocytes 0 %   Abs Immature Granulocytes 0.02 0.00 - 0.07 K/uL  Basic metabolic panel  Result Value Ref Range   Sodium 129 (L) 135 - 145 mmol/L   Potassium 3.9 3.5 - 5.1 mmol/L   Chloride 95 (L) 98 - 111 mmol/L   CO2 28 22 - 32 mmol/L   Glucose, Bld 96 70 - 99 mg/dL   BUN 10 8 - 23 mg/dL   Creatinine, Ser 7.25 0.61 - 1.24 mg/dL    Calcium 8.9 8.9 - 36.6 mg/dL   GFR, Estimated >44 >03 mL/min   Anion gap 6 5 - 15       Pertinent labs & imaging results that were available during my care of the patient were reviewed by me and considered in my medical decision making.  Assessment & Plan:  Wesley Fowler was seen today for hypertension and rash.  Diagnoses and all orders for this visit:  Primary hypertension Pedal edema Intermittent elevated readings with lower extremity swelling. Recent bilateral knee procedures. No indications of heart failure, DVT. Will add lasix for the next three days and then as needed for elevated BP above 150/100 and lower extremity swelling. Aware to follow up in 2 weeks for reevaluation. Sodium restriction discussed in detail. -     furosemide (LASIX) 20 MG tablet; Take 1 tablet (20 mg total) by mouth daily. Take for three days and then once daily as needed for swelling, high blood pressure  Pruritic rash Likely from compression stocking use. Will treat with below. Pt aware to report new, worsening, or persistent symptoms.  -     triamcinolone cream (KENALOG) 0.1 %; Apply 1 Application topically 2 (two) times daily.     Continue all other maintenance medications.  Follow up plan: Return in about 2 weeks (around 11/16/2022) for HTN.   Continue healthy lifestyle choices, including diet (rich in fruits, vegetables, and lean proteins, and low in salt and simple carbohydrates) and exercise (at least 30 minutes of moderate physical activity daily).  Educational handout given for DASH diet, HTN  The above assessment and management plan was discussed with the patient. The patient verbalized understanding of and has agreed to the management plan. Patient is aware to call the clinic if they develop any new symptoms or if symptoms persist or worsen. Patient is aware when to return to the clinic for a follow-up visit. Patient educated on when it is appropriate to go to the emergency department.   Kari Baars, FNP-C [facility] [phone]

## 2022-11-09 ENCOUNTER — Encounter (INDEPENDENT_AMBULATORY_CARE_PROVIDER_SITE_OTHER): Payer: Medicare Other | Admitting: Ophthalmology

## 2022-11-09 DIAGNOSIS — H353112 Nonexudative age-related macular degeneration, right eye, intermediate dry stage: Secondary | ICD-10-CM

## 2022-11-09 DIAGNOSIS — H43813 Vitreous degeneration, bilateral: Secondary | ICD-10-CM | POA: Diagnosis not present

## 2022-11-09 DIAGNOSIS — H35371 Puckering of macula, right eye: Secondary | ICD-10-CM

## 2022-11-09 DIAGNOSIS — H338 Other retinal detachments: Secondary | ICD-10-CM | POA: Diagnosis not present

## 2022-11-09 DIAGNOSIS — H353121 Nonexudative age-related macular degeneration, left eye, early dry stage: Secondary | ICD-10-CM

## 2022-11-18 ENCOUNTER — Ambulatory Visit (INDEPENDENT_AMBULATORY_CARE_PROVIDER_SITE_OTHER): Payer: Medicare Other | Admitting: Family Medicine

## 2022-11-18 ENCOUNTER — Encounter: Payer: Self-pay | Admitting: Family Medicine

## 2022-11-18 DIAGNOSIS — R600 Localized edema: Secondary | ICD-10-CM

## 2022-11-18 DIAGNOSIS — I10 Essential (primary) hypertension: Secondary | ICD-10-CM | POA: Diagnosis not present

## 2022-11-18 MED ORDER — FUROSEMIDE 20 MG PO TABS: 20.0000 mg | ORAL_TABLET | Freq: Every day | ORAL | 1 refills | Status: DC

## 2022-11-18 MED ORDER — ENALAPRIL MALEATE 5 MG PO TABS
10.0000 mg | ORAL_TABLET | Freq: Every day | ORAL | 1 refills | Status: DC
Start: 2022-11-18 — End: 2023-01-24

## 2022-11-18 NOTE — Progress Notes (Signed)
Subjective:  Patient ID: Wesley Dunnings., male    DOB: 01/16/1946, 77 y.o.   MRN: [id number]  Patient Care Team: Sonny Masters, FNP as PCP - General (Family Medicine)   Chief Complaint:  Blood Pressure Check (6 week follow up )   HPI: Wesley Demlow. is a 77 y.o. male presenting on 11/18/2022 for Blood Pressure Check (6 week follow up )    1. Primary hypertension Complaint with meds - Yes Current Medications - enalapril 5 mg, furosemide 20 mg Checking BP at home ranging 130-170/80-100 Exercising Regularly - No Watching Salt intake - Yes Pertinent ROS:  Headache - No Fatigue - No Visual Disturbances - No Chest pain - No Dyspnea - No Palpitations - No LE edema - No They report good compliance with medications and can restate their regimen by memory. No medication side effects.  BP Readings from Last 3 Encounters:  11/18/22 (!) 144/76  11/02/22 (!) 161/87  10/01/22 (!) 162/85     2. Pedal edema Resolved with furosemide.      Relevant past medical, surgical, family, and social history reviewed and updated as indicated.  Allergies and medications reviewed and updated. Data reviewed: Chart in Epic.   Past Medical History:  Diagnosis Date   Anxiety    Arthritis    Glaucoma    History of high blood pressure    Hypertension     Past Surgical History:  Procedure Laterality Date   KNEE ARTHROSCOPY  04/04/2002   right   KNEE SURGERY Bilateral 2024   LAPAROSCOPIC INGUINAL HERNIA REPAIR  03/15/2011   bilateral   RETINAL DETACHMENT SURGERY  04/04/2002   right    Social History   Socioeconomic History   Marital status: Married    Spouse name: Not on file   Number of children: Not on file   Years of education: Not on file   Highest education level: Not on file  Occupational History   Not on file  Tobacco Use   Smoking status: Former   Smokeless tobacco: Never   Tobacco comments:    quit 1982  Vaping Use   Vaping status: Never Used  Substance  and Sexual Activity   Alcohol use: No   Drug use: No   Sexual activity: Never  Other Topics Concern   Not on file  Social History Narrative   Not on file   Social Determinants of Health   Financial Resource Strain: Not on file  Food Insecurity: Not on file  Transportation Needs: Not on file  Physical Activity: Not on file  Stress: Not on file  Social Connections: Not on file  Intimate Partner Violence: Not on file    Outpatient Encounter Medications as of 11/18/2022  Medication Sig   aspirin EC 325 MG tablet Take 81 mg by mouth daily. Patient takes sporadically   buPROPion (WELLBUTRIN XL) 150 MG 24 hr tablet Take 2 tablets (300 mg total) by mouth daily.   busPIRone (BUSPAR) 5 MG tablet Take 1 tablet (5 mg total) by mouth 2 (two) times daily.   famotidine (PEPCID) 20 MG tablet Take 1 tablet (20 mg total) by mouth 2 (two) times daily.   latanoprost (XALATAN) 0.005 % ophthalmic solution    meclizine (ANTIVERT) 25 MG tablet TAKE 1 TABLET BY MOUTH THREE TIMES DAILY AS NEEDED FOR DIZZINESS   Multiple Vitamin (MULTIVITAMIN WITH MINERALS) TABS tablet Take 1 tablet by mouth daily.   Multiple Vitamins-Minerals (EYE VITAMINS PO) Take 1 tablet  by mouth daily.   oxyCODONE (ROXICODONE) 5 MG immediate release tablet Take 1 tablet (5 mg total) by mouth every 6 (six) hours as needed for up to 10 doses for breakthrough pain.   triamcinolone cream (KENALOG) 0.1 % Apply 1 Application topically 2 (two) times daily.   [DISCONTINUED] enalapril (VASOTEC) 5 MG tablet Take 1 tablet (5 mg total) by mouth daily.   [DISCONTINUED] furosemide (LASIX) 20 MG tablet Take 1 tablet (20 mg total) by mouth daily. Take for three days and then once daily as needed for swelling, high blood pressure   enalapril (VASOTEC) 5 MG tablet Take 2 tablets (10 mg total) by mouth daily.   furosemide (LASIX) 20 MG tablet Take 1 tablet (20 mg total) by mouth daily. Take for three days and then once daily as needed for swelling, high  blood pressure   No facility-administered encounter medications on file as of 11/18/2022.    Allergies  Allergen Reactions   Flagyl [Metronidazole]     GI upset   Oxycodone-Acetaminophen    Percocet [Oxycodone-Acetaminophen] Nausea And Vomiting   Sulfa Antibiotics Nausea Only    Review of Systems  Constitutional:  Negative for activity change, appetite change, chills, diaphoresis, fatigue, fever and unexpected weight change.  HENT: Negative.    Eyes: Negative.  Negative for photophobia and visual disturbance.  Respiratory:  Negative for cough, chest tightness and shortness of breath.   Cardiovascular:  Negative for chest pain, palpitations and leg swelling.  Gastrointestinal:  Negative for abdominal pain, blood in stool, constipation, diarrhea, nausea and vomiting.  Endocrine: Negative.   Genitourinary:  Negative for decreased urine volume, difficulty urinating, dysuria, frequency and urgency.  Musculoskeletal:  Negative for arthralgias and myalgias.  Skin: Negative.   Allergic/Immunologic: Negative.   Neurological:  Negative for dizziness and headaches.  Hematological: Negative.   Psychiatric/Behavioral:  Negative for confusion, hallucinations, sleep disturbance and suicidal ideas.   All other systems reviewed and are negative.       Objective:  BP (!) 144/76   Pulse 84   Temp 97.9 F (36.6 C) (Temporal)   Ht 5\' 9"  (1.753 m)   Wt 125 lb (56.7 kg)   SpO2 99%   BMI 18.46 kg/m    Wt Readings from Last 3 Encounters:  11/18/22 125 lb (56.7 kg)  11/02/22 126 lb 12.8 oz (57.5 kg)  09/05/22 130 lb (59 kg)    Physical Exam Vitals and nursing note reviewed.  Constitutional:      General: He is not in acute distress.    Appearance: Normal appearance. He is normal weight. He is not ill-appearing, toxic-appearing or diaphoretic.  HENT:     Head: Normocephalic and atraumatic.     Nose: Nose normal.     Mouth/Throat:     Mouth: Mucous membranes are moist.  Eyes:      Pupils: Pupils are equal, round, and reactive to light.  Cardiovascular:     Rate and Rhythm: Normal rate and regular rhythm.     Heart sounds: Normal heart sounds.  Pulmonary:     Effort: Pulmonary effort is normal.     Breath sounds: Normal breath sounds.  Musculoskeletal:     Right lower leg: No edema.     Left lower leg: No edema.  Skin:    General: Skin is warm and dry.     Capillary Refill: Capillary refill takes less than 2 seconds.  Neurological:     General: No focal deficit present.  Mental Status: He is alert and oriented to person, place, and time.  Psychiatric:        Mood and Affect: Mood normal.        Behavior: Behavior normal.        Thought Content: Thought content normal.        Judgment: Judgment normal.     Results for orders placed or performed during the hospital encounter of 09/05/22  CBC with Differential  Result Value Ref Range   WBC 4.8 4.0 - 10.5 K/uL   RBC 3.79 (L) 4.22 - 5.81 MIL/uL   Hemoglobin 12.2 (L) 13.0 - 17.0 g/dL   HCT 96.2 (L) 95.2 - 84.1 %   MCV 88.1 80.0 - 100.0 fL   MCH 32.2 26.0 - 34.0 pg   MCHC 36.5 (H) 30.0 - 36.0 g/dL   RDW 32.4 40.1 - 02.7 %   Platelets 280 150 - 400 K/uL   nRBC 0.0 0.0 - 0.2 %   Neutrophils Relative % 68 %   Neutro Abs 3.3 1.7 - 7.7 K/uL   Lymphocytes Relative 21 %   Lymphs Abs 1.0 0.7 - 4.0 K/uL   Monocytes Relative 9 %   Monocytes Absolute 0.4 0.1 - 1.0 K/uL   Eosinophils Relative 1 %   Eosinophils Absolute 0.0 0.0 - 0.5 K/uL   Basophils Relative 1 %   Basophils Absolute 0.1 0.0 - 0.1 K/uL   Immature Granulocytes 0 %   Abs Immature Granulocytes 0.02 0.00 - 0.07 K/uL  Basic metabolic panel  Result Value Ref Range   Sodium 129 (L) 135 - 145 mmol/L   Potassium 3.9 3.5 - 5.1 mmol/L   Chloride 95 (L) 98 - 111 mmol/L   CO2 28 22 - 32 mmol/L   Glucose, Bld 96 70 - 99 mg/dL   BUN 10 8 - 23 mg/dL   Creatinine, Ser 2.53 0.61 - 1.24 mg/dL   Calcium 8.9 8.9 - 66.4 mg/dL   GFR, Estimated >40 >34 mL/min    Anion gap 6 5 - 15       Pertinent labs & imaging results that were available during my care of the patient were reviewed by me and considered in my medical decision making.  Assessment & Plan:  Wesley Fowler was seen today for blood pressure check.  Diagnoses and all orders for this visit:  Primary hypertension BP fairly controlled. Changes were made in regimen today, increase enalapril to 10 mg daily. Goal BP is 130/80. Pt aware to report any persistent high or low readings. DASH diet and exercise encouraged. Exercise at least 150 minutes per week and increase as tolerated. Goal BMI > 25. Stress management encouraged. Avoid nicotine and tobacco product use. Avoid excessive alcohol and NSAID's. Avoid more than 2000 mg of sodium daily. Medications as prescribed. Follow up as scheduled.  -     enalapril (VASOTEC) 5 MG tablet; Take 2 tablets (10 mg total) by mouth daily. -     furosemide (LASIX) 20 MG tablet; Take 1 tablet (20 mg total) by mouth daily. Take for three days and then once daily as needed for swelling, high blood pressure  Pedal edema -     furosemide (LASIX) 20 MG tablet; Take 1 tablet (20 mg total) by mouth daily. Take for three days and then once daily as needed for swelling, high blood pressure     Continue all other maintenance medications.  Follow up plan: Return in about 3 months (around 02/18/2023) for HTN.  Continue healthy lifestyle choices, including diet (rich in fruits, vegetables, and lean proteins, and low in salt and simple carbohydrates) and exercise (at least 30 minutes of moderate physical activity daily).  Educational handout given for DASH diet, HTN  The above assessment and management plan was discussed with the patient. The patient verbalized understanding of and has agreed to the management plan. Patient is aware to call the clinic if they develop any new symptoms or if symptoms persist or worsen. Patient is aware when to return to the clinic for a  follow-up visit. Patient educated on when it is appropriate to go to the emergency department.   Kari Baars, FNP-C [facility] [phone]

## 2022-11-18 NOTE — Patient Instructions (Signed)

## 2022-11-24 ENCOUNTER — Telehealth: Payer: Self-pay | Admitting: Family Medicine

## 2022-11-24 NOTE — Telephone Encounter (Signed)
Pt says that since taking enalapril (VASOTEC) 5MG  his bp is going up instead of going down. Pt says that pharmacist told him that when he takes ibuprofen with this rx it can cause the bp rx not to work. Please call back

## 2022-11-29 NOTE — Telephone Encounter (Signed)
Patient aware and verbalizes understanding. 

## 2023-01-24 ENCOUNTER — Ambulatory Visit (INDEPENDENT_AMBULATORY_CARE_PROVIDER_SITE_OTHER): Payer: Medicare Other | Admitting: Family Medicine

## 2023-01-24 ENCOUNTER — Encounter: Payer: Self-pay | Admitting: Family Medicine

## 2023-01-24 VITALS — BP 128/73 | HR 75 | Temp 97.0°F | Ht 69.0 in | Wt 123.0 lb

## 2023-01-24 DIAGNOSIS — R3911 Hesitancy of micturition: Secondary | ICD-10-CM | POA: Diagnosis not present

## 2023-01-24 DIAGNOSIS — I10 Essential (primary) hypertension: Secondary | ICD-10-CM | POA: Diagnosis not present

## 2023-01-24 DIAGNOSIS — E871 Hypo-osmolality and hyponatremia: Secondary | ICD-10-CM | POA: Diagnosis not present

## 2023-01-24 DIAGNOSIS — E559 Vitamin D deficiency, unspecified: Secondary | ICD-10-CM | POA: Diagnosis not present

## 2023-01-24 DIAGNOSIS — J301 Allergic rhinitis due to pollen: Secondary | ICD-10-CM

## 2023-01-24 MED ORDER — LEVOCETIRIZINE DIHYDROCHLORIDE 5 MG PO TABS
5.0000 mg | ORAL_TABLET | Freq: Every evening | ORAL | 1 refills | Status: DC
Start: 2023-01-24 — End: 2024-01-09

## 2023-01-24 MED ORDER — ENALAPRIL MALEATE 10 MG PO TABS
10.0000 mg | ORAL_TABLET | Freq: Two times a day (BID) | ORAL | 1 refills | Status: DC
Start: 2023-01-24 — End: 2023-07-11

## 2023-01-24 NOTE — Progress Notes (Signed)
Subjective:  Patient ID: Wesley Dunnings., male    DOB: 1946/01/03, 77 y.o.   MRN: [id number]  Patient Care Team: Sonny Masters, FNP as PCP - General (Family Medicine)   Chief Complaint:  Nasal Congestion (Runny nose x 1 month.  Has been going on since he was placed on lasix . Also states that when he was started on his Lasix he is having trouble urinating. )   HPI: Wesley Minish. is a 77 y.o. male presenting on 01/24/2023 for Nasal Congestion (Runny nose x 1 month.  Has been going on since he was placed on lasix . Also states that when he was started on his Lasix he is having trouble urinating. )   Discussed the use of AI scribe software for clinical note transcription with the patient, who gave verbal consent to proceed.  History of Present Illness   The patient, with a history of hypertension, presents with a chief complaint of a persistent runny nose for over a month, which is a new symptom for him. In the past week, he has also developed a cough. He reports that his children have similar symptoms. He has been on antihypertensive medication, which he believes has not caused him to cough previously.  The patient also reports adverse reactions to his diuretic medication, which causes him to feel queasy and exacerbates his nasal congestion. He has noticed that his blood pressure remains unaffected when he does not take the diuretic, but it is well-controlled when he does.  In addition, the patient has been experiencing urinary hesitancy, which is a new symptom. He reports that he sometimes has to wait for up to five minutes before he can start urinating, but once he starts, he does not have any problems.  The patient also mentions occasional dizziness, which he has experienced for the last 15-20 years. He reports that he tolerates his blood pressure medication well. He has been monitoring his blood pressure at home and plans to continue doing so.        Relevant past medical,  surgical, family, and social history reviewed and updated as indicated.  Allergies and medications reviewed and updated. Data reviewed: Chart in Epic.   Past Medical History:  Diagnosis Date   Anxiety    Arthritis    Glaucoma    History of high blood pressure    Hypertension     Past Surgical History:  Procedure Laterality Date   KNEE ARTHROSCOPY  04/04/2002   right   KNEE SURGERY Bilateral 2024   LAPAROSCOPIC INGUINAL HERNIA REPAIR  03/15/2011   bilateral   RETINAL DETACHMENT SURGERY  04/04/2002   right    Social History   Socioeconomic History   Marital status: Married    Spouse name: Not on file   Number of children: Not on file   Years of education: Not on file   Highest education level: Not on file  Occupational History   Not on file  Tobacco Use   Smoking status: Former   Smokeless tobacco: Never   Tobacco comments:    quit 1982  Vaping Use   Vaping status: Never Used  Substance and Sexual Activity   Alcohol use: No   Drug use: No   Sexual activity: Never  Other Topics Concern   Not on file  Social History Narrative   Not on file   Social Determinants of Health   Financial Resource Strain: Not on file  Food Insecurity: Not on file  Transportation Needs: Not on file  Physical Activity: Not on file  Stress: Not on file  Social Connections: Not on file  Intimate Partner Violence: Not on file    Outpatient Encounter Medications as of 01/24/2023  Medication Sig   aspirin EC 325 MG tablet Take 81 mg by mouth daily. Patient takes sporadically   buPROPion (WELLBUTRIN XL) 150 MG 24 hr tablet Take 2 tablets (300 mg total) by mouth daily.   busPIRone (BUSPAR) 5 MG tablet Take 1 tablet (5 mg total) by mouth 2 (two) times daily.   enalapril (VASOTEC) 10 MG tablet Take 1 tablet (10 mg total) by mouth 2 (two) times daily.   famotidine (PEPCID) 20 MG tablet Take 1 tablet (20 mg total) by mouth 2 (two) times daily.   latanoprost (XALATAN) 0.005 % ophthalmic  solution    levocetirizine (XYZAL) 5 MG tablet Take 1 tablet (5 mg total) by mouth every evening.   meclizine (ANTIVERT) 25 MG tablet TAKE 1 TABLET BY MOUTH THREE TIMES DAILY AS NEEDED FOR DIZZINESS   Multiple Vitamin (MULTIVITAMIN WITH MINERALS) TABS tablet Take 1 tablet by mouth daily.   Multiple Vitamins-Minerals (EYE VITAMINS PO) Take 1 tablet by mouth daily.   triamcinolone cream (KENALOG) 0.1 % Apply 1 Application topically 2 (two) times daily.   [DISCONTINUED] enalapril (VASOTEC) 5 MG tablet Take 2 tablets (10 mg total) by mouth daily.   [DISCONTINUED] furosemide (LASIX) 20 MG tablet Take 1 tablet (20 mg total) by mouth daily. Take for three days and then once daily as needed for swelling, high blood pressure   [DISCONTINUED] oxyCODONE (ROXICODONE) 5 MG immediate release tablet Take 1 tablet (5 mg total) by mouth every 6 (six) hours as needed for up to 10 doses for breakthrough pain.   No facility-administered encounter medications on file as of 01/24/2023.    Allergies  Allergen Reactions   Flagyl [Metronidazole]     GI upset   Oxycodone-Acetaminophen    Percocet [Oxycodone-Acetaminophen] Nausea And Vomiting   Sulfa Antibiotics Nausea Only    Pertinent ROS per HPI, otherwise unremarkable      Objective:  BP 128/73   Pulse 75   Temp (!) 97 F (36.1 C) (Temporal)   Ht 5\' 9"  (1.753 m)   Wt 123 lb (55.8 kg)   SpO2 97%   BMI 18.16 kg/m    Wt Readings from Last 3 Encounters:  01/24/23 123 lb (55.8 kg)  11/18/22 125 lb (56.7 kg)  11/02/22 126 lb 12.8 oz (57.5 kg)    Physical Exam Vitals and nursing note reviewed.  Constitutional:      General: He is not in acute distress.    Appearance: Normal appearance. He is normal weight. He is not ill-appearing, toxic-appearing or diaphoretic.  HENT:     Head: Normocephalic and atraumatic.     Right Ear: Tympanic membrane, ear canal and external ear normal.     Left Ear: Tympanic membrane, ear canal and external ear normal.      Nose: Rhinorrhea present. Rhinorrhea is clear.     Right Turbinates: Not enlarged, swollen or pale.     Left Turbinates: Not enlarged, swollen or pale.     Right Sinus: No maxillary sinus tenderness or frontal sinus tenderness.     Left Sinus: No maxillary sinus tenderness or frontal sinus tenderness.     Mouth/Throat:     Lips: Pink.     Mouth: Mucous membranes are moist.     Pharynx: Postnasal drip present. No  pharyngeal swelling, oropharyngeal exudate, posterior oropharyngeal erythema or uvula swelling.  Eyes:     Conjunctiva/sclera: Conjunctivae normal.     Pupils: Pupils are equal, round, and reactive to light.  Cardiovascular:     Rate and Rhythm: Normal rate and regular rhythm.     Heart sounds: Normal heart sounds.  Pulmonary:     Effort: Pulmonary effort is normal.     Breath sounds: Normal breath sounds.  Musculoskeletal:     Cervical back: Normal range of motion and neck supple.     Right lower leg: No edema.     Left lower leg: No edema.  Lymphadenopathy:     Cervical: No cervical adenopathy.  Skin:    General: Skin is warm and dry.     Capillary Refill: Capillary refill takes less than 2 seconds.  Neurological:     General: No focal deficit present.     Mental Status: He is alert and oriented to person, place, and time.  Psychiatric:        Attention and Perception: Attention and perception normal.        Mood and Affect: Mood is anxious.        Behavior: Behavior normal.        Thought Content: Thought content normal.        Judgment: Judgment normal.    Physical Exam   HEENT: Post nasal drainage.        Results for orders placed or performed during the hospital encounter of 09/05/22  CBC with Differential  Result Value Ref Range   WBC 4.8 4.0 - 10.5 K/uL   RBC 3.79 (L) 4.22 - 5.81 MIL/uL   Hemoglobin 12.2 (L) 13.0 - 17.0 g/dL   HCT 82.9 (L) 56.2 - 13.0 %   MCV 88.1 80.0 - 100.0 fL   MCH 32.2 26.0 - 34.0 pg   MCHC 36.5 (H) 30.0 - 36.0 g/dL   RDW  86.5 78.4 - 69.6 %   Platelets 280 150 - 400 K/uL   nRBC 0.0 0.0 - 0.2 %   Neutrophils Relative % 68 %   Neutro Abs 3.3 1.7 - 7.7 K/uL   Lymphocytes Relative 21 %   Lymphs Abs 1.0 0.7 - 4.0 K/uL   Monocytes Relative 9 %   Monocytes Absolute 0.4 0.1 - 1.0 K/uL   Eosinophils Relative 1 %   Eosinophils Absolute 0.0 0.0 - 0.5 K/uL   Basophils Relative 1 %   Basophils Absolute 0.1 0.0 - 0.1 K/uL   Immature Granulocytes 0 %   Abs Immature Granulocytes 0.02 0.00 - 0.07 K/uL  Basic metabolic panel  Result Value Ref Range   Sodium 129 (L) 135 - 145 mmol/L   Potassium 3.9 3.5 - 5.1 mmol/L   Chloride 95 (L) 98 - 111 mmol/L   CO2 28 22 - 32 mmol/L   Glucose, Bld 96 70 - 99 mg/dL   BUN 10 8 - 23 mg/dL   Creatinine, Ser 2.95 0.61 - 1.24 mg/dL   Calcium 8.9 8.9 - 28.4 mg/dL   GFR, Estimated >13 >24 mL/min   Anion gap 6 5 - 15       Pertinent labs & imaging results that were available during my care of the patient were reviewed by me and considered in my medical decision making.  Assessment & Plan:  Wesley Fowler was seen today for nasal congestion.  Diagnoses and all orders for this visit:  Primary hypertension -     enalapril (VASOTEC) 10  MG tablet; Take 1 tablet (10 mg total) by mouth 2 (two) times daily. -     CMP14+EGFR -     CBC with Differential/Platelet -     Lipid panel -     Thyroid Panel With TSH  Urinary hesitancy -     CMP14+EGFR -     PSA, total and free  Vitamin D deficiency -     CMP14+EGFR -     VITAMIN D 25 Hydroxy (Vit-D Deficiency, Fractures)  Hyponatremia -     CMP14+EGFR  Seasonal allergic rhinitis due to pollen -     levocetirizine (XYZAL) 5 MG tablet; Take 1 tablet (5 mg total) by mouth every evening.     Assessment and Plan    Hypertension Well controlled on Enalapril 5mg  BID. Adverse effects from Lasix (nausea, stuffiness). Discussed increasing Enalapril to 10mg  BID and discontinuing Lasix. -Increase Enalapril to 10mg  BID and discontinue  Lasix. -Monitor blood pressure at home 3-4 times a week. -Return to clinic if blood pressure consistently above 150/90.  Allergic Rhinitis Complaints of runny nose for over a month. Postnasal drainage noted on examination. -Prescribe Xyzal 5mg  nightly.  Urinary Hesitancy New onset, possibly related to prostate enlargement. -Order Prostate Specific Antigen (PSA) test. -Check Sodium levels (previously low).  Follow-up in 6 months or sooner if blood pressure is consistently above 150/90.          Continue all other maintenance medications.  Follow up plan: Return in about 6 months (around 07/25/2023), or if symptoms worsen or fail to improve, for CPE.   Continue healthy lifestyle choices, including diet (rich in fruits, vegetables, and lean proteins, and low in salt and simple carbohydrates) and exercise (at least 30 minutes of moderate physical activity daily).  Educational handout given for HTN  The above assessment and management plan was discussed with the patient. The patient verbalized understanding of and has agreed to the management plan. Patient is aware to call the clinic if they develop any new symptoms or if symptoms persist or worsen. Patient is aware when to return to the clinic for a follow-up visit. Patient educated on when it is appropriate to go to the emergency department.   Kari Baars, FNP-C [facility] [phone]

## 2023-01-25 LAB — CMP14+EGFR
ALT: 19 IU/L (ref 0–44)
AST: 23 [IU]/L (ref 0–40)
Albumin: 4.2 g/dL (ref 3.8–4.8)
Alkaline Phosphatase: 151 IU/L — ABNORMAL HIGH (ref 44–121)
BUN/Creatinine Ratio: 14 (ref 10–24)
BUN: 11 mg/dL (ref 8–27)
Bilirubin Total: 0.4 mg/dL (ref 0.0–1.2)
CO2: 23 mmol/L (ref 20–29)
Calcium: 9.3 mg/dL (ref 8.6–10.2)
Chloride: 94 mmol/L — ABNORMAL LOW (ref 96–106)
Creatinine, Ser: 0.81 mg/dL (ref 0.76–1.27)
Globulin, Total: 2.5 g/dL (ref 1.5–4.5)
Glucose: 98 mg/dL (ref 70–99)
Potassium: 4.8 mmol/L (ref 3.5–5.2)
Sodium: 132 mmol/L — ABNORMAL LOW (ref 134–144)
Total Protein: 6.7 g/dL (ref 6.0–8.5)
eGFR: 92 mL/min/{1.73_m2} (ref >59)

## 2023-01-25 LAB — CBC WITH DIFFERENTIAL/PLATELET
Basophils Absolute: 0.1 10*3/uL (ref 0.0–0.2)
Basos: 1 %
EOS (ABSOLUTE): 0.1 10*3/uL (ref 0.0–0.4)
Eos: 2 %
Hematocrit: 40.3 % (ref 37.5–51.0)
Hemoglobin: 13.4 g/dL (ref 13.0–17.7)
Immature Grans (Abs): 0 10*3/uL (ref 0.0–0.1)
Immature Granulocytes: 0 %
Lymphocytes Absolute: 1.4 10*3/uL (ref 0.7–3.1)
Lymphs: 19 %
MCH: 30.7 pg (ref 26.6–33.0)
MCHC: 33.3 g/dL (ref 31.5–35.7)
MCV: 92 fL (ref 79–97)
Monocytes Absolute: 0.7 10*3/uL (ref 0.1–0.9)
Monocytes: 10 %
Neutrophils Absolute: 4.8 10*3/uL (ref 1.4–7.0)
Neutrophils: 68 %
Platelets: 450 10*3/uL (ref 150–450)
RBC: 4.36 x10E6/uL (ref 4.14–5.80)
RDW: 11.9 % (ref 11.6–15.4)
WBC: 7.2 10*3/uL (ref 3.4–10.8)

## 2023-01-25 LAB — PSA, TOTAL AND FREE
PSA, Free Pct: 25.5 %
PSA, Free: 0.28 ng/mL
Prostate Specific Ag, Serum: 1.1 ng/mL (ref 0.0–4.0)

## 2023-01-25 LAB — LIPID PANEL
Chol/HDL Ratio: 2.2 ratio (ref 0.0–5.0)
Cholesterol, Total: 185 mg/dL (ref 100–199)
HDL: 86 mg/dL (ref >39)
LDL Chol Calc (NIH): 86 mg/dL (ref 0–99)
Triglycerides: 68 mg/dL (ref 0–149)
VLDL Cholesterol Cal: 13 mg/dL (ref 5–40)

## 2023-01-25 LAB — THYROID PANEL WITH TSH
Free Thyroxine Index: 1.5 (ref 1.2–4.9)
T3 Uptake Ratio: 21 % — ABNORMAL LOW (ref 24–39)
T4, Total: 7.3 ug/dL (ref 4.5–12.0)
TSH: 1.3 u[IU]/mL (ref 0.450–4.500)

## 2023-01-25 LAB — VITAMIN D 25 HYDROXY (VIT D DEFICIENCY, FRACTURES): Vit D, 25-Hydroxy: 107 ng/mL — ABNORMAL HIGH (ref 30.0–100.0)

## 2023-02-24 ENCOUNTER — Ambulatory Visit: Payer: Medicare Other | Admitting: Family Medicine

## 2023-03-25 ENCOUNTER — Other Ambulatory Visit: Payer: Self-pay | Admitting: Family Medicine

## 2023-03-25 DIAGNOSIS — R42 Dizziness and giddiness: Secondary | ICD-10-CM

## 2023-03-25 DIAGNOSIS — K219 Gastro-esophageal reflux disease without esophagitis: Secondary | ICD-10-CM

## 2023-03-31 ENCOUNTER — Other Ambulatory Visit: Payer: Self-pay | Admitting: Family Medicine

## 2023-03-31 DIAGNOSIS — F419 Anxiety disorder, unspecified: Secondary | ICD-10-CM

## 2023-06-30 ENCOUNTER — Other Ambulatory Visit: Payer: Self-pay | Admitting: Family Medicine

## 2023-06-30 DIAGNOSIS — R42 Dizziness and giddiness: Secondary | ICD-10-CM

## 2023-07-11 ENCOUNTER — Other Ambulatory Visit: Payer: Self-pay | Admitting: Family Medicine

## 2023-07-11 DIAGNOSIS — I10 Essential (primary) hypertension: Secondary | ICD-10-CM

## 2023-07-28 ENCOUNTER — Ambulatory Visit (INDEPENDENT_AMBULATORY_CARE_PROVIDER_SITE_OTHER): Payer: Medicare Other | Admitting: Family Medicine

## 2023-07-28 ENCOUNTER — Encounter: Payer: Self-pay | Admitting: Family Medicine

## 2023-07-28 VITALS — BP 128/69 | HR 71 | Temp 97.5°F | Ht 69.0 in | Wt 123.2 lb

## 2023-07-28 DIAGNOSIS — F330 Major depressive disorder, recurrent, mild: Secondary | ICD-10-CM

## 2023-07-28 DIAGNOSIS — Z0001 Encounter for general adult medical examination with abnormal findings: Secondary | ICD-10-CM

## 2023-07-28 DIAGNOSIS — F419 Anxiety disorder, unspecified: Secondary | ICD-10-CM | POA: Diagnosis not present

## 2023-07-28 DIAGNOSIS — I10 Essential (primary) hypertension: Secondary | ICD-10-CM

## 2023-07-28 DIAGNOSIS — R351 Nocturia: Secondary | ICD-10-CM

## 2023-07-28 DIAGNOSIS — Z0000 Encounter for general adult medical examination without abnormal findings: Secondary | ICD-10-CM

## 2023-07-28 DIAGNOSIS — Z1159 Encounter for screening for other viral diseases: Secondary | ICD-10-CM

## 2023-07-28 DIAGNOSIS — M2170 Unequal limb length (acquired), unspecified site: Secondary | ICD-10-CM

## 2023-07-28 DIAGNOSIS — K219 Gastro-esophageal reflux disease without esophagitis: Secondary | ICD-10-CM

## 2023-07-28 DIAGNOSIS — R42 Dizziness and giddiness: Secondary | ICD-10-CM

## 2023-07-28 DIAGNOSIS — E559 Vitamin D deficiency, unspecified: Secondary | ICD-10-CM

## 2023-07-28 DIAGNOSIS — Z789 Other specified health status: Secondary | ICD-10-CM | POA: Insufficient documentation

## 2023-07-28 DIAGNOSIS — Z125 Encounter for screening for malignant neoplasm of prostate: Secondary | ICD-10-CM

## 2023-07-28 MED ORDER — BUPROPION HCL ER (XL) 150 MG PO TB24
300.0000 mg | ORAL_TABLET | Freq: Every day | ORAL | 1 refills | Status: DC
Start: 2023-07-28 — End: 2024-02-08

## 2023-07-28 MED ORDER — FAMOTIDINE 20 MG PO TABS
20.0000 mg | ORAL_TABLET | Freq: Two times a day (BID) | ORAL | 1 refills | Status: DC
Start: 2023-07-28 — End: 2024-02-08

## 2023-07-28 MED ORDER — MECLIZINE HCL 25 MG PO TABS
ORAL_TABLET | ORAL | 2 refills | Status: AC
Start: 2023-07-28 — End: ?

## 2023-07-28 MED ORDER — BUSPIRONE HCL 5 MG PO TABS
5.0000 mg | ORAL_TABLET | Freq: Two times a day (BID) | ORAL | 1 refills | Status: DC
Start: 2023-07-28 — End: 2024-02-08

## 2023-07-28 NOTE — Progress Notes (Addendum)
 Complete physical exam  Patient: Wesley Fowler.   DOB: Mar 31, 1946   78 y.o. Male  MRN: [id number]  Subjective:    Chief Complaint  Patient presents with   Annual Exam    Da Authement. is a 78 y.o. male who presents today for a complete physical exam. He reports consuming a general diet. The patient does not participate in regular exercise at present. He generally feels well. He reports sleeping well. He does not have additional problems to discuss today.    He has experienced fluctuations in blood pressure, with recent readings of 145/85 mmHg and 118/76 mmHg. He is currently taking enalapril  10 mg twice daily, which he finds more effective than once daily dosing. No shortness of breath or swelling in his legs since stopping amlodipine .  He experiences dizziness, particularly in the mornings, but not vertigo. He takes meclizine  almost every day to manage this symptom and wonders if the dizziness is related to taking his blood pressure medication and Wellbutrin  at the same time.  He has a history of knee issues, specifically Baker's cysts, which were treated with an injection that he describes as 'kind of like glue'. This treatment has been effective.  He had a squamous cell carcinoma removed, but the outer edges were not clear, and he is scheduled for further excision in June.  No significant changes in weight, hearing, or bowel habits. He takes Xyzal  for allergies, a multivitamin daily, and vitamin D  every other day. He reports sleeping well and staying active at home, although he is not exercising as much as he used to.      Most recent fall risk assessment:    06/07/2022    8:12 AM  Fall Risk   Falls in the past year? 0     Most recent depression screenings:    07/28/2023    8:20 AM 06/07/2022    8:12 AM  PHQ 2/9 Scores  PHQ - 2 Score 0   PHQ- 9 Score 0   Exception Documentation  Patient refusal    Vision:Within last year and Dental: No current dental problems and No  regular dental care   Patient Active Problem List   Diagnosis Date Noted   Statin intolerance 07/28/2023   Vitamin D  deficiency 01/24/2023   Synovial cyst of right popliteal space 07/23/2018   Degeneration of lumbar intervertebral disc 03/16/2018   Osteoarthritis of left hip 03/07/2018   Osteoarthritis of right knee 01/10/2018   Osteoarthritis of left knee 01/10/2018   Chronic hyponatremia 09/26/2017   Non-rheumatic mitral regurgitation 07/06/2016   Vertigo 11/08/2012   VBI (vertebrobasilar insufficiency) 11/08/2012   HTN (hypertension), currently controlled without medication 11/08/2012   Anxiety 11/08/2012   Past Medical History:  Diagnosis Date   Anxiety    Arthritis    Glaucoma    History of high blood pressure    Hypertension    Past Surgical History:  Procedure Laterality Date   KNEE ARTHROSCOPY  04/04/2002   right   KNEE SURGERY Bilateral 2024   LAPAROSCOPIC INGUINAL HERNIA REPAIR  03/15/2011   bilateral   RETINAL DETACHMENT SURGERY  04/04/2002   right   Social History   Tobacco Use   Smoking status: Former   Smokeless tobacco: Never   Tobacco comments:    quit 1982  Vaping Use   Vaping status: Never Used  Substance Use Topics   Alcohol use: No   Drug use: No   Social History   Socioeconomic History  Marital status: Married    Spouse name: Not on file   Number of children: Not on file   Years of education: Not on file   Highest education level: Not on file  Occupational History   Not on file  Tobacco Use   Smoking status: Former   Smokeless tobacco: Never   Tobacco comments:    quit 1982  Vaping Use   Vaping status: Never Used  Substance and Sexual Activity   Alcohol use: No   Drug use: No   Sexual activity: Never  Other Topics Concern   Not on file  Social History Narrative   Not on file   Social Drivers of Health   Financial Resource Strain: Not on file  Food Insecurity: Not on file  Transportation Needs: Not on file  Physical  Activity: Not on file  Stress: Not on file  Social Connections: Not on file  Intimate Partner Violence: Not on file   Family Status  Relation Name Status   Mother  Alive   Father  Deceased   MGM  Deceased   MGF  Deceased   PGM  Deceased   PGF  Deceased  No partnership data on file   Family History  Problem Relation Age of Onset   Lung cancer Maternal Grandfather    Allergies  Allergen Reactions   Metronidazole  Other (See Comments)    GI upset  metronidazole    Oxycodone -Acetaminophen     Percocet [Oxycodone -Acetaminophen ] Nausea And Vomiting   Statins    Sulfa Antibiotics Nausea Only and Other (See Comments)      Patient Care Team: Severa Rock HERO, FNP as PCP - General (Family Medicine)   Outpatient Medications Prior to Visit  Medication Sig   aspirin  EC 325 MG tablet Take 81 mg by mouth daily. Patient takes sporadically   latanoprost (XALATAN) 0.005 % ophthalmic solution    levocetirizine (XYZAL ) 5 MG tablet Take 1 tablet (5 mg total) by mouth every evening.   Multiple Vitamin (MULTIVITAMIN WITH MINERALS) TABS tablet Take 1 tablet by mouth daily.   Multiple Vitamins-Minerals (EYE VITAMINS PO) Take 1 tablet by mouth daily.   triamcinolone  cream (KENALOG ) 0.1 % Apply 1 Application topically 2 (two) times daily.   [DISCONTINUED] buPROPion  (WELLBUTRIN  XL) 150 MG 24 hr tablet Take 2 tablets by mouth once daily   [DISCONTINUED] busPIRone  (BUSPAR ) 5 MG tablet Take 1 tablet by mouth twice daily   [DISCONTINUED] enalapril  (VASOTEC ) 10 MG tablet Take 1 tablet by mouth twice daily   [DISCONTINUED] famotidine  (PEPCID ) 20 MG tablet Take 1 tablet by mouth twice daily   [DISCONTINUED] meclizine  (ANTIVERT ) 25 MG tablet TAKE 1 TABLET BY MOUTH THREE TIMES DAILY AS NEEDED FOR DIZZINESS   No facility-administered medications prior to visit.    ROS per HPI     Objective:     BP 128/69   Pulse 71   Temp (!) 97.5 F (36.4 C)   Ht 5' 9 (1.753 m)   Wt 123 lb 3.2 oz (55.9 kg)    SpO2 99%   BMI 18.19 kg/m  BP Readings from Last 3 Encounters:  07/28/23 128/69  01/24/23 128/73  11/18/22 (!) 144/76   Wt Readings from Last 3 Encounters:  07/28/23 123 lb 3.2 oz (55.9 kg)  01/24/23 123 lb (55.8 kg)  11/18/22 125 lb (56.7 kg)   SpO2 Readings from Last 3 Encounters:  07/28/23 99%  01/24/23 97%  11/18/22 99%      Physical Exam Vitals and nursing note  reviewed.  Constitutional:      General: He is not in acute distress.    Appearance: Normal appearance. He is normal weight. He is not ill-appearing, toxic-appearing or diaphoretic.  HENT:     Head: Normocephalic and atraumatic.     Right Ear: Tympanic membrane, ear canal and external ear normal.     Left Ear: Tympanic membrane, ear canal and external ear normal.     Nose: Nose normal.     Mouth/Throat:     Mouth: Mucous membranes are moist.     Pharynx: Oropharynx is clear.  Eyes:     Conjunctiva/sclera: Conjunctivae normal.     Pupils: Pupils are equal, round, and reactive to light.  Neck:     Vascular: No carotid bruit.  Cardiovascular:     Rate and Rhythm: Normal rate and regular rhythm.     Heart sounds: Murmur heard.     Systolic murmur is present with a grade of 1/6.  Pulmonary:     Effort: Pulmonary effort is normal.     Breath sounds: Normal breath sounds.  Abdominal:     General: Bowel sounds are normal.     Palpations: Abdomen is soft.  Musculoskeletal:        General: Normal range of motion.     Cervical back: Neck supple.     Right lower leg: No edema.     Left lower leg: No edema.     Comments: RLE shorter than left  Skin:    General: Skin is warm and dry.     Capillary Refill: Capillary refill takes less than 2 seconds.  Neurological:     General: No focal deficit present.     Mental Status: He is alert and oriented to person, place, and time.  Psychiatric:        Mood and Affect: Mood normal.        Behavior: Behavior normal.        Thought Content: Thought content normal.         Judgment: Judgment normal.       Last CBC Lab Results  Component Value Date   WBC 6.6 07/28/2023   HGB 12.9 (L) 07/28/2023   HCT 37.4 (L) 07/28/2023   MCV 92 07/28/2023   MCH 31.8 07/28/2023   RDW 11.5 (L) 07/28/2023   PLT 346 07/28/2023   Last metabolic panel Lab Results  Component Value Date   GLUCOSE 92 07/28/2023   NA 125 (L) 07/28/2023   K 4.8 07/28/2023   CL 89 (L) 07/28/2023   CO2 19 (L) 07/28/2023   BUN 9 07/28/2023   CREATININE 0.81 07/28/2023   EGFR 92 07/28/2023   CALCIUM 9.5 07/28/2023   PROT 6.6 07/28/2023   ALBUMIN 4.4 07/28/2023   LABGLOB 2.2 07/28/2023   AGRATIO 3.5 (H) 07/21/2021   BILITOT 0.5 07/28/2023   ALKPHOS 143 (H) 07/28/2023   ALKPHOS 140 (H) 07/28/2023   AST 25 07/28/2023   ALT 23 07/28/2023   ANIONGAP 6 09/05/2022   Last lipids Lab Results  Component Value Date   CHOL 182 07/28/2023   HDL 79 07/28/2023   LDLCALC 91 07/28/2023   TRIG 66 07/28/2023   CHOLHDL 2.3 07/28/2023   Last hemoglobin A1c Lab Results  Component Value Date   HGBA1C 4.5 11/13/2012   Last thyroid  functions Lab Results  Component Value Date   TSH 1.750 07/28/2023   T4TOTAL 7.9 07/28/2023   Last vitamin D  Lab Results  Component Value Date  VD25OH 77.7 07/28/2023   Last vitamin B12 and Folate Lab Results  Component Value Date   VITAMINB12 >1999 (H) 12/25/2013   FOLATE 16.9 12/25/2013        Assessment & Plan:    Routine Health Maintenance and Physical Exam  Immunization History  Administered Date(s) Administered   PFIZER(Purple Top)SARS-COV-2 Vaccination 05/09/2019, 06/03/2019   Pneumococcal Conjugate-13 07/07/2017   Pneumococcal Polysaccharide-23 06/25/2013   Td 05/09/2017   Tdap 11/25/2010    Health Maintenance  Topic Date Due   Medicare Annual Wellness (AWV)  Never done   COVID-19 Vaccine (3 - Pfizer risk series) 07/01/2019   Zoster Vaccines- Shingrix (1 of 2) 10/27/2023 (Originally 01/21/1965)   INFLUENZA VACCINE  11/03/2023    Colonoscopy  08/25/2024   DTaP/Tdap/Td (3 - Td or Tdap) 05/10/2027   Pneumococcal Vaccine: 50+ Years  Completed   Hepatitis C Screening  Completed   Hepatitis B Vaccines  Aged Out   HPV VACCINES  Aged Out   Meningococcal B Vaccine  Aged Out    Discussed health benefits of physical activity, and encouraged him to engage in regular exercise appropriate for his age and condition.  Problem List Items Addressed This Visit       Cardiovascular and Mediastinum   HTN (hypertension), currently controlled without medication   Relevant Orders   CBC with Differential/Platelet (Completed)   CMP14+EGFR (Completed)   Lipid panel (Completed)   Thyroid  Panel With TSH (Completed)     Other   Vertigo   Relevant Medications   meclizine  (ANTIVERT ) 25 MG tablet   Anxiety   Relevant Medications   busPIRone  (BUSPAR ) 5 MG tablet   buPROPion  (WELLBUTRIN  XL) 150 MG 24 hr tablet   Other Relevant Orders   CBC with Differential/Platelet (Completed)   Thyroid  Panel With TSH (Completed)   Vitamin D  deficiency   Relevant Orders   Vitamin D , 25-hydroxy (Completed)   Statin intolerance   Other Visit Diagnoses       Annual physical exam    -  Primary   Relevant Orders   CBC with Differential/Platelet (Completed)   CMP14+EGFR (Completed)   Lipid panel (Completed)   Hepatitis C Antibody (Completed)     Need for hepatitis C screening test       Relevant Orders   Hepatitis C Antibody (Completed)     Screening for prostate cancer       Relevant Orders   PSA, total and free (Completed)     Recurrent major depressive episodes, mild (HCC)       Relevant Medications   busPIRone  (BUSPAR ) 5 MG tablet   buPROPion  (WELLBUTRIN  XL) 150 MG 24 hr tablet   Other Relevant Orders   Thyroid  Panel With TSH (Completed)     Gastroesophageal reflux disease without esophagitis       Relevant Medications   famotidine  (PEPCID ) 20 MG tablet   meclizine  (ANTIVERT ) 25 MG tablet     Nocturia       Relevant Orders    PSA, total and free (Completed)     Lower limb length difference         Assessment and Plan    Wellness Visit Routine wellness visit with no significant changes in weight, hearing, or bowel habits. Active at home but reduced exercise. No edema since discontinuing amlodipine . No dyspnea. Good sleep and diet. No significant bone pain or gait issues. - Update lab work today - Follow-up in six months unless issues arise  Hypertension Blood pressure readings fluctuate  but are generally within acceptable range. Current enalapril  regimen is effective with no significant side effects. Dizziness possibly related to medication timing. - Continue enalapril  10 mg twice daily - Monitor blood pressure regularly  Dizziness Intermittent morning dizziness, not vertigo. Possibly related to timing of antihypertensive and Wellbutrin . Experienced for years. - Continue current medication regimen - Monitor symptoms and adjust medication timing if necessary  Mitral valve regurgitation Mild mitral valve regurgitation. Last echocardiogram date unclear. No cardiologist currently involved. - Review previous echocardiogram records - Order echocardiogram if needed  Squamous cell carcinoma Recent incomplete excision of squamous cell carcinoma; additional excision planned for June. - Proceed with planned excision in June  General Health Maintenance All vaccines up to date except for shingles. He declined shingles vaccine. - Consider shingles vaccine in future if desired       Return in about 6 months (around 01/27/2024) for chronic follow up.     Rosaline Bruns, FNP

## 2023-07-29 LAB — PSA, TOTAL AND FREE
PSA, Free Pct: 25 %
PSA, Free: 0.2 ng/mL
Prostate Specific Ag, Serum: 0.8 ng/mL (ref 0.0–4.0)

## 2023-07-29 LAB — CBC WITH DIFFERENTIAL/PLATELET
Basophils Absolute: 0.1 10*3/uL (ref 0.0–0.2)
Basos: 1 %
EOS (ABSOLUTE): 0.1 10*3/uL (ref 0.0–0.4)
Eos: 2 %
Hematocrit: 37.4 % — ABNORMAL LOW (ref 37.5–51.0)
Hemoglobin: 12.9 g/dL — ABNORMAL LOW (ref 13.0–17.7)
Immature Grans (Abs): 0 10*3/uL (ref 0.0–0.1)
Immature Granulocytes: 0 %
Lymphocytes Absolute: 1.4 10*3/uL (ref 0.7–3.1)
Lymphs: 21 %
MCH: 31.8 pg (ref 26.6–33.0)
MCHC: 34.5 g/dL (ref 31.5–35.7)
MCV: 92 fL (ref 79–97)
Monocytes Absolute: 0.7 10*3/uL (ref 0.1–0.9)
Monocytes: 11 %
Neutrophils Absolute: 4.3 10*3/uL (ref 1.4–7.0)
Neutrophils: 65 %
Platelets: 346 10*3/uL (ref 150–450)
RBC: 4.06 x10E6/uL — ABNORMAL LOW (ref 4.14–5.80)
RDW: 11.5 % — ABNORMAL LOW (ref 11.6–15.4)
WBC: 6.6 10*3/uL (ref 3.4–10.8)

## 2023-07-29 LAB — CMP14+EGFR
ALT: 23 IU/L (ref 0–44)
AST: 25 IU/L (ref 0–40)
Albumin: 4.4 g/dL (ref 3.8–4.8)
Alkaline Phosphatase: 143 IU/L — ABNORMAL HIGH (ref 44–121)
BUN/Creatinine Ratio: 11 (ref 10–24)
BUN: 9 mg/dL (ref 8–27)
Bilirubin Total: 0.5 mg/dL (ref 0.0–1.2)
CO2: 19 mmol/L — ABNORMAL LOW (ref 20–29)
Calcium: 9.5 mg/dL (ref 8.6–10.2)
Chloride: 89 mmol/L — ABNORMAL LOW (ref 96–106)
Creatinine, Ser: 0.81 mg/dL (ref 0.76–1.27)
Globulin, Total: 2.2 g/dL (ref 1.5–4.5)
Glucose: 92 mg/dL (ref 70–99)
Potassium: 4.8 mmol/L (ref 3.5–5.2)
Sodium: 125 mmol/L — ABNORMAL LOW (ref 134–144)
Total Protein: 6.6 g/dL (ref 6.0–8.5)
eGFR: 92 mL/min/{1.73_m2} (ref >59)

## 2023-07-29 LAB — LIPID PANEL
Chol/HDL Ratio: 2.3 ratio (ref 0.0–5.0)
Cholesterol, Total: 182 mg/dL (ref 100–199)
HDL: 79 mg/dL (ref >39)
LDL Chol Calc (NIH): 91 mg/dL (ref 0–99)
Triglycerides: 66 mg/dL (ref 0–149)
VLDL Cholesterol Cal: 12 mg/dL (ref 5–40)

## 2023-07-29 LAB — THYROID PANEL WITH TSH
Free Thyroxine Index: 1.8 (ref 1.2–4.9)
T3 Uptake Ratio: 23 % — ABNORMAL LOW (ref 24–39)
T4, Total: 7.9 ug/dL (ref 4.5–12.0)
TSH: 1.75 u[IU]/mL (ref 0.450–4.500)

## 2023-07-29 LAB — VITAMIN D 25 HYDROXY (VIT D DEFICIENCY, FRACTURES): Vit D, 25-Hydroxy: 77.7 ng/mL (ref 30.0–100.0)

## 2023-07-29 LAB — HEPATITIS C ANTIBODY: Hep C Virus Ab: NONREACTIVE

## 2023-08-04 LAB — SPECIMEN STATUS REPORT

## 2023-08-04 LAB — ALKALINE PHOSPHATASE, ISOENZYMES
Alkaline Phosphatase: 140 IU/L — ABNORMAL HIGH (ref 44–121)
BONE FRACTION: 36 % (ref 12–68)
INTESTINAL FRAC.: 9 % (ref 0–18)
LIVER FRACTION: 55 % (ref 13–88)

## 2023-09-29 ENCOUNTER — Other Ambulatory Visit: Payer: Self-pay | Admitting: Family Medicine

## 2023-09-29 DIAGNOSIS — I10 Essential (primary) hypertension: Secondary | ICD-10-CM

## 2023-09-29 MED ORDER — ENALAPRIL MALEATE 10 MG PO TABS
10.0000 mg | ORAL_TABLET | Freq: Two times a day (BID) | ORAL | 0 refills | Status: DC
Start: 2023-09-29 — End: 2024-01-16

## 2023-09-29 NOTE — Telephone Encounter (Unsigned)
 Copied from CRM [id number]. Topic: Clinical - Medication Refill >> Sep 29, 2023  9:37 AM Ivette P wrote: Medication: enalapril  (VASOTEC ) 10 MG tablet  Has the patient contacted their pharmacy? Yes (Agent: If no, request that the patient contact the pharmacy for the refill. If patient does not wish to contact the pharmacy document the reason why and proceed with request.) (Agent: If yes, when and what did the pharmacy advise?)  This is the patient's preferred pharmacy:  [id number] - [place], [place] - [address] [place] [address] Phone: [phone] Fax: [phone]  Is this the correct pharmacy for this prescription? Yes If no, delete pharmacy and type the correct one.   Has the prescription been filled recently? No, 07/11/2023  Is the patient out of the medication? Yes  Has the patient been seen for an appointment in the last year OR does the patient have an upcoming appointment? Yes  Can we respond through MyChart? Yes  Agent: Please be advised that Rx refills may take up to 3 business days. We ask that you follow-up with your pharmacy.

## 2023-10-11 ENCOUNTER — Telehealth: Payer: Self-pay

## 2023-10-11 DIAGNOSIS — M2170 Unequal limb length (acquired), unspecified site: Secondary | ICD-10-CM

## 2023-10-11 NOTE — Telephone Encounter (Signed)
 Copied from CRM [id number]. Topic: Referral - Question >> Oct 11, 2023  3:42 PM Sophia H wrote: Reason for CRM: Patient is calling in regarding a referral/rx for his shorter leg (lift for right foot). States he spoke with PCP and she said if he had a name for somewhere he'd like to get it from to let her know and she'd send the information over. Lift for right leg has to be written on paperwork. Please advise [phone] (patient), can lvm if no answer.  Unsure if this would be rx or referral. [facility] FAX # [phone]

## 2023-10-12 ENCOUNTER — Other Ambulatory Visit: Payer: Self-pay | Admitting: Family Medicine

## 2023-10-12 DIAGNOSIS — M2170 Unequal limb length (acquired), unspecified site: Secondary | ICD-10-CM

## 2023-10-12 NOTE — Telephone Encounter (Signed)
 Pt aware DME order printed and faxed to [facility].

## 2023-10-12 NOTE — Addendum Note (Signed)
 Addended by: RANDINE ARNULFO MATSU on: 10/12/2023 09:06 AM   Modules accepted: Orders

## 2023-11-23 ENCOUNTER — Encounter (INDEPENDENT_AMBULATORY_CARE_PROVIDER_SITE_OTHER): Payer: Medicare Other | Admitting: Ophthalmology

## 2023-12-06 ENCOUNTER — Encounter: Payer: Self-pay | Admitting: *Deleted

## 2024-01-08 ENCOUNTER — Encounter (INDEPENDENT_AMBULATORY_CARE_PROVIDER_SITE_OTHER): Admitting: Ophthalmology

## 2024-01-08 DIAGNOSIS — H43813 Vitreous degeneration, bilateral: Secondary | ICD-10-CM | POA: Diagnosis not present

## 2024-01-08 DIAGNOSIS — H353112 Nonexudative age-related macular degeneration, right eye, intermediate dry stage: Secondary | ICD-10-CM | POA: Diagnosis not present

## 2024-01-08 DIAGNOSIS — H35371 Puckering of macula, right eye: Secondary | ICD-10-CM | POA: Diagnosis not present

## 2024-01-08 DIAGNOSIS — H353121 Nonexudative age-related macular degeneration, left eye, early dry stage: Secondary | ICD-10-CM | POA: Diagnosis not present

## 2024-01-09 ENCOUNTER — Other Ambulatory Visit: Payer: Self-pay | Admitting: Family Medicine

## 2024-01-09 DIAGNOSIS — J301 Allergic rhinitis due to pollen: Secondary | ICD-10-CM

## 2024-01-12 ENCOUNTER — Other Ambulatory Visit: Payer: Self-pay | Admitting: Family Medicine

## 2024-01-12 DIAGNOSIS — I10 Essential (primary) hypertension: Secondary | ICD-10-CM

## 2024-01-12 DIAGNOSIS — R600 Localized edema: Secondary | ICD-10-CM

## 2024-01-15 ENCOUNTER — Telehealth: Payer: Self-pay | Admitting: Family Medicine

## 2024-01-15 NOTE — Telephone Encounter (Unsigned)
 Copied from CRM [id number]. Topic: General - Other >> Jan 15, 2024  9:09 AM Olam RAMAN wrote: Reason for CRM: pt stated pharmacy contacted clinic on pending prescritption, enalpril 10 mg and pt is out of his meds for BP PT IS OUT OF RX

## 2024-01-16 ENCOUNTER — Other Ambulatory Visit: Payer: Self-pay | Admitting: Family Medicine

## 2024-01-16 DIAGNOSIS — I10 Essential (primary) hypertension: Secondary | ICD-10-CM

## 2024-01-16 NOTE — Telephone Encounter (Signed)
 Aware refill sent in today

## 2024-01-25 ENCOUNTER — Ambulatory Visit: Admitting: Family Medicine

## 2024-01-26 ENCOUNTER — Encounter: Payer: Self-pay | Admitting: Family

## 2024-01-26 ENCOUNTER — Ambulatory Visit: Admitting: Family

## 2024-01-26 ENCOUNTER — Ambulatory Visit: Payer: Self-pay | Admitting: Family

## 2024-01-26 ENCOUNTER — Ambulatory Visit

## 2024-01-26 ENCOUNTER — Ambulatory Visit: Payer: Self-pay | Admitting: Family Medicine

## 2024-01-26 ENCOUNTER — Ambulatory Visit: Admitting: Family Medicine

## 2024-01-26 VITALS — BP 151/80 | HR 78 | Temp 98.0°F | Ht 69.0 in | Wt 124.8 lb

## 2024-01-26 DIAGNOSIS — R399 Unspecified symptoms and signs involving the genitourinary system: Secondary | ICD-10-CM | POA: Diagnosis not present

## 2024-01-26 DIAGNOSIS — R300 Dysuria: Secondary | ICD-10-CM | POA: Diagnosis not present

## 2024-01-26 LAB — URINALYSIS, COMPLETE
Bilirubin, UA: NEGATIVE
Glucose, UA: NEGATIVE
Ketones, UA: NEGATIVE
Leukocytes,UA: NEGATIVE
Nitrite, UA: NEGATIVE
Protein,UA: NEGATIVE
Specific Gravity, UA: <=1.005 — AB (ref 1.005–1.030)
Urobilinogen, Ur: 0.2 mg/dL (ref 0.2–1.0)
pH, UA: 7 (ref 5.0–7.5)

## 2024-01-26 LAB — MICROSCOPIC EXAMINATION
Bacteria, UA: NONE SEEN
Epithelial Cells (non renal): NONE SEEN /HPF (ref 0–10)
RBC, Urine: NONE SEEN /HPF (ref 0–2)
Renal Epithel, UA: NONE SEEN /HPF
WBC, UA: NONE SEEN /HPF (ref 0–5)
Yeast, UA: NONE SEEN

## 2024-01-26 MED ORDER — CEPHALEXIN 500 MG PO CAPS: 500.0000 mg | ORAL_CAPSULE | Freq: Two times a day (BID) | ORAL | 0 refills | Status: DC

## 2024-01-26 NOTE — Telephone Encounter (Signed)
 FYI Only or Action Required?: Action required by provider: request for appointment.  Patient was last seen in primary care on 07/28/2023 by Severa Rock HERO, FNP.  Called Nurse Triage reporting urinary symptoms.  Symptoms began several days ago.  Interventions attempted: Nothing.Possible blood in urine.  Symptoms are: gradually worsening.  Triage Disposition: See Physician Within 24 Hours  Patient/caregiver understands and will follow disposition?: Yes     Copied from CRM #[id number]. Topic: Clinical - Red Word Triage >> Jan 26, 2024  8:03 AM Ahlexyia S wrote: Red Word that prompted transfer to Nurse Triage: Pt stated that he noticed some burning when going to urinate. Then pt stated when he went to the bathroom this morning he also noticed pink like blood in urine. Pt has been going to the bathroom more frequently. Warm transferred to nurse triage. Answer Assessment - Initial Assessment Questions 1. SYMPTOM: What's the main symptom you're concerned about? (e.g., frequency, incontinence)     Burning, frequency 2. ONSET: When did the    start?     2 days 3. PAIN: Is there any pain? If Yes, ask: How bad is it? (Scale: 1-10; mild, moderate, severe)     severe 4. CAUSE: What do you think is causing the symptoms?     UTI 5. OTHER SYMPTOMS: Do you have any other symptoms? (e.g., blood in urine, fever, flank pain, pain with urination)     Blood  6. PREGNANCY: Is there any chance you are pregnant? When was your last menstrual period?     n/a  Protocols used: Urinary Symptoms-A-AH  Reason for Disposition  Urinating more frequently than usual (i.e., frequency) OR new-onset of the feeling of an urgent need to urinate (i.e., urgency)  Answer Assessment - Initial Assessment Questions 1. SYMPTOM: What's the main symptom you're concerned about? (e.g., frequency, incontinence)     Burning, frequency 2. ONSET: When did the    start?     2 days 3. PAIN: Is there any pain?  If Yes, ask: How bad is it? (Scale: 1-10; mild, moderate, severe)     severe 4. CAUSE: What do you think is causing the symptoms?     UTI 5. OTHER SYMPTOMS: Do you have any other symptoms? (e.g., blood in urine, fever, flank pain, pain with urination)     Blood  6. PREGNANCY: Is there any chance you are pregnant? When was your last menstrual period?     n/a  Protocols used: Urinary Symptoms-A-AH

## 2024-01-26 NOTE — Telephone Encounter (Signed)
 noted

## 2024-01-26 NOTE — Patient Instructions (Signed)
 Asymptomatic Bacteriuria Asymptomatic bacteriuria is when you have a lot of germs called bacteria in your pee (urine), but they don't cause symptoms.  You may not need treatment for this condition. But you may be more likely to get an infection in the future. What are the causes? You may get more germs in your pee because of: Germs going into your urinary tract. Your urinary tract includes your kidneys, ureters, bladder, and urethra. Germs can get into your urinary tract during sex. A blockage in your urinary tract. This may be from a kidney stone or from an abnormal growth of cells called a tumor. Bladder problems that stop the bladder from emptying. What increases the risk? You're more likely to get this condition if: You have diabetes. You're an older adult. You're even more likely to get it if you live in a long-term care facility. You're male. You're pregnant. You have kidney stones. You've had a kidney transplant. You have a soft tube called a catheter that drains your pee. What are the signs or symptoms? There are no symptoms. How is this diagnosed? This condition is diagnosed with a pee test. It may be found when a sample of pee is taken to treat another condition, such as a problem with your kidney. If you're in your first trimester of pregnancy, you may be screened for this condition. How is this treated? In most cases, treatment isn't needed. Treating the condition can lead to other problems, such as: A yeast infection. The growth of antibiotic-resistant bacteria. These are germs that don't respond to treatment. But some people need to take antibiotics to prevent a kidney infection. You may need treatment if: You're having a procedure that affects your urinary tract. You've had a kidney transplant. You're pregnant. A kidney infection during pregnancy can lead to: Early labor. Very low birth weight. Newborn death. Follow these instructions at home: Medicines Take your  medicines only as told. Take your antibiotics as told. Do not stop taking them even if you start to feel better. General instructions Closely watch your condition for any changes. Drink enough fluid to keep your pee pale yellow. Pee more often to keep your bladder empty. If you're male, keep the area around your vagina and butt clean. Wipe from front to back after you pee or poop. Use each tissue only once when you wipe. Contact a health care provider if: You have symptoms of an infection. These symptoms may include: A burning feeling, or pain when you pee. A strong need to pee. Peeing more often. Pee that turns discolored or cloudy. Blood in your pee. Pee that smells bad or odd. A fever or chills. You have very bad pain in your back or lower belly. You faint. This information is not intended to replace advice given to you by your health care provider. Make sure you discuss any questions you have with your health care provider. Document Revised: 07/26/2022 Document Reviewed: 07/26/2022 Elsevier Patient Education  2024 [employer].

## 2024-01-26 NOTE — Progress Notes (Signed)
 Subjective:    Patient ID: Wesley Fowler., male    DOB: 1946/03/10, 78 y.o.   MRN: [id number]  Chief Complaint  Patient presents with   Urinary Tract Infection   Pt presents to the office today with dysuria and urinary frequency that started 2-3 days.   He took Keflex  500 mg BID yesterday that his wife had left over. Reports his symptoms have improved slightly.  Dysuria  This is a new problem. The current episode started yesterday. The problem occurs intermittently. The problem has been gradually worsening. The quality of the pain is described as burning. The pain is at a severity of 3/10. There has been no fever. Associated symptoms include frequency, hematuria, hesitancy and urgency. Pertinent negatives include no flank pain, nausea or vomiting. He has tried increased fluids for the symptoms. The treatment provided mild relief.      Review of Systems  Gastrointestinal:  Negative for nausea and vomiting.  Genitourinary:  Positive for dysuria, frequency, hematuria, hesitancy and urgency. Negative for flank pain.  All other systems reviewed and are negative.   Social History   Socioeconomic History   Marital status: Married    Spouse name: Not on file   Number of children: Not on file   Years of education: Not on file   Highest education level: Not on file  Occupational History   Not on file  Tobacco Use   Smoking status: Former   Smokeless tobacco: Never   Tobacco comments:    quit 1982  Vaping Use   Vaping status: Never Used  Substance and Sexual Activity   Alcohol use: No   Drug use: No   Sexual activity: Never  Other Topics Concern   Not on file  Social History Narrative   Not on file   Social Drivers of Health   Financial Resource Strain: Not on file  Food Insecurity: Not on file  Transportation Needs: Not on file  Physical Activity: Not on file  Stress: Not on file  Social Connections: Not on file   Family History  Problem Relation Age of Onset    Lung cancer Maternal Grandfather         Objective:   Physical Exam Vitals reviewed.  Constitutional:      General: He is not in acute distress.    Appearance: He is well-developed.  HENT:     Head: Normocephalic.     Right Ear: Tympanic membrane normal.     Left Ear: Tympanic membrane normal.  Eyes:     General:        Right eye: No discharge.        Left eye: No discharge.     Pupils: Pupils are equal, round, and reactive to light.  Neck:     Thyroid : No thyromegaly.  Cardiovascular:     Rate and Rhythm: Normal rate and regular rhythm.     Heart sounds: Normal heart sounds. No murmur heard. Pulmonary:     Effort: Pulmonary effort is normal. No respiratory distress.     Breath sounds: Normal breath sounds. No wheezing.  Abdominal:     General: Bowel sounds are normal. There is no distension.     Palpations: Abdomen is soft.     Tenderness: There is no abdominal tenderness.  Musculoskeletal:        General: No tenderness. Normal range of motion.     Cervical back: Normal range of motion and neck supple.  Skin:    General: Skin  is warm and dry.     Findings: No erythema or rash.  Neurological:     Mental Status: He is alert and oriented to person, place, and time.     Cranial Nerves: No cranial nerve deficit.     Deep Tendon Reflexes: Reflexes are normal and symmetric.  Psychiatric:        Behavior: Behavior normal.        Thought Content: Thought content normal.        Judgment: Judgment normal.       BP (!) 151/80   Pulse 78   Temp 98 F (36.7 C)   Ht 5' 9 (1.753 m)   Wt 124 lb 12.8 oz (56.6 kg)   SpO2 98%   BMI 18.43 kg/m      Assessment & Plan:  Jeiden Daughtridge. comes in today with chief complaint of Urinary Tract Infection   Diagnosis and orders addressed:  1. Dysuria - Urinalysis, Complete  2. UTI symptoms (Primary) Pt has taken two doses of his wife's keflex . Unsure if his urinalysis is correct given that.  Pt will force fluids If  symptoms return or worsen over the weekend he will start Keflex  BID  Follow up if symptoms worsen or do not improve  - cephALEXin  (KEFLEX ) 500 MG capsule; Take 1 capsule (500 mg total) by mouth 2 (two) times daily.  Dispense: 14 capsule; Refill: 0       Bari Learn, FNP

## 2024-02-09 ENCOUNTER — Ambulatory Visit: Payer: Self-pay | Admitting: Family Medicine

## 2024-02-09 ENCOUNTER — Encounter: Payer: Self-pay | Admitting: Family Medicine

## 2024-02-09 ENCOUNTER — Telehealth: Payer: Self-pay | Admitting: Family Medicine

## 2024-02-09 VITALS — BP 122/74 | HR 81 | Temp 97.5°F | Ht 69.0 in | Wt 126.2 lb

## 2024-02-09 DIAGNOSIS — K219 Gastro-esophageal reflux disease without esophagitis: Secondary | ICD-10-CM

## 2024-02-09 DIAGNOSIS — I10 Essential (primary) hypertension: Secondary | ICD-10-CM | POA: Diagnosis not present

## 2024-02-09 DIAGNOSIS — F419 Anxiety disorder, unspecified: Secondary | ICD-10-CM | POA: Diagnosis not present

## 2024-02-09 DIAGNOSIS — E871 Hypo-osmolality and hyponatremia: Secondary | ICD-10-CM

## 2024-02-09 DIAGNOSIS — F321 Major depressive disorder, single episode, moderate: Secondary | ICD-10-CM | POA: Diagnosis not present

## 2024-02-09 DIAGNOSIS — F330 Major depressive disorder, recurrent, mild: Secondary | ICD-10-CM

## 2024-02-09 DIAGNOSIS — I340 Nonrheumatic mitral (valve) insufficiency: Secondary | ICD-10-CM

## 2024-02-09 DIAGNOSIS — E559 Vitamin D deficiency, unspecified: Secondary | ICD-10-CM

## 2024-02-09 LAB — LIPID PANEL

## 2024-02-09 MED ORDER — ENALAPRIL MALEATE 10 MG PO TABS: 10.0000 mg | ORAL_TABLET | Freq: Two times a day (BID) | ORAL | 2 refills | Status: AC

## 2024-02-09 MED ORDER — FUROSEMIDE 20 MG PO TABS: 20.0000 mg | ORAL_TABLET | Freq: Every day | ORAL | 3 refills | Status: AC

## 2024-02-09 MED ORDER — BUPROPION HCL ER (XL) 150 MG PO TB24: 300.0000 mg | ORAL_TABLET | Freq: Every day | ORAL | 2 refills | Status: AC

## 2024-02-09 MED ORDER — FAMOTIDINE 20 MG PO TABS: 20.0000 mg | ORAL_TABLET | Freq: Two times a day (BID) | ORAL | 2 refills | Status: AC

## 2024-02-09 MED ORDER — BUSPIRONE HCL 5 MG PO TABS: 5.0000 mg | ORAL_TABLET | Freq: Two times a day (BID) | ORAL | 2 refills | Status: AC

## 2024-02-09 NOTE — Telephone Encounter (Signed)
 Called and spoke with patient and made him aware. He voiced understanding.

## 2024-02-09 NOTE — Telephone Encounter (Signed)
 Copied from CRM [id number]. Topic: Clinical - Medication Question >> Feb 09, 2024 11:34 AM Chiquita SQUIBB wrote: Reason for CRM: Patient was told to contact the office back when he got home to let the provider know that he currently takes furosemide   20 MG, she stated she would call that into [facility] for 90 days.

## 2024-02-09 NOTE — Progress Notes (Signed)
 Subjective:  Patient ID: Wesley Fowler., male    DOB: 08-11-1945, 78 y.o.   MRN: [id number]  Patient Care Team: Severa Rock HERO, FNP as PCP - General (Family Medicine)   Chief Complaint:  Medical Management of Chronic Issues (6 month follow up )   HPI: Wesley Fowler. is a 78 y.o. male presenting on 02/09/2024 for Medical Management of Chronic Issues (6 month follow up )   He is a 78 year old male with hypertension who presents for a routine follow-up.  Hypertension - Blood pressure readings at home typically range from 118-130 mmHg systolic and 71-80 mmHg diastolic - Recent blood pressure reading of 122/74 mmHg - Currently taking enalapril  20 mg daily, divided into two doses of 10 mg each - Takes a diuretic in the morning, uncertain of the specific medication - No headaches, chest pain, or leg swelling  Anxiety symptoms - Manages anxiety with Wellbutrin  and Buspar  - Buspar  taken as needed, sometimes going two to three days without a dose - Symptoms are well-controlled with current regimen  Gastroesophageal reflux symptoms - Takes famotidine  for acid reflux - Famotidine  is effective in controlling symptoms  Vitamin d  supplementation - Takes vitamin D  every other day - Previously took vitamin D  daily, which led to elevated levels that have since normalized          Relevant past medical, surgical, family, and social history reviewed and updated as indicated.  Allergies and medications reviewed and updated. Data reviewed: Chart in Epic.   Past Medical History:  Diagnosis Date   Anxiety    Arthritis    Glaucoma    History of high blood pressure    Hypertension     Past Surgical History:  Procedure Laterality Date   KNEE ARTHROSCOPY  04/04/2002   right   KNEE SURGERY Bilateral 2024   LAPAROSCOPIC INGUINAL HERNIA REPAIR  03/15/2011   bilateral   RETINAL DETACHMENT SURGERY  04/04/2002   right    Social History   Socioeconomic History   Marital  status: Married    Spouse name: Not on file   Number of children: Not on file   Years of education: Not on file   Highest education level: Not on file  Occupational History   Not on file  Tobacco Use   Smoking status: Former   Smokeless tobacco: Never   Tobacco comments:    quit 1982  Vaping Use   Vaping status: Never Used  Substance and Sexual Activity   Alcohol use: No   Drug use: No   Sexual activity: Never  Other Topics Concern   Not on file  Social History Narrative   Not on file   Social Drivers of Health   Financial Resource Strain: Not on file  Food Insecurity: Not on file  Transportation Needs: Not on file  Physical Activity: Not on file  Stress: Not on file  Social Connections: Not on file  Intimate Partner Violence: Not on file    Outpatient Encounter Medications as of 02/09/2024  Medication Sig   aspirin  EC 325 MG tablet Take 81 mg by mouth daily. Patient takes sporadically   latanoprost (XALATAN) 0.005 % ophthalmic solution    levocetirizine (XYZAL ) 5 MG tablet TAKE 1 TABLET BY MOUTH ONCE DAILY IN THE EVENING   meclizine  (ANTIVERT ) 25 MG tablet TAKE 1 TABLET BY MOUTH THREE TIMES DAILY AS NEEDED FOR DIZZINESS   Multiple Vitamin (MULTIVITAMIN WITH MINERALS) TABS tablet Take 1 tablet by  mouth daily.   Multiple Vitamins-Minerals (EYE VITAMINS PO) Take 1 tablet by mouth daily.   buPROPion  (WELLBUTRIN  XL) 150 MG 24 hr tablet Take 2 tablets (300 mg total) by mouth daily.   busPIRone  (BUSPAR ) 5 MG tablet Take 1 tablet (5 mg total) by mouth 2 (two) times daily.   enalapril  (VASOTEC ) 10 MG tablet Take 1 tablet (10 mg total) by mouth 2 (two) times daily.   famotidine  (PEPCID ) 20 MG tablet Take 1 tablet (20 mg total) by mouth 2 (two) times daily.   [DISCONTINUED] buPROPion  (WELLBUTRIN  XL) 150 MG 24 hr tablet Take 2 tablets (300 mg total) by mouth daily.   [DISCONTINUED] busPIRone  (BUSPAR ) 5 MG tablet Take 1 tablet (5 mg total) by mouth 2 (two) times daily.    [DISCONTINUED] cephALEXin  (KEFLEX ) 500 MG capsule Take 1 capsule (500 mg total) by mouth 2 (two) times daily.   [DISCONTINUED] enalapril  (VASOTEC ) 10 MG tablet Take 1 tablet by mouth twice daily   [DISCONTINUED] famotidine  (PEPCID ) 20 MG tablet Take 1 tablet (20 mg total) by mouth 2 (two) times daily.   [DISCONTINUED] triamcinolone  cream (KENALOG ) 0.1 % Apply 1 Application topically 2 (two) times daily.   No facility-administered encounter medications on file as of 02/09/2024.    Allergies  Allergen Reactions   Metronidazole  Other (See Comments)    GI upset  metronidazole    Oxycodone -Acetaminophen     Percocet [Oxycodone -Acetaminophen ] Nausea And Vomiting   Statins    Sulfa Antibiotics Nausea Only and Other (See Comments)    Pertinent ROS per HPI, otherwise unremarkable      Objective:  BP 122/74 Comment: home reading  Pulse 81   Temp (!) 97.5 F (36.4 C)   Ht 5' 9 (1.753 m)   Wt 126 lb 3.2 oz (57.2 kg)   SpO2 100%   BMI 18.64 kg/m    Wt Readings from Last 3 Encounters:  02/09/24 126 lb 3.2 oz (57.2 kg)  01/26/24 124 lb 12.8 oz (56.6 kg)  07/28/23 123 lb 3.2 oz (55.9 kg)    Physical Exam Vitals and nursing note reviewed.  Constitutional:      General: He is not in acute distress.    Appearance: Normal appearance. He is normal weight. He is not ill-appearing, toxic-appearing or diaphoretic.  HENT:     Head: Normocephalic and atraumatic.     Nose: Nose normal.     Mouth/Throat:     Mouth: Mucous membranes are moist.  Eyes:     Pupils: Pupils are equal, round, and reactive to light.  Cardiovascular:     Rate and Rhythm: Normal rate and regular rhythm.     Heart sounds: Murmur heard.     Systolic murmur is present with a grade of 1/6.     No friction rub. No gallop.  Pulmonary:     Effort: Pulmonary effort is normal.     Breath sounds: Normal breath sounds.  Musculoskeletal:     Cervical back: Neck supple.     Right lower leg: No edema.     Left lower leg:  No edema.     Comments: RLE shorter than left, chronic  Skin:    General: Skin is warm and dry.     Capillary Refill: Capillary refill takes less than 2 seconds.  Neurological:     General: No focal deficit present.     Mental Status: He is alert and oriented to person, place, and time.  Psychiatric:        Mood  and Affect: Mood normal.        Behavior: Behavior normal.        Thought Content: Thought content normal.        Judgment: Judgment normal.       Results for orders placed or performed in visit on 01/26/24  Microscopic Examination   Collection Time: 01/26/24 11:49 AM   Urine  Result Value Ref Range   WBC, UA None seen 0 - 5 /hpf   RBC, Urine None seen 0 - 2 /hpf   Epithelial Cells (non renal) None seen 0 - 10 /hpf   Renal Epithel, UA None seen None seen /hpf   Bacteria, UA None seen None seen/Few   Yeast, UA None seen None seen  Urinalysis, Complete   Collection Time: 01/26/24 11:49 AM  Result Value Ref Range   Specific Gravity, UA      <=1.005 (A) 1.005 - 1.030   pH, UA 7.0 5.0 - 7.5   Color, UA Yellow Yellow   Appearance Ur Clear Clear   Leukocytes,UA Negative Negative   Protein,UA Negative Negative/Trace   Glucose, UA Negative Negative   Ketones, UA Negative Negative   RBC, UA Trace (A) Negative   Bilirubin, UA Negative Negative   Urobilinogen, Ur 0.2 0.2 - 1.0 mg/dL   Nitrite, UA Negative Negative   Microscopic Examination See below:        Pertinent labs & imaging results that were available during my care of the patient were reviewed by me and considered in my medical decision making.  Assessment & Plan:  Trai was seen today for medical management of chronic issues.  Diagnoses and all orders for this visit:  Gastroesophageal reflux disease without esophagitis -     CBC with Differential/Platelet -     CMP14+EGFR -     famotidine  (PEPCID ) 20 MG tablet; Take 1 tablet (20 mg total) by mouth 2 (two) times daily.  Primary hypertension -     CBC  with Differential/Platelet -     CMP14+EGFR -     enalapril  (VASOTEC ) 10 MG tablet; Take 1 tablet (10 mg total) by mouth 2 (two) times daily. -     Lipid panel  Anxiety -     CBC with Differential/Platelet -     CMP14+EGFR -     busPIRone  (BUSPAR ) 5 MG tablet; Take 1 tablet (5 mg total) by mouth 2 (two) times daily. -     buPROPion  (WELLBUTRIN  XL) 150 MG 24 hr tablet; Take 2 tablets (300 mg total) by mouth daily.  Depression, major, single episode, moderate (HCC) -     CBC with Differential/Platelet -     CMP14+EGFR -     busPIRone  (BUSPAR ) 5 MG tablet; Take 1 tablet (5 mg total) by mouth 2 (two) times daily. -     buPROPion  (WELLBUTRIN  XL) 150 MG 24 hr tablet; Take 2 tablets (300 mg total) by mouth daily.  Vitamin D  deficiency -     CMP14+EGFR -     VITAMIN D  25 Hydroxy (Vit-D Deficiency, Fractures)  Non-rheumatic mitral regurgitation -     CBC with Differential/Platelet -     CMP14+EGFR -     Lipid panel  Chronic hyponatremia -     CMP14+EGFR      Essential hypertension Blood pressure is well-controlled at home with readings between 118-130/70-80 mmHg. No symptoms such as headaches, chest pain, or leg swelling. Current regimen includes enalapril  20 mg twice daily and a diuretic, possibly furosemide   20 mg, which was previously stopped due to side effects but may have been restarted. Diuretics help manage blood pressure by reducing fluid volume and arterial pressure. - Confirm the current diuretic medication and dosage. - Continue enalapril  20 mg twice daily. - Monitor blood pressure regularly at home.  Chronic hyponatremia Chronic low sodium levels with potential for significant symptoms if levels drop further. - Rechecked sodium levels today.  Anxiety and depression Symptoms are well-managed with Wellbutrin  and Buspar . Buspar  is taken as needed, sometimes every two to three days. - Continue Wellbutrin  and Buspar  as needed.  Gastroesophageal reflux disease Symptoms are  well-controlled with famotidine . - Continue famotidine  as needed.  Vitamin D  deficiency Vitamin D  levels were previously high with daily supplementation, now adjusted to every other day, resulting in normal levels. - Continue vitamin D  supplementation every other day.  General Health Maintenance Routine health maintenance includes monitoring cholesterol levels and vitamin D  status. - Checked cholesterol levels today. - Continue routine health maintenance.        I spent 40 minutes dedicated to the care of this patient on the date of this encounter to include pre-visit review of records including diagnostic studies and labs, face-to-face time with the patient discussing above conditions, post visit ordering of testing, clinical documentation in the electronic health record, making appropriate referrals as documented, and communicating necessary information to the patient's healthcare team.    Continue all other maintenance medications.  Follow up plan: Return in 1 year (on 02/08/2025), or if symptoms worsen or fail to improve, for Annual Physical.   Continue healthy lifestyle choices, including diet (rich in fruits, vegetables, and lean proteins, and low in salt and simple carbohydrates) and exercise (at least 30 minutes of moderate physical activity daily).   The above assessment and management plan was discussed with the patient. The patient verbalized understanding of and has agreed to the management plan. Patient is aware to call the clinic if they develop any new symptoms or if symptoms persist or worsen. Patient is aware when to return to the clinic for a follow-up visit. Patient educated on when it is appropriate to go to the emergency department.   Rosaline Bruns, FNP-C [facility] [phone]

## 2024-02-09 NOTE — Addendum Note (Signed)
 Addended by: SEVERA ROCK HERO on: 02/09/2024 12:54 PM   Modules accepted: Orders

## 2024-02-10 LAB — CBC WITH DIFFERENTIAL/PLATELET
Basophils Absolute: 0.1 x10E3/uL (ref 0.0–0.2)
Basos: 1 %
EOS (ABSOLUTE): 0.1 x10E3/uL (ref 0.0–0.4)
Eos: 2 %
Hematocrit: 38.2 % (ref 37.5–51.0)
Hemoglobin: 12.6 g/dL — ABNORMAL LOW (ref 13.0–17.7)
Immature Grans (Abs): 0 x10E3/uL (ref 0.0–0.1)
Immature Granulocytes: 0 %
Lymphocytes Absolute: 1.4 x10E3/uL (ref 0.7–3.1)
Lymphs: 20 %
MCH: 31.7 pg (ref 26.6–33.0)
MCHC: 33 g/dL (ref 31.5–35.7)
MCV: 96 fL (ref 79–97)
Monocytes Absolute: 0.7 x10E3/uL (ref 0.1–0.9)
Monocytes: 10 %
Neutrophils Absolute: 4.9 x10E3/uL (ref 1.4–7.0)
Neutrophils: 67 %
Platelets: 318 x10E3/uL (ref 150–450)
RBC: 3.98 x10E6/uL — ABNORMAL LOW (ref 4.14–5.80)
RDW: 11.8 % (ref 11.6–15.4)
WBC: 7.2 x10E3/uL (ref 3.4–10.8)

## 2024-02-10 LAB — CMP14+EGFR
ALT: 21 IU/L (ref 0–44)
AST: 25 IU/L (ref 0–40)
Albumin: 4.2 g/dL (ref 3.8–4.8)
Alkaline Phosphatase: 113 IU/L (ref 47–123)
BUN/Creatinine Ratio: 10 (ref 10–24)
BUN: 9 mg/dL (ref 8–27)
Bilirubin Total: 0.5 mg/dL (ref 0.0–1.2)
CO2: 23 mmol/L (ref 20–29)
Calcium: 9 mg/dL (ref 8.6–10.2)
Chloride: 100 mmol/L (ref 96–106)
Creatinine, Ser: 0.87 mg/dL (ref 0.76–1.27)
Globulin, Total: 2 g/dL (ref 1.5–4.5)
Glucose: 90 mg/dL (ref 70–99)
Potassium: 4.4 mmol/L (ref 3.5–5.2)
Sodium: 137 mmol/L (ref 134–144)
Total Protein: 6.2 g/dL (ref 6.0–8.5)
eGFR: 89 mL/min/1.73 (ref >59)

## 2024-02-10 LAB — LIPID PANEL
Chol/HDL Ratio: 2.5 ratio (ref 0.0–5.0)
Cholesterol, Total: 181 mg/dL (ref 100–199)
HDL: 71 mg/dL (ref >39)
LDL Chol Calc (NIH): 98 mg/dL (ref 0–99)
Triglycerides: 66 mg/dL (ref 0–149)
VLDL Cholesterol Cal: 12 mg/dL (ref 5–40)

## 2024-02-10 LAB — VITAMIN D 25 HYDROXY (VIT D DEFICIENCY, FRACTURES): Vit D, 25-Hydroxy: 74.5 ng/mL (ref 30.0–100.0)

## 2024-02-11 ENCOUNTER — Ambulatory Visit: Payer: Self-pay | Admitting: Family Medicine

## 2025-01-07 ENCOUNTER — Encounter (INDEPENDENT_AMBULATORY_CARE_PROVIDER_SITE_OTHER): Admitting: Ophthalmology

## 2025-02-11 ENCOUNTER — Encounter: Admitting: Family Medicine
